# Patient Record
Sex: Female | Born: 1935 | Race: Black or African American | Hispanic: No | Marital: Single | Smoking: Never smoker
Health system: Southern US, Community
[De-identification: ages and names within clinical notes are randomized; demographics above are authoritative.]

## PROBLEM LIST (undated history)

## (undated) DIAGNOSIS — F09 Unspecified mental disorder due to known physiological condition: Secondary | ICD-10-CM

## (undated) DIAGNOSIS — I10 Essential (primary) hypertension: Secondary | ICD-10-CM

## (undated) DIAGNOSIS — I2510 Atherosclerotic heart disease of native coronary artery without angina pectoris: Secondary | ICD-10-CM

## (undated) DIAGNOSIS — D649 Anemia, unspecified: Secondary | ICD-10-CM

## (undated) DIAGNOSIS — F32A Depression, unspecified: Secondary | ICD-10-CM

## (undated) DIAGNOSIS — R55 Syncope and collapse: Secondary | ICD-10-CM

## (undated) DIAGNOSIS — F329 Major depressive disorder, single episode, unspecified: Secondary | ICD-10-CM

## (undated) DIAGNOSIS — C189 Malignant neoplasm of colon, unspecified: Secondary | ICD-10-CM

## (undated) DIAGNOSIS — E039 Hypothyroidism, unspecified: Secondary | ICD-10-CM

## (undated) DIAGNOSIS — I499 Cardiac arrhythmia, unspecified: Secondary | ICD-10-CM

## (undated) HISTORY — PX: COLON SURGERY: SHX602

## (undated) HISTORY — PX: CORONARY ARTERY BYPASS GRAFT: SHX141

## (undated) HISTORY — DX: Malignant neoplasm of colon, unspecified: C18.9

---

## 1987-09-10 DIAGNOSIS — C189 Malignant neoplasm of colon, unspecified: Secondary | ICD-10-CM

## 1987-09-10 HISTORY — DX: Malignant neoplasm of colon, unspecified: C18.9

## 2001-01-05 ENCOUNTER — Ambulatory Visit (HOSPITAL_COMMUNITY): Admission: RE | Admit: 2001-01-05 | Discharge: 2001-01-06 | Payer: Self-pay | Admitting: Ophthalmology

## 2001-02-06 ENCOUNTER — Emergency Department (HOSPITAL_COMMUNITY): Admission: EM | Admit: 2001-02-06 | Discharge: 2001-02-06 | Payer: Self-pay | Admitting: Emergency Medicine

## 2001-02-06 ENCOUNTER — Encounter: Payer: Self-pay | Admitting: *Deleted

## 2001-03-10 ENCOUNTER — Other Ambulatory Visit: Admission: RE | Admit: 2001-03-10 | Discharge: 2001-03-10 | Payer: Self-pay | Admitting: Family Medicine

## 2001-03-10 ENCOUNTER — Ambulatory Visit (HOSPITAL_COMMUNITY): Admission: RE | Admit: 2001-03-10 | Discharge: 2001-03-10 | Payer: Self-pay | Admitting: Family Medicine

## 2001-03-10 ENCOUNTER — Encounter: Payer: Self-pay | Admitting: Family Medicine

## 2001-04-24 ENCOUNTER — Ambulatory Visit (HOSPITAL_COMMUNITY): Admission: RE | Admit: 2001-04-24 | Discharge: 2001-04-24 | Payer: Self-pay | Admitting: Internal Medicine

## 2001-05-05 ENCOUNTER — Encounter (HOSPITAL_COMMUNITY): Admission: RE | Admit: 2001-05-05 | Discharge: 2001-06-04 | Payer: Self-pay | Admitting: Oncology

## 2001-05-05 ENCOUNTER — Encounter: Admission: RE | Admit: 2001-05-05 | Discharge: 2001-05-05 | Payer: Self-pay | Admitting: Oncology

## 2001-05-08 ENCOUNTER — Inpatient Hospital Stay (HOSPITAL_COMMUNITY): Admission: RE | Admit: 2001-05-08 | Discharge: 2001-05-09 | Payer: Self-pay | Admitting: Family Medicine

## 2001-05-14 ENCOUNTER — Inpatient Hospital Stay (HOSPITAL_COMMUNITY): Admission: AD | Admit: 2001-05-14 | Discharge: 2001-05-31 | Payer: Self-pay | Admitting: *Deleted

## 2001-05-14 ENCOUNTER — Encounter: Payer: Self-pay | Admitting: Cardiothoracic Surgery

## 2001-05-15 ENCOUNTER — Encounter: Payer: Self-pay | Admitting: Cardiothoracic Surgery

## 2001-05-16 ENCOUNTER — Encounter: Payer: Self-pay | Admitting: Cardiothoracic Surgery

## 2001-05-17 ENCOUNTER — Encounter: Payer: Self-pay | Admitting: Surgery

## 2001-05-18 ENCOUNTER — Encounter: Payer: Self-pay | Admitting: Surgery

## 2001-05-19 ENCOUNTER — Encounter: Payer: Self-pay | Admitting: Cardiothoracic Surgery

## 2001-05-25 ENCOUNTER — Encounter: Payer: Self-pay | Admitting: Thoracic Surgery (Cardiothoracic Vascular Surgery)

## 2001-05-29 ENCOUNTER — Encounter: Payer: Self-pay | Admitting: Cardiology

## 2001-06-09 ENCOUNTER — Encounter: Payer: Self-pay | Admitting: *Deleted

## 2001-06-09 ENCOUNTER — Emergency Department (HOSPITAL_COMMUNITY): Admission: EM | Admit: 2001-06-09 | Discharge: 2001-06-09 | Payer: Self-pay | Admitting: *Deleted

## 2001-06-15 ENCOUNTER — Ambulatory Visit (HOSPITAL_COMMUNITY): Admission: RE | Admit: 2001-06-15 | Discharge: 2001-06-15 | Payer: Self-pay | Admitting: Cardiology

## 2001-07-28 ENCOUNTER — Encounter: Payer: Self-pay | Admitting: Family Medicine

## 2001-07-28 ENCOUNTER — Encounter: Payer: Self-pay | Admitting: Emergency Medicine

## 2001-07-28 ENCOUNTER — Emergency Department (HOSPITAL_COMMUNITY): Admission: EM | Admit: 2001-07-28 | Discharge: 2001-07-28 | Payer: Self-pay | Admitting: Emergency Medicine

## 2001-07-28 ENCOUNTER — Ambulatory Visit (HOSPITAL_COMMUNITY): Admission: RE | Admit: 2001-07-28 | Discharge: 2001-07-28 | Payer: Self-pay | Admitting: Family Medicine

## 2001-09-03 ENCOUNTER — Encounter: Payer: Self-pay | Admitting: *Deleted

## 2001-09-04 ENCOUNTER — Encounter: Payer: Self-pay | Admitting: Internal Medicine

## 2001-09-04 ENCOUNTER — Inpatient Hospital Stay (HOSPITAL_COMMUNITY): Admission: EM | Admit: 2001-09-04 | Discharge: 2001-09-23 | Payer: Self-pay | Admitting: Internal Medicine

## 2001-09-04 ENCOUNTER — Encounter: Payer: Self-pay | Admitting: *Deleted

## 2001-09-05 ENCOUNTER — Encounter: Payer: Self-pay | Admitting: Pulmonary Disease

## 2001-09-06 ENCOUNTER — Encounter: Payer: Self-pay | Admitting: Internal Medicine

## 2001-09-07 ENCOUNTER — Encounter: Payer: Self-pay | Admitting: Internal Medicine

## 2001-09-08 ENCOUNTER — Encounter: Payer: Self-pay | Admitting: Internal Medicine

## 2001-09-12 ENCOUNTER — Encounter: Payer: Self-pay | Admitting: Internal Medicine

## 2001-09-13 ENCOUNTER — Encounter: Payer: Self-pay | Admitting: Internal Medicine

## 2001-09-14 ENCOUNTER — Encounter: Payer: Self-pay | Admitting: Internal Medicine

## 2001-09-21 ENCOUNTER — Encounter: Payer: Self-pay | Admitting: Internal Medicine

## 2001-09-25 ENCOUNTER — Emergency Department (HOSPITAL_COMMUNITY): Admission: EM | Admit: 2001-09-25 | Discharge: 2001-09-25 | Payer: Self-pay | Admitting: Emergency Medicine

## 2001-09-25 ENCOUNTER — Encounter: Payer: Self-pay | Admitting: Internal Medicine

## 2002-03-26 ENCOUNTER — Ambulatory Visit (HOSPITAL_COMMUNITY): Admission: RE | Admit: 2002-03-26 | Discharge: 2002-03-26 | Payer: Self-pay | Admitting: Internal Medicine

## 2002-03-26 ENCOUNTER — Encounter: Payer: Self-pay | Admitting: Internal Medicine

## 2002-05-12 ENCOUNTER — Encounter: Admission: RE | Admit: 2002-05-12 | Discharge: 2002-05-12 | Payer: Self-pay | Admitting: Oncology

## 2002-05-12 ENCOUNTER — Encounter (HOSPITAL_COMMUNITY): Admission: RE | Admit: 2002-05-12 | Discharge: 2002-06-11 | Payer: Self-pay | Admitting: Oncology

## 2003-05-18 ENCOUNTER — Encounter (HOSPITAL_COMMUNITY): Admission: RE | Admit: 2003-05-18 | Discharge: 2003-06-09 | Payer: Self-pay | Admitting: Oncology

## 2003-05-18 ENCOUNTER — Encounter: Admission: RE | Admit: 2003-05-18 | Discharge: 2003-05-18 | Payer: Self-pay | Admitting: Oncology

## 2003-12-06 ENCOUNTER — Ambulatory Visit (HOSPITAL_COMMUNITY): Admission: RE | Admit: 2003-12-06 | Discharge: 2003-12-06 | Payer: Self-pay | Admitting: Internal Medicine

## 2004-06-10 ENCOUNTER — Emergency Department (HOSPITAL_COMMUNITY): Admission: EM | Admit: 2004-06-10 | Discharge: 2004-06-10 | Payer: Self-pay | Admitting: Emergency Medicine

## 2005-08-14 ENCOUNTER — Ambulatory Visit (HOSPITAL_COMMUNITY): Admission: RE | Admit: 2005-08-14 | Discharge: 2005-08-14 | Payer: Self-pay | Admitting: Internal Medicine

## 2005-11-18 ENCOUNTER — Ambulatory Visit (HOSPITAL_COMMUNITY): Admission: RE | Admit: 2005-11-18 | Discharge: 2005-11-18 | Payer: Self-pay | Admitting: Internal Medicine

## 2005-11-25 ENCOUNTER — Ambulatory Visit (HOSPITAL_COMMUNITY): Admission: RE | Admit: 2005-11-25 | Discharge: 2005-11-25 | Payer: Self-pay | Admitting: Orthopaedic Surgery

## 2006-03-24 ENCOUNTER — Emergency Department (HOSPITAL_COMMUNITY): Admission: EM | Admit: 2006-03-24 | Discharge: 2006-03-24 | Payer: Self-pay | Admitting: Physician Assistant

## 2006-04-30 ENCOUNTER — Ambulatory Visit (HOSPITAL_COMMUNITY): Admission: RE | Admit: 2006-04-30 | Discharge: 2006-04-30 | Payer: Self-pay | Admitting: Internal Medicine

## 2006-08-08 ENCOUNTER — Inpatient Hospital Stay (HOSPITAL_COMMUNITY): Admission: EM | Admit: 2006-08-08 | Discharge: 2006-08-11 | Payer: Self-pay | Admitting: Emergency Medicine

## 2006-12-31 ENCOUNTER — Ambulatory Visit (HOSPITAL_COMMUNITY): Admission: RE | Admit: 2006-12-31 | Discharge: 2006-12-31 | Payer: Self-pay | Admitting: Internal Medicine

## 2007-03-26 ENCOUNTER — Ambulatory Visit: Payer: Self-pay | Admitting: Cardiology

## 2007-03-26 ENCOUNTER — Inpatient Hospital Stay (HOSPITAL_COMMUNITY): Admission: EM | Admit: 2007-03-26 | Discharge: 2007-03-31 | Payer: Self-pay | Admitting: Emergency Medicine

## 2007-03-31 ENCOUNTER — Inpatient Hospital Stay
Admission: AD | Admit: 2007-03-31 | Discharge: 2011-04-01 | Disposition: A | Discharge status: Nursing Home (Includes ICF) | Payer: Self-pay | Attending: Internal Medicine | Admitting: Internal Medicine

## 2007-06-22 ENCOUNTER — Ambulatory Visit (HOSPITAL_COMMUNITY): Admission: RE | Admit: 2007-06-22 | Discharge: 2007-06-22 | Payer: Self-pay | Admitting: Internal Medicine

## 2007-12-29 ENCOUNTER — Ambulatory Visit (HOSPITAL_COMMUNITY): Admission: RE | Admit: 2007-12-29 | Discharge: 2007-12-29 | Payer: Self-pay | Admitting: Internal Medicine

## 2008-02-02 ENCOUNTER — Ambulatory Visit (HOSPITAL_COMMUNITY): Admission: RE | Admit: 2008-02-02 | Discharge: 2008-02-02 | Payer: Self-pay | Admitting: Internal Medicine

## 2008-08-06 ENCOUNTER — Emergency Department (HOSPITAL_COMMUNITY): Admission: EM | Admit: 2008-08-06 | Discharge: 2008-08-07 | Payer: Self-pay | Admitting: Emergency Medicine

## 2008-10-25 ENCOUNTER — Ambulatory Visit (HOSPITAL_COMMUNITY): Admission: RE | Admit: 2008-10-25 | Discharge: 2008-10-25 | Payer: Self-pay | Admitting: Internal Medicine

## 2008-12-06 DIAGNOSIS — E119 Type 2 diabetes mellitus without complications: Secondary | ICD-10-CM | POA: Insufficient documentation

## 2008-12-06 DIAGNOSIS — R570 Cardiogenic shock: Secondary | ICD-10-CM | POA: Insufficient documentation

## 2008-12-06 DIAGNOSIS — K219 Gastro-esophageal reflux disease without esophagitis: Secondary | ICD-10-CM

## 2008-12-06 DIAGNOSIS — Z8614 Personal history of Methicillin resistant Staphylococcus aureus infection: Secondary | ICD-10-CM | POA: Insufficient documentation

## 2008-12-06 DIAGNOSIS — F0390 Unspecified dementia without behavioral disturbance: Secondary | ICD-10-CM

## 2008-12-06 DIAGNOSIS — B20 Human immunodeficiency virus [HIV] disease: Secondary | ICD-10-CM | POA: Insufficient documentation

## 2008-12-06 DIAGNOSIS — N189 Chronic kidney disease, unspecified: Secondary | ICD-10-CM

## 2008-12-06 DIAGNOSIS — J9600 Acute respiratory failure, unspecified whether with hypoxia or hypercapnia: Secondary | ICD-10-CM | POA: Insufficient documentation

## 2008-12-06 DIAGNOSIS — D126 Benign neoplasm of colon, unspecified: Secondary | ICD-10-CM

## 2008-12-06 DIAGNOSIS — I2510 Atherosclerotic heart disease of native coronary artery without angina pectoris: Secondary | ICD-10-CM

## 2008-12-06 DIAGNOSIS — F0280 Dementia in other diseases classified elsewhere without behavioral disturbance: Secondary | ICD-10-CM

## 2008-12-06 DIAGNOSIS — I6350 Cerebral infarction due to unspecified occlusion or stenosis of unspecified cerebral artery: Secondary | ICD-10-CM | POA: Insufficient documentation

## 2008-12-06 DIAGNOSIS — Z8719 Personal history of other diseases of the digestive system: Secondary | ICD-10-CM | POA: Insufficient documentation

## 2008-12-06 DIAGNOSIS — C9110 Chronic lymphocytic leukemia of B-cell type not having achieved remission: Secondary | ICD-10-CM | POA: Insufficient documentation

## 2008-12-06 DIAGNOSIS — I69991 Dysphagia following unspecified cerebrovascular disease: Secondary | ICD-10-CM | POA: Insufficient documentation

## 2008-12-06 DIAGNOSIS — Z85038 Personal history of other malignant neoplasm of large intestine: Secondary | ICD-10-CM | POA: Insufficient documentation

## 2008-12-07 ENCOUNTER — Ambulatory Visit: Payer: Self-pay | Admitting: Gastroenterology

## 2008-12-27 ENCOUNTER — Ambulatory Visit: Payer: Self-pay | Admitting: Gastroenterology

## 2008-12-27 ENCOUNTER — Encounter: Payer: Self-pay | Admitting: Gastroenterology

## 2008-12-27 ENCOUNTER — Ambulatory Visit (HOSPITAL_COMMUNITY): Admission: RE | Admit: 2008-12-27 | Discharge: 2008-12-27 | Payer: Self-pay | Admitting: Gastroenterology

## 2008-12-27 HISTORY — PX: COLONOSCOPY: SHX174

## 2009-03-01 ENCOUNTER — Ambulatory Visit (HOSPITAL_COMMUNITY): Admission: RE | Admit: 2009-03-01 | Discharge: 2009-03-01 | Payer: Self-pay | Admitting: Internal Medicine

## 2009-08-24 ENCOUNTER — Ambulatory Visit (HOSPITAL_COMMUNITY): Admission: RE | Admit: 2009-08-24 | Discharge: 2009-08-24 | Payer: Self-pay | Admitting: Internal Medicine

## 2010-09-12 LAB — GLUCOSE, CAPILLARY
Glucose-Capillary: 84 mg/dL (ref 70–99)
Glucose-Capillary: 155 mg/dL — ABNORMAL HIGH (ref 70–99)

## 2010-09-13 LAB — GLUCOSE, CAPILLARY
Glucose-Capillary: 102 mg/dL — ABNORMAL HIGH (ref 70–99)
Glucose-Capillary: 191 mg/dL — ABNORMAL HIGH (ref 70–99)

## 2010-09-14 LAB — GLUCOSE, CAPILLARY: Glucose-Capillary: 87 mg/dL (ref 70–99)

## 2010-09-24 LAB — GLUCOSE, CAPILLARY
Glucose-Capillary: 130 mg/dL — ABNORMAL HIGH (ref 70–99)
Glucose-Capillary: 69 mg/dL — ABNORMAL LOW (ref 70–99)
Glucose-Capillary: 231 mg/dL — ABNORMAL HIGH (ref 70–99)
Glucose-Capillary: 208 mg/dL — ABNORMAL HIGH (ref 70–99)
Glucose-Capillary: 167 mg/dL — ABNORMAL HIGH (ref 70–99)
Glucose-Capillary: 150 mg/dL — ABNORMAL HIGH (ref 70–99)
Glucose-Capillary: 104 mg/dL — ABNORMAL HIGH (ref 70–99)
Glucose-Capillary: 89 mg/dL (ref 70–99)
Glucose-Capillary: 178 mg/dL — ABNORMAL HIGH (ref 70–99)
Glucose-Capillary: 82 mg/dL (ref 70–99)
Glucose-Capillary: 132 mg/dL — ABNORMAL HIGH (ref 70–99)
Glucose-Capillary: 102 mg/dL — ABNORMAL HIGH (ref 70–99)
Glucose-Capillary: 161 mg/dL — ABNORMAL HIGH (ref 70–99)
Glucose-Capillary: 107 mg/dL — ABNORMAL HIGH (ref 70–99)
Glucose-Capillary: 152 mg/dL — ABNORMAL HIGH (ref 70–99)
Glucose-Capillary: 80 mg/dL (ref 70–99)
Glucose-Capillary: 124 mg/dL — ABNORMAL HIGH (ref 70–99)
Glucose-Capillary: 72 mg/dL (ref 70–99)
Glucose-Capillary: 88 mg/dL (ref 70–99)
Glucose-Capillary: 135 mg/dL — ABNORMAL HIGH (ref 70–99)

## 2010-09-26 LAB — GLUCOSE, CAPILLARY
Glucose-Capillary: 75 mg/dL (ref 70–99)
Glucose-Capillary: 72 mg/dL (ref 70–99)
Glucose-Capillary: 66 mg/dL — ABNORMAL LOW (ref 70–99)
Glucose-Capillary: 188 mg/dL — ABNORMAL HIGH (ref 70–99)

## 2010-09-30 ENCOUNTER — Encounter (HOSPITAL_BASED_OUTPATIENT_CLINIC_OR_DEPARTMENT_OTHER): Payer: Self-pay | Admitting: Internal Medicine

## 2010-10-01 LAB — GLUCOSE, CAPILLARY
Glucose-Capillary: 102 mg/dL — ABNORMAL HIGH (ref 70–99)
Glucose-Capillary: 134 mg/dL — ABNORMAL HIGH (ref 70–99)

## 2010-10-02 LAB — GLUCOSE, CAPILLARY
Glucose-Capillary: 159 mg/dL — ABNORMAL HIGH (ref 70–99)
Glucose-Capillary: 104 mg/dL — ABNORMAL HIGH (ref 70–99)
Glucose-Capillary: 102 mg/dL — ABNORMAL HIGH (ref 70–99)

## 2010-10-03 LAB — GLUCOSE, CAPILLARY
Glucose-Capillary: 68 mg/dL — ABNORMAL LOW (ref 70–99)
Glucose-Capillary: 136 mg/dL — ABNORMAL HIGH (ref 70–99)

## 2010-10-04 LAB — GLUCOSE, CAPILLARY
Glucose-Capillary: 119 mg/dL — ABNORMAL HIGH (ref 70–99)
Glucose-Capillary: 133 mg/dL — ABNORMAL HIGH (ref 70–99)

## 2010-10-05 LAB — GLUCOSE, CAPILLARY: Glucose-Capillary: 86 mg/dL (ref 70–99)

## 2010-10-07 LAB — GLUCOSE, CAPILLARY: Glucose-Capillary: 147 mg/dL — ABNORMAL HIGH (ref 70–99)

## 2010-10-08 LAB — GLUCOSE, CAPILLARY
Glucose-Capillary: 78 mg/dL (ref 70–99)
Glucose-Capillary: 137 mg/dL — ABNORMAL HIGH (ref 70–99)

## 2010-10-09 LAB — GLUCOSE, CAPILLARY: Glucose-Capillary: 108 mg/dL — ABNORMAL HIGH (ref 70–99)

## 2010-10-11 LAB — GLUCOSE, CAPILLARY: Glucose-Capillary: 155 mg/dL — ABNORMAL HIGH (ref 70–99)

## 2010-10-12 LAB — GLUCOSE, CAPILLARY: Glucose-Capillary: 128 mg/dL — ABNORMAL HIGH (ref 70–99)

## 2010-10-14 LAB — GLUCOSE, CAPILLARY: Glucose-Capillary: 85 mg/dL (ref 70–99)

## 2010-10-15 LAB — GLUCOSE, CAPILLARY
Glucose-Capillary: 95 mg/dL (ref 70–99)
Glucose-Capillary: 147 mg/dL — ABNORMAL HIGH (ref 70–99)

## 2010-10-16 LAB — GLUCOSE, CAPILLARY: Glucose-Capillary: 112 mg/dL — ABNORMAL HIGH (ref 70–99)

## 2010-10-17 LAB — GLUCOSE, CAPILLARY
Glucose-Capillary: 105 mg/dL — ABNORMAL HIGH (ref 70–99)
Glucose-Capillary: 148 mg/dL — ABNORMAL HIGH (ref 70–99)

## 2010-10-18 LAB — GLUCOSE, CAPILLARY

## 2010-10-19 LAB — GLUCOSE, CAPILLARY
Glucose-Capillary: 93 mg/dL (ref 70–99)
Glucose-Capillary: 95 mg/dL (ref 70–99)

## 2010-10-20 LAB — GLUCOSE, CAPILLARY
Glucose-Capillary: 84 mg/dL (ref 70–99)
Glucose-Capillary: 168 mg/dL — ABNORMAL HIGH (ref 70–99)

## 2010-10-21 LAB — GLUCOSE, CAPILLARY: Glucose-Capillary: 152 mg/dL — ABNORMAL HIGH (ref 70–99)

## 2010-10-22 LAB — GLUCOSE, CAPILLARY: Glucose-Capillary: 90 mg/dL (ref 70–99)

## 2010-10-23 LAB — GLUCOSE, CAPILLARY: Glucose-Capillary: 104 mg/dL — ABNORMAL HIGH (ref 70–99)

## 2010-10-24 LAB — GLUCOSE, CAPILLARY: Glucose-Capillary: 95 mg/dL (ref 70–99)

## 2010-10-26 LAB — GLUCOSE, CAPILLARY: Glucose-Capillary: 110 mg/dL — ABNORMAL HIGH (ref 70–99)

## 2010-10-27 LAB — GLUCOSE, CAPILLARY: Glucose-Capillary: 102 mg/dL — ABNORMAL HIGH (ref 70–99)

## 2010-10-28 LAB — GLUCOSE, CAPILLARY: Glucose-Capillary: 92 mg/dL (ref 70–99)

## 2010-10-30 LAB — GLUCOSE, CAPILLARY: Glucose-Capillary: 174 mg/dL — ABNORMAL HIGH (ref 70–99)

## 2010-10-31 LAB — GLUCOSE, CAPILLARY: Glucose-Capillary: 250 mg/dL — ABNORMAL HIGH (ref 70–99)

## 2010-11-01 LAB — GLUCOSE, CAPILLARY: Glucose-Capillary: 107 mg/dL — ABNORMAL HIGH (ref 70–99)

## 2010-11-02 LAB — GLUCOSE, CAPILLARY

## 2010-11-03 LAB — GLUCOSE, CAPILLARY
Glucose-Capillary: 99 mg/dL (ref 70–99)
Glucose-Capillary: 147 mg/dL — ABNORMAL HIGH (ref 70–99)

## 2010-11-04 LAB — GLUCOSE, CAPILLARY: Glucose-Capillary: 191 mg/dL — ABNORMAL HIGH (ref 70–99)

## 2010-11-05 LAB — GLUCOSE, CAPILLARY: Glucose-Capillary: 122 mg/dL — ABNORMAL HIGH (ref 70–99)

## 2010-11-06 LAB — GLUCOSE, CAPILLARY
Glucose-Capillary: 114 mg/dL — ABNORMAL HIGH (ref 70–99)
Glucose-Capillary: 97 mg/dL (ref 70–99)

## 2010-11-07 LAB — GLUCOSE, CAPILLARY
Glucose-Capillary: 91 mg/dL (ref 70–99)
Glucose-Capillary: 187 mg/dL — ABNORMAL HIGH (ref 70–99)

## 2010-11-08 LAB — GLUCOSE, CAPILLARY: Glucose-Capillary: 177 mg/dL — ABNORMAL HIGH (ref 70–99)

## 2010-11-09 LAB — GLUCOSE, CAPILLARY: Glucose-Capillary: 104 mg/dL — ABNORMAL HIGH (ref 70–99)

## 2010-11-11 LAB — GLUCOSE, CAPILLARY
Glucose-Capillary: 84 mg/dL (ref 70–99)
Glucose-Capillary: 152 mg/dL — ABNORMAL HIGH (ref 70–99)
Glucose-Capillary: 157 mg/dL — ABNORMAL HIGH (ref 70–99)

## 2010-11-12 LAB — GLUCOSE, CAPILLARY: Glucose-Capillary: 129 mg/dL — ABNORMAL HIGH (ref 70–99)

## 2010-11-13 LAB — GLUCOSE, CAPILLARY
Glucose-Capillary: 75 mg/dL (ref 70–99)
Glucose-Capillary: 135 mg/dL — ABNORMAL HIGH (ref 70–99)

## 2010-11-19 ENCOUNTER — Ambulatory Visit (HOSPITAL_COMMUNITY): Payer: Medicare Other | Attending: Internal Medicine

## 2010-11-19 DIAGNOSIS — R932 Abnormal findings on diagnostic imaging of liver and biliary tract: Secondary | ICD-10-CM | POA: Insufficient documentation

## 2010-11-19 DIAGNOSIS — K859 Acute pancreatitis without necrosis or infection, unspecified: Secondary | ICD-10-CM | POA: Insufficient documentation

## 2010-11-19 LAB — GLUCOSE, CAPILLARY
Glucose-Capillary: 111 mg/dL — ABNORMAL HIGH (ref 70–99)
Glucose-Capillary: 117 mg/dL — ABNORMAL HIGH (ref 70–99)
Glucose-Capillary: 134 mg/dL — ABNORMAL HIGH (ref 70–99)
Glucose-Capillary: 229 mg/dL — ABNORMAL HIGH (ref 70–99)
Glucose-Capillary: 79 mg/dL (ref 70–99)
Glucose-Capillary: 83 mg/dL (ref 70–99)
Glucose-Capillary: 81 mg/dL (ref 70–99)
Glucose-Capillary: 101 mg/dL — ABNORMAL HIGH (ref 70–99)
Glucose-Capillary: 108 mg/dL — ABNORMAL HIGH (ref 70–99)
Glucose-Capillary: 136 mg/dL — ABNORMAL HIGH (ref 70–99)
Glucose-Capillary: 141 mg/dL — ABNORMAL HIGH (ref 70–99)
Glucose-Capillary: 146 mg/dL — ABNORMAL HIGH (ref 70–99)
Glucose-Capillary: 153 mg/dL — ABNORMAL HIGH (ref 70–99)
Glucose-Capillary: 154 mg/dL — ABNORMAL HIGH (ref 70–99)
Glucose-Capillary: 160 mg/dL — ABNORMAL HIGH (ref 70–99)
Glucose-Capillary: 192 mg/dL — ABNORMAL HIGH (ref 70–99)
Glucose-Capillary: 202 mg/dL — ABNORMAL HIGH (ref 70–99)
Glucose-Capillary: 71 mg/dL (ref 70–99)
Glucose-Capillary: 72 mg/dL (ref 70–99)
Glucose-Capillary: 75 mg/dL (ref 70–99)
Glucose-Capillary: 76 mg/dL (ref 70–99)
Glucose-Capillary: 85 mg/dL (ref 70–99)
Glucose-Capillary: 86 mg/dL (ref 70–99)
Glucose-Capillary: 91 mg/dL (ref 70–99)
Glucose-Capillary: 96 mg/dL (ref 70–99)
Glucose-Capillary: 97 mg/dL (ref 70–99)
Glucose-Capillary: 106 mg/dL — ABNORMAL HIGH (ref 70–99)
Glucose-Capillary: 116 mg/dL — ABNORMAL HIGH (ref 70–99)
Glucose-Capillary: 116 mg/dL — ABNORMAL HIGH (ref 70–99)
Glucose-Capillary: 120 mg/dL — ABNORMAL HIGH (ref 70–99)
Glucose-Capillary: 158 mg/dL — ABNORMAL HIGH (ref 70–99)
Glucose-Capillary: 165 mg/dL — ABNORMAL HIGH (ref 70–99)
Glucose-Capillary: 175 mg/dL — ABNORMAL HIGH (ref 70–99)
Glucose-Capillary: 178 mg/dL — ABNORMAL HIGH (ref 70–99)
Glucose-Capillary: 229 mg/dL — ABNORMAL HIGH (ref 70–99)
Glucose-Capillary: 74 mg/dL (ref 70–99)
Glucose-Capillary: 80 mg/dL (ref 70–99)
Glucose-Capillary: 80 mg/dL (ref 70–99)
Glucose-Capillary: 89 mg/dL (ref 70–99)
Glucose-Capillary: 95 mg/dL (ref 70–99)

## 2010-11-20 ENCOUNTER — Other Ambulatory Visit (HOSPITAL_COMMUNITY): Payer: Self-pay

## 2010-11-20 ENCOUNTER — Ambulatory Visit (HOSPITAL_COMMUNITY): Payer: Self-pay

## 2010-11-20 LAB — GLUCOSE, CAPILLARY
Glucose-Capillary: 102 mg/dL — ABNORMAL HIGH (ref 70–99)
Glucose-Capillary: 104 mg/dL — ABNORMAL HIGH (ref 70–99)
Glucose-Capillary: 106 mg/dL — ABNORMAL HIGH (ref 70–99)
Glucose-Capillary: 109 mg/dL — ABNORMAL HIGH (ref 70–99)
Glucose-Capillary: 109 mg/dL — ABNORMAL HIGH (ref 70–99)
Glucose-Capillary: 111 mg/dL — ABNORMAL HIGH (ref 70–99)
Glucose-Capillary: 130 mg/dL — ABNORMAL HIGH (ref 70–99)
Glucose-Capillary: 142 mg/dL — ABNORMAL HIGH (ref 70–99)
Glucose-Capillary: 152 mg/dL — ABNORMAL HIGH (ref 70–99)
Glucose-Capillary: 154 mg/dL — ABNORMAL HIGH (ref 70–99)
Glucose-Capillary: 154 mg/dL — ABNORMAL HIGH (ref 70–99)
Glucose-Capillary: 171 mg/dL — ABNORMAL HIGH (ref 70–99)
Glucose-Capillary: 173 mg/dL — ABNORMAL HIGH (ref 70–99)
Glucose-Capillary: 173 mg/dL — ABNORMAL HIGH (ref 70–99)
Glucose-Capillary: 178 mg/dL — ABNORMAL HIGH (ref 70–99)
Glucose-Capillary: 187 mg/dL — ABNORMAL HIGH (ref 70–99)
Glucose-Capillary: 194 mg/dL — ABNORMAL HIGH (ref 70–99)
Glucose-Capillary: 229 mg/dL — ABNORMAL HIGH (ref 70–99)
Glucose-Capillary: 68 mg/dL — ABNORMAL LOW (ref 70–99)
Glucose-Capillary: 73 mg/dL (ref 70–99)
Glucose-Capillary: 74 mg/dL (ref 70–99)
Glucose-Capillary: 77 mg/dL (ref 70–99)
Glucose-Capillary: 77 mg/dL (ref 70–99)
Glucose-Capillary: 78 mg/dL (ref 70–99)
Glucose-Capillary: 78 mg/dL (ref 70–99)
Glucose-Capillary: 79 mg/dL (ref 70–99)
Glucose-Capillary: 79 mg/dL (ref 70–99)
Glucose-Capillary: 83 mg/dL (ref 70–99)
Glucose-Capillary: 88 mg/dL (ref 70–99)
Glucose-Capillary: 94 mg/dL (ref 70–99)
Glucose-Capillary: 94 mg/dL (ref 70–99)
Glucose-Capillary: 95 mg/dL (ref 70–99)
Glucose-Capillary: 97 mg/dL (ref 70–99)
Glucose-Capillary: 97 mg/dL (ref 70–99)
Glucose-Capillary: 98 mg/dL (ref 70–99)

## 2010-11-21 LAB — GLUCOSE, CAPILLARY
Glucose-Capillary: 106 mg/dL — ABNORMAL HIGH (ref 70–99)
Glucose-Capillary: 112 mg/dL — ABNORMAL HIGH (ref 70–99)
Glucose-Capillary: 117 mg/dL — ABNORMAL HIGH (ref 70–99)
Glucose-Capillary: 136 mg/dL — ABNORMAL HIGH (ref 70–99)
Glucose-Capillary: 144 mg/dL — ABNORMAL HIGH (ref 70–99)
Glucose-Capillary: 145 mg/dL — ABNORMAL HIGH (ref 70–99)
Glucose-Capillary: 150 mg/dL — ABNORMAL HIGH (ref 70–99)
Glucose-Capillary: 160 mg/dL — ABNORMAL HIGH (ref 70–99)
Glucose-Capillary: 175 mg/dL — ABNORMAL HIGH (ref 70–99)
Glucose-Capillary: 188 mg/dL — ABNORMAL HIGH (ref 70–99)
Glucose-Capillary: 210 mg/dL — ABNORMAL HIGH (ref 70–99)
Glucose-Capillary: 212 mg/dL — ABNORMAL HIGH (ref 70–99)
Glucose-Capillary: 282 mg/dL — ABNORMAL HIGH (ref 70–99)
Glucose-Capillary: 74 mg/dL (ref 70–99)
Glucose-Capillary: 87 mg/dL (ref 70–99)
Glucose-Capillary: 119 mg/dL — ABNORMAL HIGH (ref 70–99)
Glucose-Capillary: 121 mg/dL — ABNORMAL HIGH (ref 70–99)
Glucose-Capillary: 126 mg/dL — ABNORMAL HIGH (ref 70–99)
Glucose-Capillary: 135 mg/dL — ABNORMAL HIGH (ref 70–99)
Glucose-Capillary: 135 mg/dL — ABNORMAL HIGH (ref 70–99)
Glucose-Capillary: 136 mg/dL — ABNORMAL HIGH (ref 70–99)
Glucose-Capillary: 141 mg/dL — ABNORMAL HIGH (ref 70–99)
Glucose-Capillary: 142 mg/dL — ABNORMAL HIGH (ref 70–99)
Glucose-Capillary: 284 mg/dL — ABNORMAL HIGH (ref 70–99)
Glucose-Capillary: 79 mg/dL (ref 70–99)
Glucose-Capillary: 80 mg/dL (ref 70–99)
Glucose-Capillary: 82 mg/dL (ref 70–99)
Glucose-Capillary: 98 mg/dL (ref 70–99)

## 2010-11-22 LAB — GLUCOSE, CAPILLARY
Glucose-Capillary: 92 mg/dL (ref 70–99)
Glucose-Capillary: 103 mg/dL — ABNORMAL HIGH (ref 70–99)
Glucose-Capillary: 122 mg/dL — ABNORMAL HIGH (ref 70–99)
Glucose-Capillary: 135 mg/dL — ABNORMAL HIGH (ref 70–99)
Glucose-Capillary: 151 mg/dL — ABNORMAL HIGH (ref 70–99)
Glucose-Capillary: 158 mg/dL — ABNORMAL HIGH (ref 70–99)
Glucose-Capillary: 162 mg/dL — ABNORMAL HIGH (ref 70–99)
Glucose-Capillary: 207 mg/dL — ABNORMAL HIGH (ref 70–99)
Glucose-Capillary: 217 mg/dL — ABNORMAL HIGH (ref 70–99)
Glucose-Capillary: 259 mg/dL — ABNORMAL HIGH (ref 70–99)
Glucose-Capillary: 73 mg/dL (ref 70–99)
Glucose-Capillary: 78 mg/dL (ref 70–99)
Glucose-Capillary: 78 mg/dL (ref 70–99)
Glucose-Capillary: 86 mg/dL (ref 70–99)
Glucose-Capillary: 90 mg/dL (ref 70–99)
Glucose-Capillary: 92 mg/dL (ref 70–99)
Glucose-Capillary: 98 mg/dL (ref 70–99)
Glucose-Capillary: 101 mg/dL — ABNORMAL HIGH (ref 70–99)
Glucose-Capillary: 139 mg/dL — ABNORMAL HIGH (ref 70–99)
Glucose-Capillary: 183 mg/dL — ABNORMAL HIGH (ref 70–99)
Glucose-Capillary: 195 mg/dL — ABNORMAL HIGH (ref 70–99)
Glucose-Capillary: 197 mg/dL — ABNORMAL HIGH (ref 70–99)
Glucose-Capillary: 215 mg/dL — ABNORMAL HIGH (ref 70–99)
Glucose-Capillary: 77 mg/dL (ref 70–99)
Glucose-Capillary: 79 mg/dL (ref 70–99)
Glucose-Capillary: 82 mg/dL (ref 70–99)
Glucose-Capillary: 86 mg/dL (ref 70–99)
Glucose-Capillary: 90 mg/dL (ref 70–99)
Glucose-Capillary: 90 mg/dL (ref 70–99)
Glucose-Capillary: 90 mg/dL (ref 70–99)
Glucose-Capillary: 100 mg/dL — ABNORMAL HIGH (ref 70–99)
Glucose-Capillary: 105 mg/dL — ABNORMAL HIGH (ref 70–99)
Glucose-Capillary: 107 mg/dL — ABNORMAL HIGH (ref 70–99)
Glucose-Capillary: 108 mg/dL — ABNORMAL HIGH (ref 70–99)
Glucose-Capillary: 121 mg/dL — ABNORMAL HIGH (ref 70–99)
Glucose-Capillary: 123 mg/dL — ABNORMAL HIGH (ref 70–99)
Glucose-Capillary: 124 mg/dL — ABNORMAL HIGH (ref 70–99)
Glucose-Capillary: 129 mg/dL — ABNORMAL HIGH (ref 70–99)
Glucose-Capillary: 131 mg/dL — ABNORMAL HIGH (ref 70–99)
Glucose-Capillary: 140 mg/dL — ABNORMAL HIGH (ref 70–99)
Glucose-Capillary: 146 mg/dL — ABNORMAL HIGH (ref 70–99)
Glucose-Capillary: 179 mg/dL — ABNORMAL HIGH (ref 70–99)
Glucose-Capillary: 184 mg/dL — ABNORMAL HIGH (ref 70–99)
Glucose-Capillary: 78 mg/dL (ref 70–99)
Glucose-Capillary: 79 mg/dL (ref 70–99)
Glucose-Capillary: 89 mg/dL (ref 70–99)
Glucose-Capillary: 95 mg/dL (ref 70–99)
Glucose-Capillary: 95 mg/dL (ref 70–99)
Glucose-Capillary: 97 mg/dL (ref 70–99)

## 2010-11-23 LAB — GLUCOSE, CAPILLARY
Glucose-Capillary: 96 mg/dL (ref 70–99)
Glucose-Capillary: 102 mg/dL — ABNORMAL HIGH (ref 70–99)
Glucose-Capillary: 119 mg/dL — ABNORMAL HIGH (ref 70–99)
Glucose-Capillary: 130 mg/dL — ABNORMAL HIGH (ref 70–99)
Glucose-Capillary: 147 mg/dL — ABNORMAL HIGH (ref 70–99)
Glucose-Capillary: 157 mg/dL — ABNORMAL HIGH (ref 70–99)
Glucose-Capillary: 160 mg/dL — ABNORMAL HIGH (ref 70–99)
Glucose-Capillary: 64 mg/dL — ABNORMAL LOW (ref 70–99)
Glucose-Capillary: 66 mg/dL — ABNORMAL LOW (ref 70–99)
Glucose-Capillary: 69 mg/dL — ABNORMAL LOW (ref 70–99)
Glucose-Capillary: 73 mg/dL (ref 70–99)
Glucose-Capillary: 73 mg/dL (ref 70–99)
Glucose-Capillary: 77 mg/dL (ref 70–99)
Glucose-Capillary: 80 mg/dL (ref 70–99)
Glucose-Capillary: 82 mg/dL (ref 70–99)
Glucose-Capillary: 96 mg/dL (ref 70–99)
Glucose-Capillary: 98 mg/dL (ref 70–99)
Glucose-Capillary: 169 mg/dL — ABNORMAL HIGH (ref 70–99)

## 2010-11-24 LAB — GLUCOSE, CAPILLARY
Glucose-Capillary: 102 mg/dL — ABNORMAL HIGH (ref 70–99)
Glucose-Capillary: 106 mg/dL — ABNORMAL HIGH (ref 70–99)
Glucose-Capillary: 114 mg/dL — ABNORMAL HIGH (ref 70–99)
Glucose-Capillary: 126 mg/dL — ABNORMAL HIGH (ref 70–99)
Glucose-Capillary: 145 mg/dL — ABNORMAL HIGH (ref 70–99)
Glucose-Capillary: 146 mg/dL — ABNORMAL HIGH (ref 70–99)
Glucose-Capillary: 149 mg/dL — ABNORMAL HIGH (ref 70–99)
Glucose-Capillary: 158 mg/dL — ABNORMAL HIGH (ref 70–99)
Glucose-Capillary: 251 mg/dL — ABNORMAL HIGH (ref 70–99)
Glucose-Capillary: 254 mg/dL — ABNORMAL HIGH (ref 70–99)
Glucose-Capillary: 58 mg/dL — ABNORMAL LOW (ref 70–99)
Glucose-Capillary: 78 mg/dL (ref 70–99)
Glucose-Capillary: 84 mg/dL (ref 70–99)
Glucose-Capillary: 84 mg/dL (ref 70–99)
Glucose-Capillary: 84 mg/dL (ref 70–99)
Glucose-Capillary: 85 mg/dL (ref 70–99)
Glucose-Capillary: 95 mg/dL (ref 70–99)
Glucose-Capillary: 79 mg/dL (ref 70–99)
Glucose-Capillary: 167 mg/dL — ABNORMAL HIGH (ref 70–99)
Glucose-Capillary: 105 mg/dL — ABNORMAL HIGH (ref 70–99)
Glucose-Capillary: 105 mg/dL — ABNORMAL HIGH (ref 70–99)
Glucose-Capillary: 108 mg/dL — ABNORMAL HIGH (ref 70–99)
Glucose-Capillary: 118 mg/dL — ABNORMAL HIGH (ref 70–99)
Glucose-Capillary: 120 mg/dL — ABNORMAL HIGH (ref 70–99)
Glucose-Capillary: 139 mg/dL — ABNORMAL HIGH (ref 70–99)
Glucose-Capillary: 143 mg/dL — ABNORMAL HIGH (ref 70–99)
Glucose-Capillary: 153 mg/dL — ABNORMAL HIGH (ref 70–99)
Glucose-Capillary: 191 mg/dL — ABNORMAL HIGH (ref 70–99)
Glucose-Capillary: 214 mg/dL — ABNORMAL HIGH (ref 70–99)
Glucose-Capillary: 217 mg/dL — ABNORMAL HIGH (ref 70–99)
Glucose-Capillary: 221 mg/dL — ABNORMAL HIGH (ref 70–99)
Glucose-Capillary: 62 mg/dL — ABNORMAL LOW (ref 70–99)
Glucose-Capillary: 68 mg/dL — ABNORMAL LOW (ref 70–99)
Glucose-Capillary: 71 mg/dL (ref 70–99)
Glucose-Capillary: 79 mg/dL (ref 70–99)
Glucose-Capillary: 96 mg/dL (ref 70–99)

## 2010-11-25 LAB — GLUCOSE, CAPILLARY
Glucose-Capillary: 95 mg/dL (ref 70–99)
Glucose-Capillary: 106 mg/dL — ABNORMAL HIGH (ref 70–99)
Glucose-Capillary: 123 mg/dL — ABNORMAL HIGH (ref 70–99)
Glucose-Capillary: 130 mg/dL — ABNORMAL HIGH (ref 70–99)
Glucose-Capillary: 160 mg/dL — ABNORMAL HIGH (ref 70–99)
Glucose-Capillary: 164 mg/dL — ABNORMAL HIGH (ref 70–99)
Glucose-Capillary: 173 mg/dL — ABNORMAL HIGH (ref 70–99)
Glucose-Capillary: 202 mg/dL — ABNORMAL HIGH (ref 70–99)
Glucose-Capillary: 212 mg/dL — ABNORMAL HIGH (ref 70–99)
Glucose-Capillary: 254 mg/dL — ABNORMAL HIGH (ref 70–99)
Glucose-Capillary: 64 mg/dL — ABNORMAL LOW (ref 70–99)
Glucose-Capillary: 77 mg/dL (ref 70–99)
Glucose-Capillary: 78 mg/dL (ref 70–99)
Glucose-Capillary: 82 mg/dL (ref 70–99)
Glucose-Capillary: 101 mg/dL — ABNORMAL HIGH (ref 70–99)
Glucose-Capillary: 120 mg/dL — ABNORMAL HIGH (ref 70–99)
Glucose-Capillary: 124 mg/dL — ABNORMAL HIGH (ref 70–99)
Glucose-Capillary: 124 mg/dL — ABNORMAL HIGH (ref 70–99)
Glucose-Capillary: 135 mg/dL — ABNORMAL HIGH (ref 70–99)
Glucose-Capillary: 164 mg/dL — ABNORMAL HIGH (ref 70–99)
Glucose-Capillary: 166 mg/dL — ABNORMAL HIGH (ref 70–99)
Glucose-Capillary: 169 mg/dL — ABNORMAL HIGH (ref 70–99)
Glucose-Capillary: 181 mg/dL — ABNORMAL HIGH (ref 70–99)
Glucose-Capillary: 181 mg/dL — ABNORMAL HIGH (ref 70–99)
Glucose-Capillary: 184 mg/dL — ABNORMAL HIGH (ref 70–99)
Glucose-Capillary: 186 mg/dL — ABNORMAL HIGH (ref 70–99)
Glucose-Capillary: 199 mg/dL — ABNORMAL HIGH (ref 70–99)
Glucose-Capillary: 69 mg/dL — ABNORMAL LOW (ref 70–99)
Glucose-Capillary: 70 mg/dL (ref 70–99)
Glucose-Capillary: 74 mg/dL (ref 70–99)
Glucose-Capillary: 77 mg/dL (ref 70–99)
Glucose-Capillary: 84 mg/dL (ref 70–99)
Glucose-Capillary: 90 mg/dL (ref 70–99)
Glucose-Capillary: 114 mg/dL — ABNORMAL HIGH (ref 70–99)
Glucose-Capillary: 115 mg/dL — ABNORMAL HIGH (ref 70–99)
Glucose-Capillary: 126 mg/dL — ABNORMAL HIGH (ref 70–99)
Glucose-Capillary: 147 mg/dL — ABNORMAL HIGH (ref 70–99)
Glucose-Capillary: 154 mg/dL — ABNORMAL HIGH (ref 70–99)
Glucose-Capillary: 160 mg/dL — ABNORMAL HIGH (ref 70–99)
Glucose-Capillary: 172 mg/dL — ABNORMAL HIGH (ref 70–99)
Glucose-Capillary: 194 mg/dL — ABNORMAL HIGH (ref 70–99)
Glucose-Capillary: 64 mg/dL — ABNORMAL LOW (ref 70–99)
Glucose-Capillary: 66 mg/dL — ABNORMAL LOW (ref 70–99)
Glucose-Capillary: 67 mg/dL — ABNORMAL LOW (ref 70–99)
Glucose-Capillary: 68 mg/dL — ABNORMAL LOW (ref 70–99)
Glucose-Capillary: 73 mg/dL (ref 70–99)
Glucose-Capillary: 75 mg/dL (ref 70–99)
Glucose-Capillary: 77 mg/dL (ref 70–99)
Glucose-Capillary: 79 mg/dL (ref 70–99)
Glucose-Capillary: 81 mg/dL (ref 70–99)
Glucose-Capillary: 84 mg/dL (ref 70–99)
Glucose-Capillary: 85 mg/dL (ref 70–99)

## 2010-11-26 LAB — GLUCOSE, CAPILLARY
Glucose-Capillary: 102 mg/dL — ABNORMAL HIGH (ref 70–99)
Glucose-Capillary: 127 mg/dL — ABNORMAL HIGH (ref 70–99)
Glucose-Capillary: 143 mg/dL — ABNORMAL HIGH (ref 70–99)
Glucose-Capillary: 166 mg/dL — ABNORMAL HIGH (ref 70–99)
Glucose-Capillary: 179 mg/dL — ABNORMAL HIGH (ref 70–99)
Glucose-Capillary: 197 mg/dL — ABNORMAL HIGH (ref 70–99)
Glucose-Capillary: 62 mg/dL — ABNORMAL LOW (ref 70–99)
Glucose-Capillary: 64 mg/dL — ABNORMAL LOW (ref 70–99)
Glucose-Capillary: 68 mg/dL — ABNORMAL LOW (ref 70–99)
Glucose-Capillary: 68 mg/dL — ABNORMAL LOW (ref 70–99)
Glucose-Capillary: 79 mg/dL (ref 70–99)
Glucose-Capillary: 95 mg/dL (ref 70–99)
Glucose-Capillary: 110 mg/dL — ABNORMAL HIGH (ref 70–99)
Glucose-Capillary: 149 mg/dL — ABNORMAL HIGH (ref 70–99)
Glucose-Capillary: 149 mg/dL — ABNORMAL HIGH (ref 70–99)
Glucose-Capillary: 163 mg/dL — ABNORMAL HIGH (ref 70–99)
Glucose-Capillary: 177 mg/dL — ABNORMAL HIGH (ref 70–99)
Glucose-Capillary: 185 mg/dL — ABNORMAL HIGH (ref 70–99)
Glucose-Capillary: 218 mg/dL — ABNORMAL HIGH (ref 70–99)
Glucose-Capillary: 65 mg/dL — ABNORMAL LOW (ref 70–99)
Glucose-Capillary: 69 mg/dL — ABNORMAL LOW (ref 70–99)
Glucose-Capillary: 76 mg/dL (ref 70–99)
Glucose-Capillary: 80 mg/dL (ref 70–99)
Glucose-Capillary: 81 mg/dL (ref 70–99)
Glucose-Capillary: 83 mg/dL (ref 70–99)
Glucose-Capillary: 127 mg/dL — ABNORMAL HIGH (ref 70–99)

## 2010-11-27 LAB — GLUCOSE, CAPILLARY
Glucose-Capillary: 110 mg/dL — ABNORMAL HIGH (ref 70–99)
Glucose-Capillary: 113 mg/dL — ABNORMAL HIGH (ref 70–99)
Glucose-Capillary: 118 mg/dL — ABNORMAL HIGH (ref 70–99)
Glucose-Capillary: 123 mg/dL — ABNORMAL HIGH (ref 70–99)
Glucose-Capillary: 142 mg/dL — ABNORMAL HIGH (ref 70–99)
Glucose-Capillary: 204 mg/dL — ABNORMAL HIGH (ref 70–99)
Glucose-Capillary: 250 mg/dL — ABNORMAL HIGH (ref 70–99)
Glucose-Capillary: 62 mg/dL — ABNORMAL LOW (ref 70–99)
Glucose-Capillary: 68 mg/dL — ABNORMAL LOW (ref 70–99)
Glucose-Capillary: 70 mg/dL (ref 70–99)
Glucose-Capillary: 72 mg/dL (ref 70–99)
Glucose-Capillary: 73 mg/dL (ref 70–99)
Glucose-Capillary: 76 mg/dL (ref 70–99)
Glucose-Capillary: 79 mg/dL (ref 70–99)
Glucose-Capillary: 81 mg/dL (ref 70–99)
Glucose-Capillary: 95 mg/dL (ref 70–99)
Glucose-Capillary: 102 mg/dL — ABNORMAL HIGH (ref 70–99)
Glucose-Capillary: 106 mg/dL — ABNORMAL HIGH (ref 70–99)
Glucose-Capillary: 113 mg/dL — ABNORMAL HIGH (ref 70–99)
Glucose-Capillary: 119 mg/dL — ABNORMAL HIGH (ref 70–99)
Glucose-Capillary: 127 mg/dL — ABNORMAL HIGH (ref 70–99)
Glucose-Capillary: 142 mg/dL — ABNORMAL HIGH (ref 70–99)
Glucose-Capillary: 145 mg/dL — ABNORMAL HIGH (ref 70–99)
Glucose-Capillary: 155 mg/dL — ABNORMAL HIGH (ref 70–99)
Glucose-Capillary: 157 mg/dL — ABNORMAL HIGH (ref 70–99)
Glucose-Capillary: 157 mg/dL — ABNORMAL HIGH (ref 70–99)
Glucose-Capillary: 167 mg/dL — ABNORMAL HIGH (ref 70–99)
Glucose-Capillary: 181 mg/dL — ABNORMAL HIGH (ref 70–99)
Glucose-Capillary: 192 mg/dL — ABNORMAL HIGH (ref 70–99)
Glucose-Capillary: 59 mg/dL — ABNORMAL LOW (ref 70–99)
Glucose-Capillary: 64 mg/dL — ABNORMAL LOW (ref 70–99)
Glucose-Capillary: 68 mg/dL — ABNORMAL LOW (ref 70–99)
Glucose-Capillary: 73 mg/dL (ref 70–99)
Glucose-Capillary: 77 mg/dL (ref 70–99)
Glucose-Capillary: 79 mg/dL (ref 70–99)
Glucose-Capillary: 88 mg/dL (ref 70–99)
Glucose-Capillary: 94 mg/dL (ref 70–99)
Glucose-Capillary: 97 mg/dL (ref 70–99)

## 2010-11-28 LAB — GLUCOSE, CAPILLARY
Glucose-Capillary: 69 mg/dL — ABNORMAL LOW (ref 70–99)
Glucose-Capillary: 103 mg/dL — ABNORMAL HIGH (ref 70–99)
Glucose-Capillary: 105 mg/dL — ABNORMAL HIGH (ref 70–99)
Glucose-Capillary: 110 mg/dL — ABNORMAL HIGH (ref 70–99)
Glucose-Capillary: 115 mg/dL — ABNORMAL HIGH (ref 70–99)
Glucose-Capillary: 116 mg/dL — ABNORMAL HIGH (ref 70–99)
Glucose-Capillary: 118 mg/dL — ABNORMAL HIGH (ref 70–99)
Glucose-Capillary: 120 mg/dL — ABNORMAL HIGH (ref 70–99)
Glucose-Capillary: 121 mg/dL — ABNORMAL HIGH (ref 70–99)
Glucose-Capillary: 130 mg/dL — ABNORMAL HIGH (ref 70–99)
Glucose-Capillary: 138 mg/dL — ABNORMAL HIGH (ref 70–99)
Glucose-Capillary: 144 mg/dL — ABNORMAL HIGH (ref 70–99)
Glucose-Capillary: 144 mg/dL — ABNORMAL HIGH (ref 70–99)
Glucose-Capillary: 144 mg/dL — ABNORMAL HIGH (ref 70–99)
Glucose-Capillary: 146 mg/dL — ABNORMAL HIGH (ref 70–99)
Glucose-Capillary: 156 mg/dL — ABNORMAL HIGH (ref 70–99)
Glucose-Capillary: 169 mg/dL — ABNORMAL HIGH (ref 70–99)
Glucose-Capillary: 171 mg/dL — ABNORMAL HIGH (ref 70–99)
Glucose-Capillary: 173 mg/dL — ABNORMAL HIGH (ref 70–99)
Glucose-Capillary: 174 mg/dL — ABNORMAL HIGH (ref 70–99)
Glucose-Capillary: 180 mg/dL — ABNORMAL HIGH (ref 70–99)
Glucose-Capillary: 185 mg/dL — ABNORMAL HIGH (ref 70–99)
Glucose-Capillary: 199 mg/dL — ABNORMAL HIGH (ref 70–99)
Glucose-Capillary: 222 mg/dL — ABNORMAL HIGH (ref 70–99)
Glucose-Capillary: 75 mg/dL (ref 70–99)
Glucose-Capillary: 77 mg/dL (ref 70–99)
Glucose-Capillary: 82 mg/dL (ref 70–99)
Glucose-Capillary: 89 mg/dL (ref 70–99)
Glucose-Capillary: 93 mg/dL (ref 70–99)
Glucose-Capillary: 98 mg/dL (ref 70–99)
Glucose-Capillary: 99 mg/dL (ref 70–99)
Glucose-Capillary: 111 mg/dL — ABNORMAL HIGH (ref 70–99)
Glucose-Capillary: 124 mg/dL — ABNORMAL HIGH (ref 70–99)
Glucose-Capillary: 126 mg/dL — ABNORMAL HIGH (ref 70–99)
Glucose-Capillary: 127 mg/dL — ABNORMAL HIGH (ref 70–99)
Glucose-Capillary: 128 mg/dL — ABNORMAL HIGH (ref 70–99)
Glucose-Capillary: 144 mg/dL — ABNORMAL HIGH (ref 70–99)
Glucose-Capillary: 145 mg/dL — ABNORMAL HIGH (ref 70–99)
Glucose-Capillary: 168 mg/dL — ABNORMAL HIGH (ref 70–99)
Glucose-Capillary: 187 mg/dL — ABNORMAL HIGH (ref 70–99)
Glucose-Capillary: 191 mg/dL — ABNORMAL HIGH (ref 70–99)
Glucose-Capillary: 60 mg/dL — ABNORMAL LOW (ref 70–99)
Glucose-Capillary: 65 mg/dL — ABNORMAL LOW (ref 70–99)
Glucose-Capillary: 65 mg/dL — ABNORMAL LOW (ref 70–99)
Glucose-Capillary: 68 mg/dL — ABNORMAL LOW (ref 70–99)
Glucose-Capillary: 71 mg/dL (ref 70–99)
Glucose-Capillary: 91 mg/dL (ref 70–99)
Glucose-Capillary: 92 mg/dL (ref 70–99)
Glucose-Capillary: 99 mg/dL (ref 70–99)
Glucose-Capillary: 99 mg/dL (ref 70–99)
Glucose-Capillary: 145 mg/dL — ABNORMAL HIGH (ref 70–99)

## 2010-11-29 LAB — GLUCOSE, CAPILLARY
Glucose-Capillary: 113 mg/dL — ABNORMAL HIGH (ref 70–99)
Glucose-Capillary: 182 mg/dL — ABNORMAL HIGH (ref 70–99)

## 2010-11-30 LAB — GLUCOSE, CAPILLARY
Glucose-Capillary: 119 mg/dL — ABNORMAL HIGH (ref 70–99)
Glucose-Capillary: 100 mg/dL — ABNORMAL HIGH (ref 70–99)
Glucose-Capillary: 103 mg/dL — ABNORMAL HIGH (ref 70–99)
Glucose-Capillary: 104 mg/dL — ABNORMAL HIGH (ref 70–99)
Glucose-Capillary: 105 mg/dL — ABNORMAL HIGH (ref 70–99)
Glucose-Capillary: 107 mg/dL — ABNORMAL HIGH (ref 70–99)
Glucose-Capillary: 114 mg/dL — ABNORMAL HIGH (ref 70–99)
Glucose-Capillary: 116 mg/dL — ABNORMAL HIGH (ref 70–99)
Glucose-Capillary: 121 mg/dL — ABNORMAL HIGH (ref 70–99)
Glucose-Capillary: 131 mg/dL — ABNORMAL HIGH (ref 70–99)
Glucose-Capillary: 134 mg/dL — ABNORMAL HIGH (ref 70–99)
Glucose-Capillary: 137 mg/dL — ABNORMAL HIGH (ref 70–99)
Glucose-Capillary: 137 mg/dL — ABNORMAL HIGH (ref 70–99)
Glucose-Capillary: 146 mg/dL — ABNORMAL HIGH (ref 70–99)
Glucose-Capillary: 148 mg/dL — ABNORMAL HIGH (ref 70–99)
Glucose-Capillary: 149 mg/dL — ABNORMAL HIGH (ref 70–99)
Glucose-Capillary: 151 mg/dL — ABNORMAL HIGH (ref 70–99)
Glucose-Capillary: 152 mg/dL — ABNORMAL HIGH (ref 70–99)
Glucose-Capillary: 191 mg/dL — ABNORMAL HIGH (ref 70–99)
Glucose-Capillary: 238 mg/dL — ABNORMAL HIGH (ref 70–99)
Glucose-Capillary: 64 mg/dL — ABNORMAL LOW (ref 70–99)
Glucose-Capillary: 66 mg/dL — ABNORMAL LOW (ref 70–99)
Glucose-Capillary: 66 mg/dL — ABNORMAL LOW (ref 70–99)
Glucose-Capillary: 73 mg/dL (ref 70–99)
Glucose-Capillary: 74 mg/dL (ref 70–99)
Glucose-Capillary: 76 mg/dL (ref 70–99)
Glucose-Capillary: 78 mg/dL (ref 70–99)
Glucose-Capillary: 79 mg/dL (ref 70–99)
Glucose-Capillary: 81 mg/dL (ref 70–99)
Glucose-Capillary: 85 mg/dL (ref 70–99)
Glucose-Capillary: 85 mg/dL (ref 70–99)
Glucose-Capillary: 91 mg/dL (ref 70–99)
Glucose-Capillary: 92 mg/dL (ref 70–99)
Glucose-Capillary: 92 mg/dL (ref 70–99)
Glucose-Capillary: 97 mg/dL (ref 70–99)

## 2010-12-01 LAB — GLUCOSE, CAPILLARY: Glucose-Capillary: 61 mg/dL — ABNORMAL LOW (ref 70–99)

## 2010-12-02 LAB — GLUCOSE, CAPILLARY: Glucose-Capillary: 133 mg/dL — ABNORMAL HIGH (ref 70–99)

## 2010-12-03 LAB — GLUCOSE, CAPILLARY
Glucose-Capillary: 136 mg/dL — ABNORMAL HIGH (ref 70–99)
Glucose-Capillary: 204 mg/dL — ABNORMAL HIGH (ref 70–99)

## 2010-12-04 LAB — GLUCOSE, CAPILLARY
Glucose-Capillary: 98 mg/dL (ref 70–99)
Glucose-Capillary: 148 mg/dL — ABNORMAL HIGH (ref 70–99)

## 2010-12-06 LAB — GLUCOSE, CAPILLARY: Glucose-Capillary: 88 mg/dL (ref 70–99)

## 2010-12-07 LAB — GLUCOSE, CAPILLARY
Glucose-Capillary: 86 mg/dL (ref 70–99)
Glucose-Capillary: 103 mg/dL — ABNORMAL HIGH (ref 70–99)

## 2010-12-10 LAB — GLUCOSE, CAPILLARY
Glucose-Capillary: 82 mg/dL (ref 70–99)
Glucose-Capillary: 103 mg/dL — ABNORMAL HIGH (ref 70–99)
Glucose-Capillary: 114 mg/dL — ABNORMAL HIGH (ref 70–99)
Glucose-Capillary: 117 mg/dL — ABNORMAL HIGH (ref 70–99)
Glucose-Capillary: 137 mg/dL — ABNORMAL HIGH (ref 70–99)
Glucose-Capillary: 140 mg/dL — ABNORMAL HIGH (ref 70–99)
Glucose-Capillary: 176 mg/dL — ABNORMAL HIGH (ref 70–99)
Glucose-Capillary: 195 mg/dL — ABNORMAL HIGH (ref 70–99)
Glucose-Capillary: 198 mg/dL — ABNORMAL HIGH (ref 70–99)
Glucose-Capillary: 202 mg/dL — ABNORMAL HIGH (ref 70–99)
Glucose-Capillary: 240 mg/dL — ABNORMAL HIGH (ref 70–99)
Glucose-Capillary: 73 mg/dL (ref 70–99)
Glucose-Capillary: 77 mg/dL (ref 70–99)
Glucose-Capillary: 82 mg/dL (ref 70–99)
Glucose-Capillary: 86 mg/dL (ref 70–99)
Glucose-Capillary: 91 mg/dL (ref 70–99)
Glucose-Capillary: 92 mg/dL (ref 70–99)
Glucose-Capillary: 149 mg/dL — ABNORMAL HIGH (ref 70–99)

## 2010-12-11 LAB — GLUCOSE, CAPILLARY
Glucose-Capillary: 85 mg/dL (ref 70–99)
Glucose-Capillary: 100 mg/dL — ABNORMAL HIGH (ref 70–99)
Glucose-Capillary: 116 mg/dL — ABNORMAL HIGH (ref 70–99)
Glucose-Capillary: 117 mg/dL — ABNORMAL HIGH (ref 70–99)
Glucose-Capillary: 123 mg/dL — ABNORMAL HIGH (ref 70–99)
Glucose-Capillary: 127 mg/dL — ABNORMAL HIGH (ref 70–99)
Glucose-Capillary: 158 mg/dL — ABNORMAL HIGH (ref 70–99)
Glucose-Capillary: 164 mg/dL — ABNORMAL HIGH (ref 70–99)
Glucose-Capillary: 190 mg/dL — ABNORMAL HIGH (ref 70–99)
Glucose-Capillary: 274 mg/dL — ABNORMAL HIGH (ref 70–99)
Glucose-Capillary: 288 mg/dL — ABNORMAL HIGH (ref 70–99)
Glucose-Capillary: 59 mg/dL — ABNORMAL LOW (ref 70–99)
Glucose-Capillary: 62 mg/dL — ABNORMAL LOW (ref 70–99)
Glucose-Capillary: 68 mg/dL — ABNORMAL LOW (ref 70–99)
Glucose-Capillary: 68 mg/dL — ABNORMAL LOW (ref 70–99)
Glucose-Capillary: 74 mg/dL (ref 70–99)
Glucose-Capillary: 78 mg/dL (ref 70–99)
Glucose-Capillary: 78 mg/dL (ref 70–99)
Glucose-Capillary: 86 mg/dL (ref 70–99)
Glucose-Capillary: 88 mg/dL (ref 70–99)
Glucose-Capillary: 99 mg/dL (ref 70–99)

## 2010-12-12 LAB — GLUCOSE, CAPILLARY
Glucose-Capillary: 106 mg/dL — ABNORMAL HIGH (ref 70–99)
Glucose-Capillary: 114 mg/dL — ABNORMAL HIGH (ref 70–99)
Glucose-Capillary: 115 mg/dL — ABNORMAL HIGH (ref 70–99)
Glucose-Capillary: 179 mg/dL — ABNORMAL HIGH (ref 70–99)
Glucose-Capillary: 196 mg/dL — ABNORMAL HIGH (ref 70–99)
Glucose-Capillary: 217 mg/dL — ABNORMAL HIGH (ref 70–99)
Glucose-Capillary: 65 mg/dL — ABNORMAL LOW (ref 70–99)
Glucose-Capillary: 69 mg/dL — ABNORMAL LOW (ref 70–99)
Glucose-Capillary: 75 mg/dL (ref 70–99)
Glucose-Capillary: 81 mg/dL (ref 70–99)
Glucose-Capillary: 88 mg/dL (ref 70–99)
Glucose-Capillary: 89 mg/dL (ref 70–99)
Glucose-Capillary: 106 mg/dL — ABNORMAL HIGH (ref 70–99)
Glucose-Capillary: 119 mg/dL — ABNORMAL HIGH (ref 70–99)
Glucose-Capillary: 125 mg/dL — ABNORMAL HIGH (ref 70–99)
Glucose-Capillary: 159 mg/dL — ABNORMAL HIGH (ref 70–99)
Glucose-Capillary: 178 mg/dL — ABNORMAL HIGH (ref 70–99)
Glucose-Capillary: 179 mg/dL — ABNORMAL HIGH (ref 70–99)
Glucose-Capillary: 185 mg/dL — ABNORMAL HIGH (ref 70–99)
Glucose-Capillary: 236 mg/dL — ABNORMAL HIGH (ref 70–99)
Glucose-Capillary: 77 mg/dL (ref 70–99)
Glucose-Capillary: 78 mg/dL (ref 70–99)
Glucose-Capillary: 81 mg/dL (ref 70–99)
Glucose-Capillary: 96 mg/dL (ref 70–99)
Glucose-Capillary: 161 mg/dL — ABNORMAL HIGH (ref 70–99)

## 2010-12-13 LAB — GLUCOSE, CAPILLARY
Glucose-Capillary: 117 mg/dL — ABNORMAL HIGH (ref 70–99)
Glucose-Capillary: 105 mg/dL — ABNORMAL HIGH (ref 70–99)
Glucose-Capillary: 110 mg/dL — ABNORMAL HIGH (ref 70–99)
Glucose-Capillary: 112 mg/dL — ABNORMAL HIGH (ref 70–99)
Glucose-Capillary: 122 mg/dL — ABNORMAL HIGH (ref 70–99)
Glucose-Capillary: 136 mg/dL — ABNORMAL HIGH (ref 70–99)
Glucose-Capillary: 163 mg/dL — ABNORMAL HIGH (ref 70–99)
Glucose-Capillary: 194 mg/dL — ABNORMAL HIGH (ref 70–99)
Glucose-Capillary: 195 mg/dL — ABNORMAL HIGH (ref 70–99)
Glucose-Capillary: 271 mg/dL — ABNORMAL HIGH (ref 70–99)
Glucose-Capillary: 75 mg/dL (ref 70–99)
Glucose-Capillary: 75 mg/dL (ref 70–99)
Glucose-Capillary: 75 mg/dL (ref 70–99)
Glucose-Capillary: 89 mg/dL (ref 70–99)
Glucose-Capillary: 90 mg/dL (ref 70–99)
Glucose-Capillary: 95 mg/dL (ref 70–99)
Glucose-Capillary: 100 mg/dL — ABNORMAL HIGH (ref 70–99)
Glucose-Capillary: 105 mg/dL — ABNORMAL HIGH (ref 70–99)
Glucose-Capillary: 116 mg/dL — ABNORMAL HIGH (ref 70–99)
Glucose-Capillary: 129 mg/dL — ABNORMAL HIGH (ref 70–99)
Glucose-Capillary: 132 mg/dL — ABNORMAL HIGH (ref 70–99)
Glucose-Capillary: 147 mg/dL — ABNORMAL HIGH (ref 70–99)
Glucose-Capillary: 151 mg/dL — ABNORMAL HIGH (ref 70–99)
Glucose-Capillary: 151 mg/dL — ABNORMAL HIGH (ref 70–99)
Glucose-Capillary: 179 mg/dL — ABNORMAL HIGH (ref 70–99)
Glucose-Capillary: 182 mg/dL — ABNORMAL HIGH (ref 70–99)
Glucose-Capillary: 77 mg/dL (ref 70–99)
Glucose-Capillary: 78 mg/dL (ref 70–99)
Glucose-Capillary: 80 mg/dL (ref 70–99)
Glucose-Capillary: 83 mg/dL (ref 70–99)
Glucose-Capillary: 90 mg/dL (ref 70–99)
Glucose-Capillary: 92 mg/dL (ref 70–99)
Glucose-Capillary: 94 mg/dL (ref 70–99)
Glucose-Capillary: 191 mg/dL — ABNORMAL HIGH (ref 70–99)

## 2010-12-14 LAB — GLUCOSE, CAPILLARY
Glucose-Capillary: 104 mg/dL — ABNORMAL HIGH (ref 70–99)
Glucose-Capillary: 125 mg/dL — ABNORMAL HIGH (ref 70–99)
Glucose-Capillary: 136 mg/dL — ABNORMAL HIGH (ref 70–99)
Glucose-Capillary: 201 mg/dL — ABNORMAL HIGH (ref 70–99)
Glucose-Capillary: 208 mg/dL — ABNORMAL HIGH (ref 70–99)
Glucose-Capillary: 223 mg/dL — ABNORMAL HIGH (ref 70–99)
Glucose-Capillary: 69 mg/dL — ABNORMAL LOW (ref 70–99)
Glucose-Capillary: 84 mg/dL (ref 70–99)
Glucose-Capillary: 89 mg/dL (ref 70–99)
Glucose-Capillary: 90 mg/dL (ref 70–99)
Glucose-Capillary: 99 mg/dL (ref 70–99)
Glucose-Capillary: 99 mg/dL (ref 70–99)
Glucose-Capillary: 115 mg/dL — ABNORMAL HIGH (ref 70–99)
Glucose-Capillary: 102 mg/dL — ABNORMAL HIGH (ref 70–99)
Glucose-Capillary: 109 mg/dL — ABNORMAL HIGH (ref 70–99)
Glucose-Capillary: 112 mg/dL — ABNORMAL HIGH (ref 70–99)
Glucose-Capillary: 116 mg/dL — ABNORMAL HIGH (ref 70–99)
Glucose-Capillary: 119 mg/dL — ABNORMAL HIGH (ref 70–99)
Glucose-Capillary: 122 mg/dL — ABNORMAL HIGH (ref 70–99)
Glucose-Capillary: 129 mg/dL — ABNORMAL HIGH (ref 70–99)
Glucose-Capillary: 135 mg/dL — ABNORMAL HIGH (ref 70–99)
Glucose-Capillary: 207 mg/dL — ABNORMAL HIGH (ref 70–99)
Glucose-Capillary: 81 mg/dL (ref 70–99)
Glucose-Capillary: 84 mg/dL (ref 70–99)
Glucose-Capillary: 91 mg/dL (ref 70–99)
Glucose-Capillary: 93 mg/dL (ref 70–99)
Glucose-Capillary: 97 mg/dL (ref 70–99)

## 2010-12-15 LAB — GLUCOSE, CAPILLARY
Glucose-Capillary: 100 mg/dL — ABNORMAL HIGH (ref 70–99)
Glucose-Capillary: 104 mg/dL — ABNORMAL HIGH (ref 70–99)
Glucose-Capillary: 106 mg/dL — ABNORMAL HIGH (ref 70–99)
Glucose-Capillary: 115 mg/dL — ABNORMAL HIGH (ref 70–99)
Glucose-Capillary: 124 mg/dL — ABNORMAL HIGH (ref 70–99)
Glucose-Capillary: 151 mg/dL — ABNORMAL HIGH (ref 70–99)
Glucose-Capillary: 154 mg/dL — ABNORMAL HIGH (ref 70–99)
Glucose-Capillary: 159 mg/dL — ABNORMAL HIGH (ref 70–99)
Glucose-Capillary: 160 mg/dL — ABNORMAL HIGH (ref 70–99)
Glucose-Capillary: 197 mg/dL — ABNORMAL HIGH (ref 70–99)
Glucose-Capillary: 199 mg/dL — ABNORMAL HIGH (ref 70–99)
Glucose-Capillary: 73 mg/dL (ref 70–99)
Glucose-Capillary: 77 mg/dL (ref 70–99)
Glucose-Capillary: 79 mg/dL (ref 70–99)
Glucose-Capillary: 99 mg/dL (ref 70–99)
Glucose-Capillary: 99 mg/dL (ref 70–99)
Glucose-Capillary: 99 mg/dL (ref 70–99)
Glucose-Capillary: 103 mg/dL — ABNORMAL HIGH (ref 70–99)
Glucose-Capillary: 103 mg/dL — ABNORMAL HIGH (ref 70–99)
Glucose-Capillary: 110 mg/dL — ABNORMAL HIGH (ref 70–99)
Glucose-Capillary: 113 mg/dL — ABNORMAL HIGH (ref 70–99)
Glucose-Capillary: 117 mg/dL — ABNORMAL HIGH (ref 70–99)
Glucose-Capillary: 127 mg/dL — ABNORMAL HIGH (ref 70–99)
Glucose-Capillary: 127 mg/dL — ABNORMAL HIGH (ref 70–99)
Glucose-Capillary: 129 mg/dL — ABNORMAL HIGH (ref 70–99)
Glucose-Capillary: 134 mg/dL — ABNORMAL HIGH (ref 70–99)
Glucose-Capillary: 135 mg/dL — ABNORMAL HIGH (ref 70–99)
Glucose-Capillary: 152 mg/dL — ABNORMAL HIGH (ref 70–99)
Glucose-Capillary: 154 mg/dL — ABNORMAL HIGH (ref 70–99)
Glucose-Capillary: 160 mg/dL — ABNORMAL HIGH (ref 70–99)
Glucose-Capillary: 201 mg/dL — ABNORMAL HIGH (ref 70–99)
Glucose-Capillary: 210 mg/dL — ABNORMAL HIGH (ref 70–99)
Glucose-Capillary: 85 mg/dL (ref 70–99)
Glucose-Capillary: 95 mg/dL (ref 70–99)

## 2010-12-16 LAB — GLUCOSE, CAPILLARY
Glucose-Capillary: 82 mg/dL (ref 70–99)
Glucose-Capillary: 115 mg/dL — ABNORMAL HIGH (ref 70–99)
Glucose-Capillary: 121 mg/dL — ABNORMAL HIGH (ref 70–99)
Glucose-Capillary: 136 mg/dL — ABNORMAL HIGH (ref 70–99)
Glucose-Capillary: 141 mg/dL — ABNORMAL HIGH (ref 70–99)
Glucose-Capillary: 148 mg/dL — ABNORMAL HIGH (ref 70–99)
Glucose-Capillary: 151 mg/dL — ABNORMAL HIGH (ref 70–99)
Glucose-Capillary: 151 mg/dL — ABNORMAL HIGH (ref 70–99)
Glucose-Capillary: 153 mg/dL — ABNORMAL HIGH (ref 70–99)
Glucose-Capillary: 156 mg/dL — ABNORMAL HIGH (ref 70–99)
Glucose-Capillary: 169 mg/dL — ABNORMAL HIGH (ref 70–99)
Glucose-Capillary: 172 mg/dL — ABNORMAL HIGH (ref 70–99)
Glucose-Capillary: 189 mg/dL — ABNORMAL HIGH (ref 70–99)
Glucose-Capillary: 190 mg/dL — ABNORMAL HIGH (ref 70–99)
Glucose-Capillary: 191 mg/dL — ABNORMAL HIGH (ref 70–99)
Glucose-Capillary: 251 mg/dL — ABNORMAL HIGH (ref 70–99)
Glucose-Capillary: 86 mg/dL (ref 70–99)
Glucose-Capillary: 99 mg/dL (ref 70–99)
Glucose-Capillary: 178 mg/dL — ABNORMAL HIGH (ref 70–99)
Glucose-Capillary: 102 mg/dL — ABNORMAL HIGH (ref 70–99)
Glucose-Capillary: 105 mg/dL — ABNORMAL HIGH (ref 70–99)
Glucose-Capillary: 105 mg/dL — ABNORMAL HIGH (ref 70–99)
Glucose-Capillary: 109 mg/dL — ABNORMAL HIGH (ref 70–99)
Glucose-Capillary: 114 mg/dL — ABNORMAL HIGH (ref 70–99)
Glucose-Capillary: 119 mg/dL — ABNORMAL HIGH (ref 70–99)
Glucose-Capillary: 121 mg/dL — ABNORMAL HIGH (ref 70–99)
Glucose-Capillary: 131 mg/dL — ABNORMAL HIGH (ref 70–99)
Glucose-Capillary: 133 mg/dL — ABNORMAL HIGH (ref 70–99)
Glucose-Capillary: 172 mg/dL — ABNORMAL HIGH (ref 70–99)
Glucose-Capillary: 188 mg/dL — ABNORMAL HIGH (ref 70–99)
Glucose-Capillary: 223 mg/dL — ABNORMAL HIGH (ref 70–99)
Glucose-Capillary: 299 mg/dL — ABNORMAL HIGH (ref 70–99)

## 2010-12-17 LAB — GLUCOSE, CAPILLARY
Glucose-Capillary: 103 mg/dL — ABNORMAL HIGH (ref 70–99)
Glucose-Capillary: 118 mg/dL — ABNORMAL HIGH (ref 70–99)
Glucose-Capillary: 128 mg/dL — ABNORMAL HIGH (ref 70–99)
Glucose-Capillary: 135 mg/dL — ABNORMAL HIGH (ref 70–99)
Glucose-Capillary: 149 mg/dL — ABNORMAL HIGH (ref 70–99)
Glucose-Capillary: 153 mg/dL — ABNORMAL HIGH (ref 70–99)
Glucose-Capillary: 170 mg/dL — ABNORMAL HIGH (ref 70–99)
Glucose-Capillary: 185 mg/dL — ABNORMAL HIGH (ref 70–99)
Glucose-Capillary: 216 mg/dL — ABNORMAL HIGH (ref 70–99)
Glucose-Capillary: 316 mg/dL — ABNORMAL HIGH (ref 70–99)
Glucose-Capillary: 93 mg/dL (ref 70–99)
Glucose-Capillary: 97 mg/dL (ref 70–99)
Glucose-Capillary: 98 mg/dL (ref 70–99)
Glucose-Capillary: 194 mg/dL — ABNORMAL HIGH (ref 70–99)
Glucose-Capillary: 101 mg/dL — ABNORMAL HIGH (ref 70–99)
Glucose-Capillary: 104 mg/dL — ABNORMAL HIGH (ref 70–99)
Glucose-Capillary: 109 mg/dL — ABNORMAL HIGH (ref 70–99)
Glucose-Capillary: 112 mg/dL — ABNORMAL HIGH (ref 70–99)
Glucose-Capillary: 113 mg/dL — ABNORMAL HIGH (ref 70–99)
Glucose-Capillary: 118 mg/dL — ABNORMAL HIGH (ref 70–99)
Glucose-Capillary: 121 mg/dL — ABNORMAL HIGH (ref 70–99)
Glucose-Capillary: 122 mg/dL — ABNORMAL HIGH (ref 70–99)
Glucose-Capillary: 132 mg/dL — ABNORMAL HIGH (ref 70–99)
Glucose-Capillary: 137 mg/dL — ABNORMAL HIGH (ref 70–99)
Glucose-Capillary: 146 mg/dL — ABNORMAL HIGH (ref 70–99)
Glucose-Capillary: 148 mg/dL — ABNORMAL HIGH (ref 70–99)
Glucose-Capillary: 180 mg/dL — ABNORMAL HIGH (ref 70–99)
Glucose-Capillary: 306 mg/dL — ABNORMAL HIGH (ref 70–99)

## 2010-12-18 LAB — GLUCOSE, CAPILLARY
Glucose-Capillary: 80 mg/dL (ref 70–99)
Glucose-Capillary: 100 mg/dL — ABNORMAL HIGH (ref 70–99)
Glucose-Capillary: 116 mg/dL — ABNORMAL HIGH (ref 70–99)
Glucose-Capillary: 117 mg/dL — ABNORMAL HIGH (ref 70–99)
Glucose-Capillary: 117 mg/dL — ABNORMAL HIGH (ref 70–99)
Glucose-Capillary: 121 mg/dL — ABNORMAL HIGH (ref 70–99)
Glucose-Capillary: 126 mg/dL — ABNORMAL HIGH (ref 70–99)
Glucose-Capillary: 130 mg/dL — ABNORMAL HIGH (ref 70–99)
Glucose-Capillary: 131 mg/dL — ABNORMAL HIGH (ref 70–99)
Glucose-Capillary: 150 mg/dL — ABNORMAL HIGH (ref 70–99)
Glucose-Capillary: 153 mg/dL — ABNORMAL HIGH (ref 70–99)
Glucose-Capillary: 182 mg/dL — ABNORMAL HIGH (ref 70–99)
Glucose-Capillary: 196 mg/dL — ABNORMAL HIGH (ref 70–99)
Glucose-Capillary: 199 mg/dL — ABNORMAL HIGH (ref 70–99)
Glucose-Capillary: 200 mg/dL — ABNORMAL HIGH (ref 70–99)
Glucose-Capillary: 210 mg/dL — ABNORMAL HIGH (ref 70–99)
Glucose-Capillary: 221 mg/dL — ABNORMAL HIGH (ref 70–99)
Glucose-Capillary: 253 mg/dL — ABNORMAL HIGH (ref 70–99)
Glucose-Capillary: 107 mg/dL — ABNORMAL HIGH (ref 70–99)
Glucose-Capillary: 110 mg/dL — ABNORMAL HIGH (ref 70–99)
Glucose-Capillary: 111 mg/dL — ABNORMAL HIGH (ref 70–99)
Glucose-Capillary: 113 mg/dL — ABNORMAL HIGH (ref 70–99)
Glucose-Capillary: 114 mg/dL — ABNORMAL HIGH (ref 70–99)
Glucose-Capillary: 114 mg/dL — ABNORMAL HIGH (ref 70–99)
Glucose-Capillary: 116 mg/dL — ABNORMAL HIGH (ref 70–99)
Glucose-Capillary: 124 mg/dL — ABNORMAL HIGH (ref 70–99)
Glucose-Capillary: 150 mg/dL — ABNORMAL HIGH (ref 70–99)
Glucose-Capillary: 154 mg/dL — ABNORMAL HIGH (ref 70–99)
Glucose-Capillary: 156 mg/dL — ABNORMAL HIGH (ref 70–99)
Glucose-Capillary: 170 mg/dL — ABNORMAL HIGH (ref 70–99)
Glucose-Capillary: 170 mg/dL — ABNORMAL HIGH (ref 70–99)
Glucose-Capillary: 172 mg/dL — ABNORMAL HIGH (ref 70–99)
Glucose-Capillary: 190 mg/dL — ABNORMAL HIGH (ref 70–99)
Glucose-Capillary: 190 mg/dL — ABNORMAL HIGH (ref 70–99)
Glucose-Capillary: 217 mg/dL — ABNORMAL HIGH (ref 70–99)
Glucose-Capillary: 217 mg/dL — ABNORMAL HIGH (ref 70–99)
Glucose-Capillary: 218 mg/dL — ABNORMAL HIGH (ref 70–99)
Glucose-Capillary: 259 mg/dL — ABNORMAL HIGH (ref 70–99)

## 2010-12-19 LAB — GLUCOSE, CAPILLARY
Glucose-Capillary: 103 mg/dL — ABNORMAL HIGH (ref 70–99)
Glucose-Capillary: 110 mg/dL — ABNORMAL HIGH (ref 70–99)
Glucose-Capillary: 115 mg/dL — ABNORMAL HIGH (ref 70–99)
Glucose-Capillary: 116 mg/dL — ABNORMAL HIGH (ref 70–99)
Glucose-Capillary: 122 mg/dL — ABNORMAL HIGH (ref 70–99)
Glucose-Capillary: 127 mg/dL — ABNORMAL HIGH (ref 70–99)
Glucose-Capillary: 129 mg/dL — ABNORMAL HIGH (ref 70–99)
Glucose-Capillary: 130 mg/dL — ABNORMAL HIGH (ref 70–99)
Glucose-Capillary: 149 mg/dL — ABNORMAL HIGH (ref 70–99)
Glucose-Capillary: 163 mg/dL — ABNORMAL HIGH (ref 70–99)
Glucose-Capillary: 174 mg/dL — ABNORMAL HIGH (ref 70–99)
Glucose-Capillary: 182 mg/dL — ABNORMAL HIGH (ref 70–99)
Glucose-Capillary: 245 mg/dL — ABNORMAL HIGH (ref 70–99)
Glucose-Capillary: 107 mg/dL — ABNORMAL HIGH (ref 70–99)
Glucose-Capillary: 109 mg/dL — ABNORMAL HIGH (ref 70–99)
Glucose-Capillary: 123 mg/dL — ABNORMAL HIGH (ref 70–99)
Glucose-Capillary: 125 mg/dL — ABNORMAL HIGH (ref 70–99)
Glucose-Capillary: 143 mg/dL — ABNORMAL HIGH (ref 70–99)
Glucose-Capillary: 151 mg/dL — ABNORMAL HIGH (ref 70–99)
Glucose-Capillary: 181 mg/dL — ABNORMAL HIGH (ref 70–99)
Glucose-Capillary: 240 mg/dL — ABNORMAL HIGH (ref 70–99)
Glucose-Capillary: 256 mg/dL — ABNORMAL HIGH (ref 70–99)
Glucose-Capillary: 98 mg/dL (ref 70–99)
Glucose-Capillary: 99 mg/dL (ref 70–99)
Glucose-Capillary: 174 mg/dL — ABNORMAL HIGH (ref 70–99)

## 2010-12-20 LAB — GLUCOSE, CAPILLARY
Glucose-Capillary: 103 mg/dL — ABNORMAL HIGH (ref 70–99)
Glucose-Capillary: 107 mg/dL — ABNORMAL HIGH (ref 70–99)
Glucose-Capillary: 107 mg/dL — ABNORMAL HIGH (ref 70–99)
Glucose-Capillary: 109 mg/dL — ABNORMAL HIGH (ref 70–99)
Glucose-Capillary: 115 mg/dL — ABNORMAL HIGH (ref 70–99)
Glucose-Capillary: 115 mg/dL — ABNORMAL HIGH (ref 70–99)
Glucose-Capillary: 115 mg/dL — ABNORMAL HIGH (ref 70–99)
Glucose-Capillary: 126 mg/dL — ABNORMAL HIGH (ref 70–99)
Glucose-Capillary: 129 mg/dL — ABNORMAL HIGH (ref 70–99)
Glucose-Capillary: 131 mg/dL — ABNORMAL HIGH (ref 70–99)
Glucose-Capillary: 138 mg/dL — ABNORMAL HIGH (ref 70–99)
Glucose-Capillary: 142 mg/dL — ABNORMAL HIGH (ref 70–99)
Glucose-Capillary: 172 mg/dL — ABNORMAL HIGH (ref 70–99)
Glucose-Capillary: 173 mg/dL — ABNORMAL HIGH (ref 70–99)
Glucose-Capillary: 210 mg/dL — ABNORMAL HIGH (ref 70–99)
Glucose-Capillary: 229 mg/dL — ABNORMAL HIGH (ref 70–99)
Glucose-Capillary: 245 mg/dL — ABNORMAL HIGH (ref 70–99)
Glucose-Capillary: 261 mg/dL — ABNORMAL HIGH (ref 70–99)
Glucose-Capillary: 90 mg/dL (ref 70–99)
Glucose-Capillary: 95 mg/dL (ref 70–99)
Glucose-Capillary: 96 mg/dL (ref 70–99)
Glucose-Capillary: 99 mg/dL (ref 70–99)
Glucose-Capillary: 102 mg/dL — ABNORMAL HIGH (ref 70–99)
Glucose-Capillary: 113 mg/dL — ABNORMAL HIGH (ref 70–99)
Glucose-Capillary: 120 mg/dL — ABNORMAL HIGH (ref 70–99)
Glucose-Capillary: 121 mg/dL — ABNORMAL HIGH (ref 70–99)
Glucose-Capillary: 170 mg/dL — ABNORMAL HIGH (ref 70–99)
Glucose-Capillary: 176 mg/dL — ABNORMAL HIGH (ref 70–99)
Glucose-Capillary: 209 mg/dL — ABNORMAL HIGH (ref 70–99)
Glucose-Capillary: 88 mg/dL (ref 70–99)
Glucose-Capillary: 89 mg/dL (ref 70–99)
Glucose-Capillary: 94 mg/dL (ref 70–99)
Glucose-Capillary: 95 mg/dL (ref 70–99)
Glucose-Capillary: 97 mg/dL (ref 70–99)

## 2010-12-21 LAB — GLUCOSE, CAPILLARY: Glucose-Capillary: 144 mg/dL — ABNORMAL HIGH (ref 70–99)

## 2010-12-23 LAB — GLUCOSE, CAPILLARY: Glucose-Capillary: 126 mg/dL — ABNORMAL HIGH (ref 70–99)

## 2010-12-24 LAB — GLUCOSE, CAPILLARY
Glucose-Capillary: 102 mg/dL — ABNORMAL HIGH (ref 70–99)
Glucose-Capillary: 102 mg/dL — ABNORMAL HIGH (ref 70–99)
Glucose-Capillary: 108 mg/dL — ABNORMAL HIGH (ref 70–99)
Glucose-Capillary: 109 mg/dL — ABNORMAL HIGH (ref 70–99)
Glucose-Capillary: 112 mg/dL — ABNORMAL HIGH (ref 70–99)
Glucose-Capillary: 114 mg/dL — ABNORMAL HIGH (ref 70–99)
Glucose-Capillary: 115 mg/dL — ABNORMAL HIGH (ref 70–99)
Glucose-Capillary: 115 mg/dL — ABNORMAL HIGH (ref 70–99)
Glucose-Capillary: 120 mg/dL — ABNORMAL HIGH (ref 70–99)
Glucose-Capillary: 122 mg/dL — ABNORMAL HIGH (ref 70–99)
Glucose-Capillary: 126 mg/dL — ABNORMAL HIGH (ref 70–99)
Glucose-Capillary: 144 mg/dL — ABNORMAL HIGH (ref 70–99)
Glucose-Capillary: 160 mg/dL — ABNORMAL HIGH (ref 70–99)
Glucose-Capillary: 173 mg/dL — ABNORMAL HIGH (ref 70–99)
Glucose-Capillary: 185 mg/dL — ABNORMAL HIGH (ref 70–99)
Glucose-Capillary: 93 mg/dL (ref 70–99)
Glucose-Capillary: 124 mg/dL — ABNORMAL HIGH (ref 70–99)
Glucose-Capillary: 104 mg/dL — ABNORMAL HIGH (ref 70–99)
Glucose-Capillary: 105 mg/dL — ABNORMAL HIGH (ref 70–99)
Glucose-Capillary: 105 mg/dL — ABNORMAL HIGH (ref 70–99)
Glucose-Capillary: 105 mg/dL — ABNORMAL HIGH (ref 70–99)
Glucose-Capillary: 115 mg/dL — ABNORMAL HIGH (ref 70–99)
Glucose-Capillary: 116 mg/dL — ABNORMAL HIGH (ref 70–99)
Glucose-Capillary: 118 mg/dL — ABNORMAL HIGH (ref 70–99)
Glucose-Capillary: 119 mg/dL — ABNORMAL HIGH (ref 70–99)
Glucose-Capillary: 121 mg/dL — ABNORMAL HIGH (ref 70–99)
Glucose-Capillary: 125 mg/dL — ABNORMAL HIGH (ref 70–99)
Glucose-Capillary: 129 mg/dL — ABNORMAL HIGH (ref 70–99)
Glucose-Capillary: 168 mg/dL — ABNORMAL HIGH (ref 70–99)
Glucose-Capillary: 173 mg/dL — ABNORMAL HIGH (ref 70–99)
Glucose-Capillary: 174 mg/dL — ABNORMAL HIGH (ref 70–99)
Glucose-Capillary: 182 mg/dL — ABNORMAL HIGH (ref 70–99)
Glucose-Capillary: 187 mg/dL — ABNORMAL HIGH (ref 70–99)
Glucose-Capillary: 187 mg/dL — ABNORMAL HIGH (ref 70–99)
Glucose-Capillary: 219 mg/dL — ABNORMAL HIGH (ref 70–99)
Glucose-Capillary: 93 mg/dL (ref 70–99)
Glucose-Capillary: 95 mg/dL (ref 70–99)
Glucose-Capillary: 198 mg/dL — ABNORMAL HIGH (ref 70–99)

## 2010-12-25 LAB — GLUCOSE, CAPILLARY
Glucose-Capillary: 102 mg/dL — ABNORMAL HIGH (ref 70–99)
Glucose-Capillary: 105 mg/dL — ABNORMAL HIGH (ref 70–99)
Glucose-Capillary: 108 mg/dL — ABNORMAL HIGH (ref 70–99)
Glucose-Capillary: 111 mg/dL — ABNORMAL HIGH (ref 70–99)
Glucose-Capillary: 131 mg/dL — ABNORMAL HIGH (ref 70–99)
Glucose-Capillary: 134 mg/dL — ABNORMAL HIGH (ref 70–99)
Glucose-Capillary: 140 mg/dL — ABNORMAL HIGH (ref 70–99)
Glucose-Capillary: 142 mg/dL — ABNORMAL HIGH (ref 70–99)
Glucose-Capillary: 150 mg/dL — ABNORMAL HIGH (ref 70–99)
Glucose-Capillary: 212 mg/dL — ABNORMAL HIGH (ref 70–99)
Glucose-Capillary: 89 mg/dL (ref 70–99)
Glucose-Capillary: 91 mg/dL (ref 70–99)
Glucose-Capillary: 93 mg/dL (ref 70–99)
Glucose-Capillary: 94 mg/dL (ref 70–99)
Glucose-Capillary: 101 mg/dL — ABNORMAL HIGH (ref 70–99)
Glucose-Capillary: 106 mg/dL — ABNORMAL HIGH (ref 70–99)
Glucose-Capillary: 106 mg/dL — ABNORMAL HIGH (ref 70–99)
Glucose-Capillary: 122 mg/dL — ABNORMAL HIGH (ref 70–99)
Glucose-Capillary: 127 mg/dL — ABNORMAL HIGH (ref 70–99)
Glucose-Capillary: 129 mg/dL — ABNORMAL HIGH (ref 70–99)
Glucose-Capillary: 132 mg/dL — ABNORMAL HIGH (ref 70–99)
Glucose-Capillary: 133 mg/dL — ABNORMAL HIGH (ref 70–99)
Glucose-Capillary: 145 mg/dL — ABNORMAL HIGH (ref 70–99)
Glucose-Capillary: 172 mg/dL — ABNORMAL HIGH (ref 70–99)
Glucose-Capillary: 183 mg/dL — ABNORMAL HIGH (ref 70–99)
Glucose-Capillary: 186 mg/dL — ABNORMAL HIGH (ref 70–99)
Glucose-Capillary: 261 mg/dL — ABNORMAL HIGH (ref 70–99)
Glucose-Capillary: 88 mg/dL (ref 70–99)

## 2010-12-26 LAB — GLUCOSE, CAPILLARY
Glucose-Capillary: 106 mg/dL — ABNORMAL HIGH (ref 70–99)
Glucose-Capillary: 162 mg/dL — ABNORMAL HIGH (ref 70–99)

## 2010-12-27 LAB — GLUCOSE, CAPILLARY: Glucose-Capillary: 100 mg/dL — ABNORMAL HIGH (ref 70–99)

## 2010-12-28 LAB — GLUCOSE, CAPILLARY: Glucose-Capillary: 124 mg/dL — ABNORMAL HIGH (ref 70–99)

## 2010-12-29 LAB — GLUCOSE, CAPILLARY
Glucose-Capillary: 102 mg/dL — ABNORMAL HIGH (ref 70–99)
Glucose-Capillary: 126 mg/dL — ABNORMAL HIGH (ref 70–99)

## 2010-12-30 LAB — GLUCOSE, CAPILLARY
Glucose-Capillary: 88 mg/dL (ref 70–99)
Glucose-Capillary: 123 mg/dL — ABNORMAL HIGH (ref 70–99)

## 2011-01-01 LAB — GLUCOSE, CAPILLARY: Glucose-Capillary: 127 mg/dL — ABNORMAL HIGH (ref 70–99)

## 2011-01-03 LAB — GLUCOSE, CAPILLARY
Glucose-Capillary: 134 mg/dL — ABNORMAL HIGH (ref 70–99)
Glucose-Capillary: 179 mg/dL — ABNORMAL HIGH (ref 70–99)

## 2011-01-04 LAB — GLUCOSE, CAPILLARY
Glucose-Capillary: 129 mg/dL — ABNORMAL HIGH (ref 70–99)
Glucose-Capillary: 144 mg/dL — ABNORMAL HIGH (ref 70–99)

## 2011-01-05 LAB — GLUCOSE, CAPILLARY: Glucose-Capillary: 107 mg/dL — ABNORMAL HIGH (ref 70–99)

## 2011-01-07 LAB — GLUCOSE, CAPILLARY
Glucose-Capillary: 106 mg/dL — ABNORMAL HIGH (ref 70–99)
Glucose-Capillary: 226 mg/dL — ABNORMAL HIGH (ref 70–99)

## 2011-01-08 LAB — GLUCOSE, CAPILLARY
Glucose-Capillary: 98 mg/dL (ref 70–99)
Glucose-Capillary: 167 mg/dL — ABNORMAL HIGH (ref 70–99)

## 2011-01-09 LAB — GLUCOSE, CAPILLARY
Glucose-Capillary: 93 mg/dL (ref 70–99)
Glucose-Capillary: 157 mg/dL — ABNORMAL HIGH (ref 70–99)

## 2011-01-11 LAB — GLUCOSE, CAPILLARY
Glucose-Capillary: 89 mg/dL (ref 70–99)
Glucose-Capillary: 105 mg/dL — ABNORMAL HIGH (ref 70–99)

## 2011-01-12 LAB — GLUCOSE, CAPILLARY
Glucose-Capillary: 87 mg/dL (ref 70–99)
Glucose-Capillary: 106 mg/dL — ABNORMAL HIGH (ref 70–99)

## 2011-01-14 LAB — GLUCOSE, CAPILLARY: Glucose-Capillary: 129 mg/dL — ABNORMAL HIGH (ref 70–99)

## 2011-01-16 LAB — GLUCOSE, CAPILLARY
Glucose-Capillary: 105 mg/dL — ABNORMAL HIGH (ref 70–99)
Glucose-Capillary: 151 mg/dL — ABNORMAL HIGH (ref 70–99)

## 2011-01-17 LAB — GLUCOSE, CAPILLARY: Glucose-Capillary: 113 mg/dL — ABNORMAL HIGH (ref 70–99)

## 2011-01-19 LAB — GLUCOSE, CAPILLARY: Glucose-Capillary: 82 mg/dL (ref 70–99)

## 2011-01-20 LAB — GLUCOSE, CAPILLARY
Glucose-Capillary: 77 mg/dL (ref 70–99)
Glucose-Capillary: 178 mg/dL — ABNORMAL HIGH (ref 70–99)

## 2011-01-21 LAB — GLUCOSE, CAPILLARY: Glucose-Capillary: 95 mg/dL (ref 70–99)

## 2011-01-22 LAB — GLUCOSE, CAPILLARY: Glucose-Capillary: 83 mg/dL (ref 70–99)

## 2011-01-22 NOTE — Op Note (Signed)
NAMEVERLAINE, Nancy [name]             ACCOUNT NO.:  [id number]   MEDICAL RECORD NO.:  [id number]          PATIENT TYPE:  AMB   LOCATION:  DAY                           FACILITY:  [facility]   PHYSICIAN:  Kassie Mends, M.D.      DATE OF BIRTH:  1925/04/01   DATE OF PROCEDURE:  12/27/2008  DATE OF DISCHARGE:                               OPERATIVE REPORT   REFERRING PHYSICIAN:  Lenon Curt. Green, MD   PROCEDURE:  Colonoscopy with cold forceps and snare cautery polypectomy.   INDICATION FOR EXAMINATION:  Nancy [name] is an 77 year old female who had  colon cancer diagnosed in 1989 and had a subsequent left hemicolectomy.  Her last colonoscopy was in 2005 and she had polyps.  She presents for  continued surveillance.   FINDINGS:  1. Two 6-mm hepatic flexure polyps removed via snare cautery.  One 4      mm sessile hepatic flexure polyp removed via cold forceps.  One      sessile 6-mm transverse colon polyp removed via snare cautery.  One      sessile 3-mm transverse colon polyp removed via cold forceps.  2. Rare diverticula seen.  3. The anastomosis is approximately 10 cm above the anal verge.      Retroflexed view was performed with the 3.2 gastroscope.  Unable to      appreciate any polyps or hemorrhoids on retroflexed view.   DIAGNOSES:  1. Multiple colon polyps.  2. Rare mild diverticulosis.   RECOMMENDATIONS:  1. No aspirin or anti-inflammatory drugs for 7 days.  2. May resume all other previous medications.  3. Screening colonoscopy in 5 years if benefits outweigh the risks.   PROCEDURE TECHNIQUE:  Physical exam was performed.  Informed consent was  obtained from the patient after explaining the benefits, risks, and  alternatives to the procedure.  The patient was connected to the monitor  and placed in the left lateral position.  Continuous oxygen was provided  by nasal cannula and IV medicine was administered through an indwelling  cannula.  After administration of sedation and rectal  exam, the  patient's rectum was intubated and the scope was advanced under direct  visualization to the cecum.  The scope was removed slowly by carefully  examining the color, texture, anatomy, and integrity of the mucosa on  the way out. Retroflexion  performed with a diagnostic gastroscope. The patient was recovered in  endoscopy and discharged to the nursing facility in satisfactory  condition.   PATH:  SIMPLE ADENOMA      Kassie Mends, M.D.  Electronically Signed     SM/MEDQ  D:  12/27/2008  T:  12/27/2008  Job:  [id number]   cc:   Lenon Curt. Chilton Si, M.D.  Fax: [phone]

## 2011-01-22 NOTE — H&P (Signed)
Nancy [name], NANNINGA             ACCOUNT NO.:  [id number]   MEDICAL RECORD NO.:  [id number]          PATIENT TYPE:  INP   LOCATION:  IC09                          FACILITY:  [facility]   PHYSICIAN:  Marcello Moores, MD   DATE OF BIRTH:  08/11/1925   DATE OF ADMISSION:  03/26/2007  DATE OF DISCHARGE:  LH                              HISTORY & PHYSICAL   PMD:  She is from [facility] nursing home.   CHIEF COMPLAINT:  Unresponsiveness with hypotension and bradycardia.   HISTORY OF PRESENT ILLNESS:  Ms. Nancy [name] is a 77 year old female patient,  nursing home resident in [place], and she was brought by EMS after  they were informed by the facility for her responsiveness and  bradycardia and hypotension.  As per the daughter and ER physician, the  patient was unresponsive and EMS was recorded with bradycardia in 30s  and low blood pressure, and they gave her atropine and her pulse rate  improved to 60s.  The patient explained that when she was trying to go  to the bathroom she felt dizzy and she fell down, and her daughter was  informed by the facility nurse that her mother was trying to go to the  bathroom and she fell down and became unresponsive and she was found to  be hypotensive as well as bradycardic.  The patient now states that she  is feeling fine and she stated that she did not feel the same way  before, but her record was showing that she was also admitted before  with dizziness and altered mental status in 2007.  Otherwise, she is  febrile and she denied any chest pain and she has not any pulmonary as  well as any GI complaints or any urinary complaint currently.   REVIEW OF SYSTEMS:  Currently noncontributory.   ALLERGIES:  No known drug allergies.   SOCIAL HISTORY:  She is a nursing home resident and her daughter is  living nearby, and she is in her bedside currently.   On physical examination, the patient is in no respiratory distress  currently.  She is speaking slowly  and responding my answers very  slowly.  VITAL SIGNS:  Her heart rate currently is between 50 and 60 and the last  in the monitor is 48, but she is as symptomatic.  Temperature is 97.7  and blood pressure is 125/51, respiratory rate is 19.  HEENT:  She has pink conjunctivae and scarred face.  Neck is supple.  CHEST:  Good air entry bilateral.  CVS:  S1-S2 well-heard, bradycardiac.  Abdomen is soft.  No area of tenderness.  EXTREMITIES:  No edema.  She has contracture on the right hand.  CNS:  She is alert and fairly oriented.  She explained what happened to  her at least.  No neurological deficit.   On the labs, chemistry where sodium is 139, potassium is 4.5, chloride  107, bicarb 30 and glucose is 106 and BUN 18, creatinine is 1.21,  calcium is 9.3.  on the hematology, white blood cell 4.24, hemoglobin is  10.9, hematocrit is 33 and platelet  count is 219.  Troponin is less than  0.01 and CK-MB is 1.3.  Chest x-ray was done and showed only  cardiomegaly.  EKG was done in the emergency room with normal sinus  rhythm at 68 per minute.  There is not any sign of block.   PAST MEDICAL HISTORY:  1. Diabetes mellitus type 2, well-controlled.  2. Advanced dementia, stable.  3. Hypertension.  4. Coronary artery disease, stable.  5. Dyslipidemia.  6. History of depression.  7. History of altered mental status and dizziness in 2007, which was      investigated with MRI of brain.   HOME MEDICATIONS:  1. Aspirin 81 mg p.o. daily.  2. Potassium chloride 10 mEq daily.  3. Lasix 20 mg once a day.  4. Lopressor 25 mg twice a day.  5. Namenda 10 mg twice a day.  6. Aricept 10 mg at bedtime.  7. Lexapro 20 mg once a day.  8. Novolin sliding scale.  9. Vasotec 5 mg once a day.  10.Norvasc 5 mg a day.  11.Glucophage 500 mg twice a day.  12.Synthroid 75 mcg daily.   1. Syncope with unresponsiveness.  This is most likely secondary to      her episode of hypotension and bradycardia and  atropine was given,      which was responded at that time, and will admit her to ICU and      will monitor her continuously and if her bradycardia persisted      below 40 or if she became symptomatic, we will give her atropine,      and if it is persistent we will consider also temporary pacing.  If      her bradycardia or symptom persisted, will call cardiologist today.      If improved, will consult cardiology tomorrow morning.  2. Episode of hypotension and bradycardia.  This could be vasovagal      but still it could be also to her medications.  She was on      __________ and for now I will hold all her hypertension      medications.  3. Diabetes mellitus, well-controlled, and will put her on sliding      scale and we will put her on coverage with it.  4. Hypothyroidism.  She is on Synthroid, so we will continue with      Synthroid at the home dose and will send thyroid-stimulating      hormone and T4 and will adjust her medication depending and will      see if hypothyroidism is contributing to her current episode of      bradycardia as well.  5. Hypertension.  For now her blood pressure is okay and will hold      hypertension medications as mentioned above.  6. Other medical problems fairly stable, her dementia and coronary      artery disease, she has not any issues related to that and will      continue with the above management, and if the patient remains      stable will do a CT scan of the      brain to see if there is any infarct or bleeding which might      contribute for her bradycardia and unresponsiveness.  Otherwise, we      will put her on GI prophylaxis and will put her on Lovenox also      after doing the CT scan.  Marcello Moores, MD  Electronically Signed     MT/MEDQ  D:  03/26/2007  T:  03/27/2007  Job:  [id number]
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
## 2011-01-22 NOTE — Procedures (Signed)
Nancy [name], Nancy [name]             ACCOUNT NO.:  [id number]   MEDICAL RECORD NO.:  [id number]          PATIENT TYPE:  INP   LOCATION:  A204                          FACILITY:  [facility]   PHYSICIAN:  Gerrit Friends. Dietrich Pates, MD, FACCDATE OF BIRTH:  [birth year]   DATE OF PROCEDURE:  03/27/2007  DATE OF DISCHARGE:                                ECHOCARDIOGRAM   PROCEDURE:  ECHOCARDIOGRAM.   CLINICAL DATA:  A 77 year old woman with syncope, hypertension, and a  history of coronary disease.   M-MODE:  The aorta is 3.0, the left atrium 4.4, septum 1.5, posterior  wall 1.3, LV diastole 4.3, LV systole 2.7.   1. Technically adequate echocardiographic study.  2. Mild to moderate left atrial enlargement; normal right atrium and      right ventricle.  3. Mild sclerosis of a trileaflet aortic valve.  4. Normal diameter of the proximal ascending aorta; mild calcification      of the wall and annulus.  5. Normal mitral valve; mild regurgitation.  6. Normal tricuspid valve; mild regurgitation; mild elevation in      estimated RV systolic pressure.  7. Normal pulmonic valve and proximal pulmonary artery.  8. Normal left ventricular size; mild to moderate hypertrophy; normal      regional and global function.  9. IVC not well imaged; grossly normal.      Gerrit Friends. Dietrich Pates, MD, [facility]  Electronically Signed     RMR/MEDQ  D:  03/27/2007  T:  03/28/2007  Job:  [id number]

## 2011-01-22 NOTE — Discharge Summary (Signed)
NAMETRANA, RESSLER             ACCOUNT NO.:  [id number]   MEDICAL RECORD NO.:  [id number]          PATIENT TYPE:  INP   LOCATION:  A204                          FACILITY:  [facility]   PHYSICIAN:  Skeet Latch, DO    DATE OF BIRTH:  July 05, 1925   DATE OF ADMISSION:  03/26/2007  DATE OF DISCHARGE:  07/22/2008LH                               DISCHARGE SUMMARY   DISCHARGE DIAGNOSES:  1. Syncope with bradycardia.  2. Hypothyroidism.  3. Hypertension.  4. Type 2 diabetes.  5. Dementia.  6. Coronary artery disease.  7. Lung nodule.   HISTORY OF PRESENT ILLNESS:  Nancy [name] is a 77 year old female, a  resident of [facility], brought by EMS for being unresponsive, having  bradycardia and hypotension.  According to the daughter, the patient was  found unresponsive at the nursing home and was found with a heart rate  in the 30's and hypotensive.  EMS gave the patient atropine and her  pulse rate improved to the 60's.  It was explained that when the patient  was going to the bathroom, she felt dizzy and fell down.  Her mother was  in the bathroom, unresponsive and hypotensive and bradycardic.  Upon  arriving at the emergency room the patient stated that she was feeling  fine.  She denied any chest pain or any other complaints at that time.  The patient was subsequently admitted to the hospital.   LABORATORY DATA:  Initial chemistry showed a sodium of 139, potassium  4.5, chloride 107, bicarbonate 32, glucose 106, BUN 18, creatinine 1.21.  White count 4.24, hemoglobin 10.9, hematocrit 33, platelet count of 219.  Negative troponin.  CK-MB was 1.3.   He had a normal mitral valve, normal tricuspid valve, with some mild  regurgitation, mild elevation in her RV systolic pressure.  Overall she  had a normal echocardiogram.   He had a chest x-ray which showed cardiomegaly.   The patient also had a CT of her head without contrast which showed  atrophy and chronic ischemic changes.  No  identified acute insult.  Some  fluid in her right mastoid region.  The patient had a CT angiogram of  her chest which showed a pulmonary nodule in her left upper lobe, and  they recommended that the patient have a followup in three to six  months, probable mucus obstructing the right middle lobe bronchus also.   Subsequent chest x-ray on March 30, 2007, showed cardiomegaly with slight  pulmonary vascular congestion, left upper lobe nodule that was seen on  CT scan.   The patient was admitted to the intensive care unit for closer  monitoring.  The cardiologist was consulted.  The patient was placed on  a sliding scale for her diabetes.  She had laboratory work obtained.  She was IV-hydrated.  Subsequently an electrocardiogram was performed  which showed mild to moderate left atrial enlargement and normal right  atrium and right ventricle and right sclerosis.   Cardiology, as stated, has been following the patient.  The patient's  bradycardia has resolved.  A statin was added for her coronary artery  disease.  The patient was also placed on a diuretic, on her other blood  pressure medications when her  beta blocker was discontinued.   The patient did have some hypokalemia, which also resolved with  replacement.  The patient was found to have a low TSH, probably  euthyroid with decrease in pituitary section.  The patient probably  needs her TSH checked in the next three to four weeks.   Electrocardiogram showed a normal sinus rhythm with a rate of 68.   The patient is stable at this time and is ready for discharge.   DISCHARGE MEDICATIONS:  1. Aspirin 81 mg daily.  2. Potassium chloride 10 mEq, two tab daily.  3. Poly-iron 150 mg b.i.d.  4. Namenda 10 mg b.i.d.  5. Aricept 10 mg at bedtime.  6. Lexapro 20 mg at bedtime.  7. Novolin insulin 12 units subcu q. Morning.  8. Glucophage 500 mg b.i.d. with food.  9. Humulin sliding scale.  10.Hydrochlorothiazide 12.5 mg p.o. daily.   11.Zocor 40 mg p.o. daily.  12.Tylenol 325 mg, two tab q.4h. p.r.n.  13.Combivent inhaler two puffs q.12h. p.r.n.  14.Abilify 2 mg q. Bedtime.   CONSULTATIONS:  Dr. Gerrit Friends. [facility].   CONDITION ON DISCHARGE:  The patient is discharged in stable condition  to [facility].   DIET:  The patient is to maintain a low-sodium, heart-healthy diet.   ACTIVITY:  To increase activity slowly as previously directed.   INSTRUCTIONS:  1. The patient is to follow up with her primary care physician in the      next one to two weeks.  2. Of note, the patient will need to have a repeat CT of her chest      regarding lung nodules seen on CT during this hospital stay.     Skeet Latch, DO  Electronically Signed    SM/MEDQ  D:  03/31/2007  T:  03/31/2007  Job:  [id number]

## 2011-01-23 LAB — GLUCOSE, CAPILLARY
Glucose-Capillary: 82 mg/dL (ref 70–99)
Glucose-Capillary: 171 mg/dL — ABNORMAL HIGH (ref 70–99)

## 2011-01-24 LAB — GLUCOSE, CAPILLARY: Glucose-Capillary: 104 mg/dL — ABNORMAL HIGH (ref 70–99)

## 2011-01-25 LAB — GLUCOSE, CAPILLARY: Glucose-Capillary: 97 mg/dL (ref 70–99)

## 2011-01-25 NOTE — Op Note (Signed)
[place]. [facility]  Patient:    Nancy [name], Nancy [name] Visit Number: [id number] MRN: [id number]          Service Type: Attending:  Gwenith Daily. Tyrone Sage, M.D. Dictated by:   Gwenith Daily Tyrone Sage, M.D. Proc. Date: 05/18/01   CC:         Daisey Must, M.D. [facility]   Operative Report  PREOPERATIVE DIAGNOSIS:  Coronary occlusive disease.  POSTOPERATIVE DIAGNOSIS:  Coronary occlusive disease.  OPERATION PERFORMED:  Coronary artery bypass grafting x 5 with a left internal mammary artery to the left anterior descending coronary artery, reversed saphenous vein to the diagonal coronary artery, sequential reversed saphenous vein to the first obtuse marginal and distal circumflex, reversed saphenous vein to the right coronary artery.  SURGEON:  Gwenith Daily. Tyrone Sage, M.D.  FIRST ASSISTANT:  Carlye Grippe.  ANESTHESIA:  General.  INDICATIONS FOR PROCEDURE:  The patient is a 77 year old female who presented with increasing symptoms of dyspnea on exertion and chest discomfort with activity.  Because of the increasing symptoms, cardiac evaluation was recommended.  The patient underwent coronary angiography by Dr. Gerri Spore which demonstrated significant three-vessel coronary artery disease including subtotal occlusion of the left anterior descending and the right coronary artery and an 80% stenosis of the right coronary artery which had collaterals filling the LAD.  She had a small circumflex system with a subtotal occlusion of the circumflex.  Because of the patients increasing symptoms and three-vessel coronary artery disease, coronary artery bypass grafting was recommended to the patient who agreed and signed informed consent.  DESCRIPTION OF PROCEDURE:  With Swan-Ganz and arterial line monitors in place, the patient underwent general endotracheal anesthesia without incident.  The skin of the chest and legs was prepped with Betadine and draped in the  usual sterile manner.  Using the Guidant endo vein harvesting system, vein was initially harvested first from the right thigh.  However, the vein was partially unsatisfactory.  One segment of vein was usable.  Additional vein was harvested first from the right lower extremity.  This two was also poor quality vein.  Additional vein was harvested from the left lower extremity. With this, satisfactory segments of vein were harvested.  A median sternotomy was performed and the left internal mammary artery was dissected down as a pedicle graft.  The distal artery was divided and had good free flow.  The pericardium was opened.  Overall ventricular function appeared preserved.  The patient was systemically heparinized.  The ascending aorta was cannulated in the aortic root.  A bent cardioplegia needle was introduced into the ascending aorta.  The right atrium was also cannulated.  The patient was placed on cardiopulmonary bypass at 2.4 L per minute per meter squared.  Sites for anastomosis were selected and dissected out of the epicardium.  The patients body temperature was then cooled to 30 degrees.  The aortic crossclamp was applied.  500 cc of cold blood potassium cardioplegia was administered with rapid diastolic arrest of the heart.  Attention was turned first to the distal right coronary artery which was opened and admitted a 1.5 mm probe.  Using a running 7-0 Prolene and distal anastomosis was performed.  Attention was then turned to a first obtuse marginal vessel which was partially intramyocardial.  The vessel was opened and admitted a 1.5 mm probe.  Using a diamond type side-to-side was carried with a running 7-0 Prolene.  Distal extent of the same vein was then carried on to the distal circumflex branch  which was opened and was smaller 1.2 to 1.3 mm in size. Using a running 7-0 Prolene, distal anastomosis was performed.  Attention was then turned to the diagonal coronary artery which  was a small vessel but long and supplied a significant portion of the lateral wall.  The vessel was opened and admitted a 1 mm probe.  Using running 7-0 Prolene, a distal anastomosis was performed with a segment of reversed saphenous vein graft.  Attention was then turned to the left anterior descending coronary artery which was opened and admitted a 1.5 mm probe.  Using a running 8-0 Prolene, the left internal mammary artery was anastomosed to the left anterior descending coronary artery. With release of Edwards bulldog on the mammary artery, there was appropriate rise in the myocardial septal temperature.  The aortic crossclamp was removed.  Total crossclamp time was 50 minutes.  The patient required electrical defibrillation to return to a sinus rhythm.  A partial occlusion clamp was placed on the ascending aorta.  Three punch aortotomies were performed.  Each of the three vein grafts was anastomosed to the ascending aorta.  Air was evacuated from the grafts and partial occlusion clamp was removed.  Sites of anastomosis were inspected and were free of bleeding.  The patient was then ventilated and weaned from cardiopulmonary bypass without difficulty.  She remained hemodynamically stable and was decannulated in the usual fashion.  Protamine sulfate was administered.  With the operative field hemostatic, two atrial and two ventricular pacing wires were applied.  Graft markers were applied.  A left pleural tube and two mediastinal tubes were left in place.  The sternum was closed with #6 stainless steel wire, the fascia was closed with interrupted 0 Vicryl and running 3-0 Vicryl in the subcutaneous tissue and 4-0 subcuticular stitch in the skin edges.  Dry dressings were applied.  The sponge and needle counts were reported as correct at completion of the procedure.  The patient tolerated the procedure without obvious complication and was transferred to the surgical intensive care unit  for further postoperative care. Dictated by:   Gwenith Daily Tyrone Sage, M.D.  Attending:  Gwenith Daily Tyrone Sage, M.D. DD:  05/18/01 TD:  05/18/01 Job: [id number]

## 2011-01-25 NOTE — Op Note (Signed)
[place]. [facility]  Patient:    Nancy [name], Nancy [name]              MRN: [id number] Proc. Date: 01/05/01 Adm. Date:  [id number] Disc. Date: [id number] Attending:  Ernesto Rutherford                           Operative Report  PREOPERATIVE DIAGNOSIS: 1. 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 2. Progressive proliferative diabetic retinopathy, left eye.  POSTOPERATIVE DIAGNOSIS: 1. 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 2. Progressive proliferative diabetic retinopathy, left eye.  OPERATION PERFORMED: 1. Posterior vitrectomy and membrane peel, left eye. 2. Endolaser panphotocoagulation, left eye.  SURGEON:  Ernesto Rutherford, M.D.  ANESTHESIA:  Local retrobulbar with monitored anesthesia control.  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  This is an attempt to release that traction so as to allow for visual acuity stabilization if not improvement.  The patient understands the risks of anesthesia including the rare occurrence of death but also to the eye including hemorrhage, infection, scarring, need for another surgery, no change in vision, loss of vision, and progressive disease despite intervention.  DESCRIPTION OF PROCEDURE:  After appropriate signed consent was obtained, the patient was taken to the operating room where appropriate monitoring was followed by mild sedation.  0.75% Marcaine was then delivered 5 cc retrobulbar followed by an additional 5 cc laterally in the fashion of modified Darel Hong. The left periocular region was sterilely prepped and draped in the  usual ophthalmic fashion.  A lid speculum was applied.  Conjunctival peritomy fashioned temporally and supranasally.  A 4 mm infusion was secured 3.5 mm posterior to the limbus in the inferotemporal quadrant.  Placement in vitreous cavity verified visually.  Superior sclerotomy was then fashioned.  Wild microscope placed in position with Biom attached.  Core vitrectomy was then begun.  Iatrogenic posterior vitreous detachment was necessary and this was removed in a modified en bloc technique ____________ macular region. ____________ region was identified.  Nonetheless there was significant internal limiting membrane stria remaining.  For this reason, fluid-air exchange was then completed.  Under air very dilute ICG dye was injected over the posterior pole and immediately aspirated to stain the internal limiting membrane.  Thereafter an air-fluid exchange was completed again and under fluid, internal limiting membrane was peeled off the macular region.  The vitreous skirt was completely elevated anterior to the equator 360 degrees and trimmed.  Endolaser photocoagulation in a peripheral pattern was applied.  At this time instruments were removed from the eye. The decision was made not to place gas injection because there were no retinal holes identified. Hemostasis was spontaneous.  Superior sclerotomy was then closed with 7-0 Vicryl suture.  The infusion was removed and similarly closed with 7-0 Vicryl suture.  At this time conjunctiva was closed.  Intraoccular pressure was assessed and found to be adequate.  A sterile patch and Fox shield were applied to the left eye.  The patient tolerated the procedure well without complication and was taken to the recovery room in good and stable condition. DD:  01/05/01 TD:  01/06/01 Job:  [id number]

## 2011-01-25 NOTE — Op Note (Signed)
[facility]  Patient:    LAVONE, BARRIENTES Visit Number: [id number] MRN: [id number]          Service Type: EMS Location: ED Attending Physician:  Herbert Seta Dictated by:   Roetta Sessions, M.D. Proc. Date: 09/25/01 Admit Date:  09/25/2001 Discharge Date: 09/25/2001   CC:         Renne Musca, M.D.   Operative Report  PROCEDURE:  Emergency room consultation and percutaneous endoscopic gastrostomy (PEG) change.  GASTROENTEROLOGIST:  Roetta Sessions, M.D.  REASON FOR CONSULTATION:  I was called by ED staff to see this lady who pulled out her PEG tube.  HISTORY:  Ms. Daphnee Preiss is a 77 year old lady with dementia.  She is a resident of [facility] in [place] who just had a PEG placed on September 17, 2001, down in [place] for dysphagia secondary to CVA.  Apparently the staff was using it without difficulty until sometime this morning when the patient went to the bathroom and pulled it out.  The staff attempted to place a Foley but could not get it to go in.  It was reported that they placed a 24-French PEG tube originally.  This lady has been admitted through the emergency room at [facility].  She is stable.  PAST MEDICAL HISTORY:  Significant for coronary artery disease, non-insulin-dependent diabetes mellitus, hypertension, chronic renal insufficiency, hyperlipidemia, and dementia.  CURRENT MEDICATIONS: 1. Prevacid 30 mg daily. 2. Lopressor 25 mg b.i.d. 3. Kay-Ciel 20 mEq daily. 4. Lasix 80 mg b.i.d. 5. Apresoline 10 mg, 20 mg q.6h. 6. Demerol 2.t mg q.6h. 7. Lantus 25 units at bed time.  ALLERGIES:  No known drug allergies.  FAMILY HISTORY:  Noncontributory.  SOCIAL HISTORY:  Noncontributory.  PHYSICAL EXAMINATION:  GENERAL:  Pleasant, elderly, African-American female resting comfortably.  VITAL SIGNS:  Temperature 98.3, pulse 86, respiratory rate 18, blood pressure 135/96.  SKIN:  She has some villous skin  changes on her face.  HEENT:  No scleral icterus.  CHEST:  Lungs are clear to auscultation.  CARDIAC:  Regular rate and rhythm without murmur, gallop, or rub.  ABDOMEN:  Nondistended.  She has a laparotomy scar that is well healed.  She has the ostomy site which looks partially closed.  It certainly looks smaller than a 24-French size.  EXTREMITIES:  Trace lower extremity edema.  ASSESSMENT:  I discussed trying to put a percutaneous endoscopic gastrostomy (PEG) tube in a freshly created ostomy.  It is possible that the site has been lost given that the patient recently pulled out the PEG tube.  I discussed the situation with her brother.  Will attempt to put a new 20-French with a balloon type bumper in.  Will avoid the mushroom type bumper as this might shear the stomach away from the abdominal wall during placement.  TECHNIQUE:  Using a 20-French balloon type replacement PEG tube, this was inserted through the ostomy.  It was somewhat tight, but it did eventually go through fairly easily.  I inflated the balloon fully to 20 cc with water.  I snugged the external bumper at 5 cm.  There was good auscultation of insufflated air.  To be sure that this was in adequate position, a Gastrografin tube study was obtained which confirmed that the internal end of the tube was in the stomach.  The patient tolerated the procedure well.  IMPRESSION:  Status post placement of a new 20-French barred balloon type PEG tube.  Position confirmed by Gastrografin tube study.  The external aspect of this tube is somewhat stiff.  I would consider this tube somewhat temporary until the ostomy has a chance to mature up; i.e., in three to four weeks. Would then bring her back and put in a mushroom type bumper that is a bit more flexible and will be a little bit more tolerable.  However, in the interim, place abdominal binder and keep the PEG covered away from the patient to protect it inadvertent  manipulating. Dictated by:   Roetta Sessions, M.D. Attending Physician:  Herbert Seta DD:  09/25/01 TD:  09/28/01 Job: [id number]

## 2011-01-25 NOTE — Discharge Summary (Signed)
NAMESETSUKO, ROBINS             ACCOUNT NO.:  [id number]   MEDICAL RECORD NO.:  [id number]          PATIENT TYPE:  INP   LOCATION:  A220                          FACILITY:  [facility]   PHYSICIAN:  Osvaldo Shipper, MD     DATE OF BIRTH:  1925-01-26   DATE OF ADMISSION:  08/05/2006  DATE OF DISCHARGE:  12/03/2007LH                               DISCHARGE SUMMARY   Please see H&P dictated by Dr. Corky Downs for details regarding the  patient's presenting illness.   DISCHARGE DIAGNOSES:  1. Rhabdomyolysis likely from fall.  2. History of dizziness, unsteady gait of unclear etiology, likely      secondary to physical deconditioning.  3. Altered mental status now back to baseline.  4. History of advanced dementia - stable.  5. History of coronary artery disease - stable.  6. History of hypertension requiring further adjustments to      medication.  7. Type 2 diabetes - stable.  8. Dyslipidemia - stable.  9. History of depression - stable.   BRIEF HOSPITAL COURSE:  Briefly, this is a 77 year old African-American  female who lives at [facility] who was brought in  because of persistent dizziness and a fall.  The patient was admitted to  the hospital.  Initial blood work showed some mild anemia with slightly  elevated blood sugar and a CK which was 548 with an MB of 8.4 with  normal troponins.  TSH and free T4 were normal.  UA suggested maybe mild  UTI.  No clear etiology was found for her symptoms and she underwent CAT  scan of her head which showed atrophy, possibly old infarcts with no  other acute abnormality.  MRI of the brain was subsequently done, which  showed again no acute findings.  Atrophy was again noted.  No  significant stenosis are appreciated.  No clear etiology was found;  however, when repeat cardiac panel was done, her CK went up to 1721,  with slight elevation on MB, but her troponins remained normal.  It was  thought that the patient had acute  rhabdomyolysis possibly from her  fall.  It was noted that she was also on Lipitor, which can also cause  elevated CKs.  No other etiology has been found for her dizziness.  She  was not really orthostatic when this was checked as well.  I think the  patient is possibly deconditioned.  Maybe her dementia is worse, causing  multifactorial etiology for her symptoms.  In any case, the patient did  well during this hospitalization.  Her CKs trended down after she was  given some IV fluids.  She has been having good urine output.  She had  mild hypokalemia, which was also corrected.  Her H&H also remained  stable.   Today, the patient's vital signs showed slightly elevated blood pressure  which was, however, better than before.  Other parameters were all  stable.  She feels well.  She has been ambulating with the help of the  therapist, and hence, is considered stable for discharge.   She will need to use a walker  to ambulate from now on.  She might  benefit from some physical therapy at the nursing home as well.  Her  diabetes remains stable.  Dementia remained pretty much stable.  She did  have elevated blood pressure which required addition of antihypertensive  agents, but her blood pressures are better controlled, though not fully  optimal.   DISCHARGE MEDICATIONS:  1. Enteric-coated aspirin 81 mg daily.  2. Lasix 20 mg p.o. daily.  3. Potassium chloride 20 mEq 1 tablet daily.  4. Ferrex 150 mg b.i.d..  5. Metoprolol 25 mg b.i.d..  6. Namenda 10 mg b.i.d..  7. Hydralazine 15 mg p.o. t.i.d..  8. Prilosec 20 mg p.o. q.h.s..  9. Aricept 10 mg p.o. q.h.s..  10.Lexapro 20 mg p.o. q.h.s.Marland Kitchen  11.Novolin N 12 units subcu q.a.m.Marland Kitchen  12.Accu-Cheks q.a.c. and q.h.s.Marland Kitchen  13.Sliding scale insulin with Novolin R for blood sugars between 151-      200 give 2 units, 201-250 give 4 units, 251-300 give 6 units, 301-      350 give 8 units and call M.D.  14.Albuterol nebulizers 2.5 mg every 6 hours as  needed for wheezing.  15.Atrovent nebulizers 0.5 mg every 6 hours as needed for wheezing.  16.Combivent inhaler with spacer 2 puffs every 6 hours.  17.Norvasc 5 mg p.o. q.h.s..  18.Tylenol 650 mg p.o. q.4.h. p.r.n. for headaches and fever.   Please note we have for now discontinued her Lipitor.   Further recommendations are BMET and a total CK to be checked in one  week's time.  Decision about restarting Lipitor to be deferred to the  PMD.   FOLLOWUP:  With PMD as needed.   DIET:  Modified carbohydrate diet.   PHYSICAL ACTIVITY:  Patient will be safe with using a walker from now  on.  She may be able to ambulate without a walker; however, therapist  recommends a walker at all times.   We will recommend physical therapy ongoing at the nursing home.   CONSULTATIONS:  No consultations were obtained during this admission.   TOTAL TIME OF DISCHARGE:  40 minutes.      Osvaldo Shipper, MD  Electronically Signed     GK/MEDQ  D:  08/11/2006  T:  08/11/2006  Job:  [id number]

## 2011-01-25 NOTE — Procedures (Signed)
[facility]  Patient:    Nancy [name], Nancy [name] Visit Number: [id number] MRN: [id number]          Service Type: EMS Location: ED Attending Physician:  Herbert Seta Dictated by:   Kari Baars, M.D. Proc. Date: 09/03/01 Admit Date:  09/25/2001 Discharge Date: 09/25/2001                            EKG Interpretations   TIME:  2245 hours on September 03, 2001:  There is a pacemaker in place with occasional interpreted what appear to be junctional beats.  TIME:  2247 hours on September 03, 2001:  The rhythm appears to be a junctional rhythm with a rate of about 30.   TIME:  2248 hours on September 03, 2001:  The rythm is a junctional rhythm with a rate of about 30. There are ST-T wave changes diffusely.      IMPRESSION:  Abnormal electrocardiogram x 3. Dictated by:   Kari Baars, M.D. Attending Physician:  Herbert Seta DD:  09/26/01 TD:  09/28/01 Job: [id number]

## 2011-01-25 NOTE — Op Note (Signed)
NAME:  Nancy [name], Nancy [name]                      ACCOUNT NO.:  [id number]   MEDICAL RECORD NO.:  [id number]                   PATIENT TYPE:  AMB   LOCATION:  DAY                                  FACILITY:  [facility]   PHYSICIAN:  Lionel December, M.D.                 DATE OF BIRTH:  13-Aug-1925   DATE OF PROCEDURE:  12/06/2003  DATE OF DISCHARGE:                                 OPERATIVE REPORT   PROCEDURE:  Total colonoscopy with polypectomy.   ENDOSCOPIST:  Lionel December, M.D.   INDICATIONS:  [name] is a 77 year old African-American female with a  history of colon carcinoma who had left hemicolectomy in 1989 who is here  for surveillance exam.  Her last exam was over 4 years ago and the prep was  suboptimal.  Presently she does not have any GI symptoms.  The procedure and  risks were reviewed with the patient and informed consent was obtained.   PREOPERATIVE MEDICATIONS:  Versed 2 mg IV.   FINDINGS:  Procedure performed. In endoscopy suite.  The patient's vital  signs and O2 saturation were monitored during the procedure and remained  stable.  The patient was placed in the left lateral recumbent position and  rectal examination was performed.  No abnormality noted on external or  digital exam.   Olympus videoscope was placed in the rectum and advanced under vision into  the sigmoid colon and beyond.  Preparation was satisfactory.  Scope was  passed into the cecum which was identified by ileocecal valve and  appendiceal orifice.  There was a small sessile polyp to the right of the  appendiceal.  Approach to this was difficult.  A submucosal injection was  made raising it up.  This was snared and retrieved for histologic  examination.  There was another tiny bump on the other side of the  appendiceal orifice which was felt to be hyperplastic and was left alone.  As the scope was withdrawn the colonic mucosa was, once again, carefully  examined and was normal.  The rectal mucosa  similarly was normal.   The scope was retroflexed to examine the anorectal junction.  There were  hemorrhoids below the dentate line with erythema.  The endoscope was  straightened and withdrawn.  The patient tolerated the procedure well.   FINAL DIAGNOSES:  1. Small cecal polyp that was snared.  2. Small external hemorrhoids.   RECOMMENDATIONS:  1. Standard instructions given.  I will be contacting the patient with the     biopsy results.  2. Given today's findings she may consider waiting 5 years before her next     colonoscopy.      ___________________________________________  Lionel December, M.D.   NR/MEDQ  D:  12/06/2003  T:  12/06/2003  Job:  [id number]   cc:   Hanley Hays. Dechurch, M.D.  [address]  [place]  [place] [address]  Fax: [phone]   [facility] [place]

## 2011-01-25 NOTE — Cardiovascular Report (Signed)
[place]. [facility]  Patient:    Nancy [name], Nancy [name] Visit Number: [id number] MRN: [id number]          Service Type: CAT Location: [facility] 2853 [id number] Attending Physician:  Daisey Must Dictated by:   Daisey Must, M.D. [facility] Proc. Date: 05/13/01 Admit Date:  05/13/2001   CC:         Syliva Overman, M.D.  Dietrich Pates, M.D. [facility]  Cardiac Catheterization Laboratory   Cardiac Catheterization  PROCEDURES PERFORMED:  Left heart catheterization with coronary angiography and left ventriculography.  INDICATIONS:  The patient is a 77 year old woman, with multiple cardiovascular risk factors.  She has been having progressive exertional dyspnea.  A Persantine Cardiolite study showed evidence of prior apical infarction as well as ischemia in the anteroseptal and inferolateral distribution.  She was therefore referred for cardiac catheterization.  DESCRIPTION OF PROCEDURE:  A 6 French sheath was placed in the right femoral artery.  Standard Judkins 6 French catheters were utilized.  Contrast was Omnipaque.  There were no complications.  RESULTS:  HEMODYNAMICS:  Left ventricular pressure 182/10, aortic pressure 182/64. There was no aortic valve gradient.  LEFT VENTRICULOGRAM:  There is mild akinesis of the inferoapical wall. Otherwise, wall motion is normal.  Ejection fraction estimated at greater than or equal to 60%.  There is trace mitral regurgitation.  ABDOMINAL AORTOGRAM:  Abdominal aortogram reveals normal bilateral renal artery stenosis.  There is 25% stenosis in the right common iliac artery and 20% stenosis in the left common iliac artery.  Left subclavian has a 20% stenosis in the proximal portion.  The internal mammary artery is patent and intact.  CORONARY ARTERIOGRAPHY:  (Right dominant).  Left main:  Left main has minor luminal irregularities.  Left anterior descending:  The left anterior descending artery has a 40% stenosis in the  proximal vessel and 100% occlusion of the mid vessel just after a first diagonal branch.  The distal LAD fills by grade 2 left to left and right to left collaterals.  Prior to the occlusion of the mid LAD there is a large diagonal branch arising from the proximal LAD which has an 80% stenosis at its origin and an 80% stenosis in the mid vessel.  Left circumflex:  The left circumflex has a 50% stenosis in the mid vessel. There is a small OM-1 a 50% stenosis.  There is a normal sized OM-2 which is 100% occluded proximally and fills via grade 2-3 right to left and left to left collaterals.  There is a small third obtuse marginal branch.  Right coronary artery:  The right coronary artery is a dominant vessel.  There is a 40% stenosis in the proximal vessel followed by an 80% stenosis.  The distal right coronary artery gives rise to a normal sized posterior descending artery and small posterolateral branch.  IMPRESSIONS: 1. Left ventricular systolic function characterized by a mild inferior    apical akinesis but overall preserved ejection fraction. 2. Severe three-vessel coronary artery disease with chronic total    occlusions in the left anterior descending artery and obtuse marginal    as well as significant disease in the large diagonal branch and the    proximal right coronary artery.  PLAN:  The patient will be referred for evaluation for coronary artery bypass surgery. Dictated by:   Daisey Must, M.D. [facility] Attending Physician:  Daisey Must DD:  05/13/01 TD:  05/13/01 Job: [id number]

## 2011-01-25 NOTE — Op Note (Signed)
[place]. [facility]  Patient:    Nancy [name], Nancy [name] Visit Number: [id number] MRN: [id number]          Service Type: MED Location: 2300 2310 01 Attending Physician:  Waldo Laine Dictated by:   Gwenith Daily Tyrone Sage, M.D. Proc. Date: 05/15/01 Admit Date:  05/13/2001   CC:         Daisey Must, M.D. [facility]   Operative Report  PREOPERATIVE DIAGNOSIS:  Coronary occlusive disease.  POSTOPERATIVE DIAGNOSIS:  Coronary occlusive disease.  OPERATION PERFORMED:  Coronary artery bypass grafting x 4 with left internal mammary artery to the left anterior descending coronary artery, reversed saphenous vein graft to the diagonal coronary artery, reversed saphenous vein graft to the obtuse marginal coronary artery, reversed saphenous vein to the right coronary artery.  SURGEON:  Gwenith Daily. Tyrone Sage, M.D.  FIRST ASSISTANT:  Carlye Grippe.  ANESTHESIA:  General.  INDICATIONS FOR PROCEDURE:  The patient is a 77 year old female who has had increasing symptoms of shortness of breath and chest discomfort but primarily shortness of breath.  She sought medical attention because of this and was referred to see Dr. Gerri Spore.  Cardiac catheterization was performed which demonstrated significant three-vessel disease with subtotal occlusion of the LAD, a diffusely diseased long diagonal branch, subtotal occlusion of the circumflex and a right coronary artery with a proximal 80% lesion supplying collaterals both to the LAD and circumflex.  Overall ventricular function appeared preserved.  Because of the patients increase in symptoms of three-vessel coronary artery disease, coronary artery bypass grafting was recommended.  The patient agreed and signed informed consent.  DESCRIPTION OF PROCEDURE:  With Swan-Ganz and arterial line monitors in place, the patient underwent general endotracheal anesthesia without incident.  The skin of the chest and legs was  prepped with Betadine and draped in the usual sterile manner.  Initially attempted endo vein harvesting was performed on the right thigh.  However, a portion of vein was removed but was not all usable. Addition vein was harvested from the right lower extremity and this too was not completely usable.  Additional vein was harvested from the left lower extremity.  With vein removed from the left lower leg, right lower leg and right thigh, three satisfactory pieces of segments of vein were obtained.  A median sternotomy was performed and the left internal mammary artery was dissected down as a pedicle graft.  The distal artery was divided and had good free flow.  The pericardium was opened.  Overall ventricular function appeared preserved.  The patient was systemically heparinized.  The ascending aorta and the right atrium were cannulated in the aortic root.  A bent cardioplegia needle was introduced into the ascending aorta.  The patient was placed on cardiopulmonary bypass at 2.4 L per minute per meter squared.  Sites for anastomosis were selected and dissected out of the epicardium.  The patients body temperature was then cooled to 30 degrees.  The aortic crossclamp was applied.  500 cc of cold blood potassium cardioplegia was administered with rapid diastolic arrest of the heart.  Myocardial and septal temperature was monitored throughout the crossclamp period.  Attention was turned first to the distal right coronary artery which was opened and admitted a 1.5 mm probe.  Using a running 7-0 Prolene suture, distal anastomosis was performed.  Attention was then turned to the obtuse marginal coronary artery which was a small vessel but admitted a 1 mm probe distally and a 1.5 mm probe proximally.  Using  a running 7-0 Prolene, a distal anastomosis was performed with a segment of reversed saphenous vein graft. The midportion of the diagonal coronary artery was identified and opened and there was  evidence of distal diffuse disease.  A 1 mm probe passed distally and a 1.5 mm probe passed proximally.  Using a running 7-0 Prolene, a segment of reversed saphenous vein graft was anastomosed to the diagonal coronary artery.  Attention was then turned to the left anterior descending coronary artery which was opened in the midportion using a 1.5 mm probe passed proximally and distally.  Using a running 8-0 Prolene, the left internal mammary artery was anastomosed to the left anterior descending coronary artery.  With release of the Edwards bulldog on the mammary artery, there was appropriate rise in myocardial septal temperature.  The aortic cross clamp was removed.  Total cross clamp time was 50 minutes.  The patient required electrical defibrillation to return to sinus rhythm.  A partial occlusion clamp was placed on the ascending aorta.  Three punch aortotomies were performed.  Each of the three vein grafts were anastomosed to the ascending aorta.  Air was evacuated from the grafts.  The partial occlusion clamp was removed.  Sites of anastomosis were inspected and free of bleeding.  The patient was then ventilated and weaned from cardiopulmonary bypass without difficulty on low dose dopamine.  She remained hemodynamically stable and was decannulated in the usual fashion.  Protamine sulfate was administered.  With the operative field hemostatic, two atrial and two ventricular pacing wires were applied.  Graft markers were applied.  A left pleural tube and two mediastinal tubes were left in place.  The sternum was closed with #6 stainless steel wire, the fascia was closed with interrupted 0 Vicryl and running 3-0 Vicryl in the subcutaneous tissue and 4-0 subcuticular stitch in the skin edges.  Dry dressings were applied.  The sponge and needle counts were reported as correct at completion of the procedure.  The patient was transferred to the surgical intensive care unit for further  postoperative care.  The patient did require blood bank blood products because of low  hematocrit while on bypass. Dictated by:   Gwenith Daily Tyrone Sage, M.D. Attending Physician:  Waldo Laine DD:  05/18/01 TD:  05/18/01 Job: [id number]
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
## 2011-01-25 NOTE — Discharge Summary (Signed)
[name]. [facility]  Patient:    Nancy [name], Nancy [name] Visit Number: [id number] MRN: [id number]          Service Type: MED Location: 3000 2025 [id number] Attending Physician:  Corwin Levins Dictated by:   Janora Norlander, N.P. Admit Date:  09/04/2001 Discharge Date: 09/18/2001                             Discharge Summary  ADMITTING DIAGNOSES: 1. Unresponsive. 2. Bradycardia. 3. Hypotension. 4. Acidosis. 5. Leukocytosis. 6. Anemia. 7. Altered mental status.  DISCHARGE DIAGNOSES: 1. Coronary artery disease. 2. Non-insulin-dependent diabetes mellitus. 3. Hypertension. 4. Chronic renal insufficiency. 5. Hyperlipidemia. 6. Dementia.  SIGNIFICANT LABORATORY DATA:  On January 13, fecal occult blood negative.  On January 8, PT 14.6 and INR 1.2.  January 6, BMET with sodium 144, potassium 4.1, BUN 41, creatinine 1.3.  RPR nonreactive.  CBC on January 6 with white blood cell count 8.8, H&H 11.6 and 34.9 respectively.  Folate on January 3 was high at 788.  Blood culture with no growth x 5 days.  On January 3, ammonia 39, prealbumin 22.7.  Hepatic function panel was normal except for alk phosphatase slightly elevated at 120 and an ALT slightly elevated at 58.  TSH normal at 3.934.  Iron 98, TIBC 287, percent saturation 34.  September 21, 2001, chest x-ray with no active cardiopulmonary disease currently; right lower lobe density is resolved.  Lungs are currently clear. Prior CABG is noted and heart size is within normal limits considering [facility] projection.  Congestive heart failure has resolved.  CT of the head on December 27, impression:  Stable mild white matter changes consistent with small-vessel disease;  ethmoid and sphenoid sinus disease.  COURSE OF HOSPITALIZATION:  This pleasant 77 year old female was admitted in an unresponsive state, found to exhibit bradycardia.  She was ventilator-dependent for respiratory failure.  She was in cardiogenic  shock. She was extubated successfully and found to have multiple problems.  She was noted to be anemic on admission with an H&H of 8.6 and 25.4, and received 1 unit of packed red blood cells which, as she recovered, was enough to hold her well.  Her metabolic acidosis state was reversed while she was in the ICU. Cardiogenic shock was managed with dopamine and diuresis.  Still, the source of her respiratory failure and cardiogenic shock remained of an unclear etiology.  There was no evidence of an MI.  Her cardiac enzymes were negative. Her CAT scan was negative for a hemorrhagic stroke.  The question was whether or not this could be due to an embolic stroke, possibly a PE.  This was ruled out as well.  As the patient continued to improve in the early days of her hospitalization it was more and more thought that the respiratory and cardiogenic shock actually originated from CHF as the insult, and a diastolic dysfunction, because as she gently diuresed she continued to awaken and improve on all fronts.  She was started on vancomycin because there was an initial question of fever spikes and question of pneumonia.  She was found to have MRSA on December 31 and proceeded with a 10-day course of vancomycin. Vancomycin was started on December 30 and discontinued on January 13.  The patient is afebrile.  Chest x-ray is clear.  As the patient awakened more it was noted that she had difficulty speaking clearly and eating well.  She had been  living at an assisted care facility but it was unknown what her eating ability was.  We engaged speech and language pathology which found that she had poor lingual strength, that there were many teeth missing, that she had difficulty forming words, and in essence was an aspiration risk. Gastroenterology was consulted because maintenance of the Karie Soda would be difficult because the patient did pull the Panda tube out accidentally on several occasions.  To avoid  further trauma, GI was consulted for possibly placement of a PEG tube.  This was done on September 17, 2001.  The PEG tube currently is functioning well.  The patient is brighter and she appears stable for discharge at this point.  PHYSICAL EXAMINATION ON DISCHARGE:  GENERAL:  The patient states "I feel much better."  She is able to form one to four-word sentences.  She smiles and she is pleasant.  VITAL SIGNS:  Temperature 97.3, pulse 90, respirations 18, blood pressure 135/69, saturation 92% on room air.  MENTAL STATUS:  She is awake, she forms simple sentences.  HEART:  Regular rate and rhythm.  LUNGS:  Clear to auscultation.  EXTREMITIES:  Have no edema.  ABDOMEN:  Abdominal sounds are present in all four quadrants.  There is no distention.  The PEG tube site is clear without signs of infection.  #1 - METHICILLIN-RESISTANT STAPHYLOCOCCUS AUREUS:  Her chest x-ray is clear, no signs and symptoms of infection.  #2 - DIABETES:  She is under good control.  #3 - CORONARY ARTERY DISEASE:  Stable.  #4 - HYPERTENSION:  Stable.  DISPOSITION:  She is stable for discharge tomorrow morning.  MEDICATIONS ON DISCHARGE:  Medications are via her tube.  1. Prevacid 30 mg one q.d.  2. Lopressor 25 mg b.i.d.  3. Potassium chloride 20 mEq q.d.  4. Lasix 80 mg b.i.d.  5. Apresoline 10 to 20 mg q.6h.  6. Ventolin 2.5 mg q.6h. while awake.  7. Lantus 25 units q.h.s.  8. Sliding scale regimen with Humulin R.  9. Daily CBGs. 10. Glucerna 65 cc/hour. 11. Free water 100 cc t.i.d.  INSTRUCTIONS:  Activity per nursing home protocol.  We recommend continued physical and speech therapy, increased socialization, and follow-up laboratories per nursing home protocol. Dictated by:   Janora Norlander, N.P. Attending Physician:  Corwin Levins DD:  09/22/01 TD:  09/22/01 Job: [id number]

## 2011-01-27 LAB — GLUCOSE, CAPILLARY
Glucose-Capillary: 82 mg/dL (ref 70–99)
Glucose-Capillary: 169 mg/dL — ABNORMAL HIGH (ref 70–99)

## 2011-01-28 LAB — GLUCOSE, CAPILLARY: Glucose-Capillary: 81 mg/dL (ref 70–99)

## 2011-01-29 LAB — GLUCOSE, CAPILLARY: Glucose-Capillary: 105 mg/dL — ABNORMAL HIGH (ref 70–99)

## 2011-02-01 LAB — GLUCOSE, CAPILLARY: Glucose-Capillary: 141 mg/dL — ABNORMAL HIGH (ref 70–99)

## 2011-02-02 LAB — GLUCOSE, CAPILLARY: Glucose-Capillary: 97 mg/dL (ref 70–99)

## 2011-02-03 LAB — GLUCOSE, CAPILLARY
Glucose-Capillary: 85 mg/dL (ref 70–99)
Glucose-Capillary: 123 mg/dL — ABNORMAL HIGH (ref 70–99)

## 2011-02-05 LAB — GLUCOSE, CAPILLARY
Glucose-Capillary: 87 mg/dL (ref 70–99)
Glucose-Capillary: 164 mg/dL — ABNORMAL HIGH (ref 70–99)

## 2011-02-07 LAB — GLUCOSE, CAPILLARY: Glucose-Capillary: 107 mg/dL — ABNORMAL HIGH (ref 70–99)

## 2011-02-09 LAB — GLUCOSE, CAPILLARY
Glucose-Capillary: 96 mg/dL (ref 70–99)
Glucose-Capillary: 156 mg/dL — ABNORMAL HIGH (ref 70–99)

## 2011-02-10 LAB — GLUCOSE, CAPILLARY
Glucose-Capillary: 103 mg/dL — ABNORMAL HIGH (ref 70–99)
Glucose-Capillary: 169 mg/dL — ABNORMAL HIGH (ref 70–99)

## 2011-02-13 LAB — GLUCOSE, CAPILLARY
Glucose-Capillary: 92 mg/dL (ref 70–99)
Glucose-Capillary: 154 mg/dL — ABNORMAL HIGH (ref 70–99)

## 2011-02-14 LAB — GLUCOSE, CAPILLARY
Glucose-Capillary: 92 mg/dL (ref 70–99)
Glucose-Capillary: 118 mg/dL — ABNORMAL HIGH (ref 70–99)

## 2011-02-15 LAB — GLUCOSE, CAPILLARY
Glucose-Capillary: 110 mg/dL — ABNORMAL HIGH (ref 70–99)
Glucose-Capillary: 254 mg/dL — ABNORMAL HIGH (ref 70–99)

## 2011-02-19 LAB — GLUCOSE, CAPILLARY
Glucose-Capillary: 123 mg/dL — ABNORMAL HIGH (ref 70–99)
Glucose-Capillary: 201 mg/dL — ABNORMAL HIGH (ref 70–99)

## 2011-02-20 LAB — GLUCOSE, CAPILLARY: Glucose-Capillary: 154 mg/dL — ABNORMAL HIGH (ref 70–99)

## 2011-02-21 LAB — GLUCOSE, CAPILLARY: Glucose-Capillary: 90 mg/dL (ref 70–99)

## 2011-02-24 LAB — GLUCOSE, CAPILLARY
Glucose-Capillary: 88 mg/dL (ref 70–99)
Glucose-Capillary: 140 mg/dL — ABNORMAL HIGH (ref 70–99)

## 2011-02-25 LAB — GLUCOSE, CAPILLARY: Glucose-Capillary: 77 mg/dL (ref 70–99)

## 2011-02-26 LAB — GLUCOSE, CAPILLARY
Glucose-Capillary: 107 mg/dL — ABNORMAL HIGH (ref 70–99)
Glucose-Capillary: 153 mg/dL — ABNORMAL HIGH (ref 70–99)

## 2011-02-28 LAB — GLUCOSE, CAPILLARY: Glucose-Capillary: 112 mg/dL — ABNORMAL HIGH (ref 70–99)

## 2011-03-01 LAB — GLUCOSE, CAPILLARY
Glucose-Capillary: 93 mg/dL (ref 70–99)
Glucose-Capillary: 178 mg/dL — ABNORMAL HIGH (ref 70–99)

## 2011-03-02 LAB — GLUCOSE, CAPILLARY
Glucose-Capillary: 84 mg/dL (ref 70–99)
Glucose-Capillary: 107 mg/dL — ABNORMAL HIGH (ref 70–99)

## 2011-03-03 LAB — GLUCOSE, CAPILLARY
Glucose-Capillary: 97 mg/dL (ref 70–99)
Glucose-Capillary: 145 mg/dL — ABNORMAL HIGH (ref 70–99)

## 2011-03-04 LAB — GLUCOSE, CAPILLARY: Glucose-Capillary: 136 mg/dL — ABNORMAL HIGH (ref 70–99)

## 2011-03-05 LAB — GLUCOSE, CAPILLARY: Glucose-Capillary: 114 mg/dL — ABNORMAL HIGH (ref 70–99)

## 2011-03-06 LAB — GLUCOSE, CAPILLARY: Glucose-Capillary: 156 mg/dL — ABNORMAL HIGH (ref 70–99)

## 2011-03-07 LAB — GLUCOSE, CAPILLARY: Glucose-Capillary: 100 mg/dL — ABNORMAL HIGH (ref 70–99)

## 2011-03-08 LAB — GLUCOSE, CAPILLARY
Glucose-Capillary: 104 mg/dL — ABNORMAL HIGH (ref 70–99)
Glucose-Capillary: 137 mg/dL — ABNORMAL HIGH (ref 70–99)

## 2011-03-09 LAB — GLUCOSE, CAPILLARY: Glucose-Capillary: 124 mg/dL — ABNORMAL HIGH (ref 70–99)

## 2011-03-10 LAB — GLUCOSE, CAPILLARY: Glucose-Capillary: 82 mg/dL (ref 70–99)

## 2011-03-12 LAB — GLUCOSE, CAPILLARY
Glucose-Capillary: 141 mg/dL — ABNORMAL HIGH (ref 70–99)
Glucose-Capillary: 106 mg/dL — ABNORMAL HIGH (ref 70–99)

## 2011-03-13 LAB — GLUCOSE, CAPILLARY
Glucose-Capillary: 106 mg/dL — ABNORMAL HIGH (ref 70–99)
Glucose-Capillary: 130 mg/dL — ABNORMAL HIGH (ref 70–99)

## 2011-03-14 LAB — GLUCOSE, CAPILLARY: Glucose-Capillary: 106 mg/dL — ABNORMAL HIGH (ref 70–99)

## 2011-03-16 LAB — GLUCOSE, CAPILLARY: Glucose-Capillary: 158 mg/dL — ABNORMAL HIGH (ref 70–99)

## 2011-03-17 LAB — GLUCOSE, CAPILLARY
Glucose-Capillary: 109 mg/dL — ABNORMAL HIGH (ref 70–99)
Glucose-Capillary: 119 mg/dL — ABNORMAL HIGH (ref 70–99)

## 2011-03-18 LAB — GLUCOSE, CAPILLARY
Glucose-Capillary: 96 mg/dL (ref 70–99)
Glucose-Capillary: 187 mg/dL — ABNORMAL HIGH (ref 70–99)

## 2011-03-19 LAB — GLUCOSE, CAPILLARY: Glucose-Capillary: 96 mg/dL (ref 70–99)

## 2011-03-20 LAB — GLUCOSE, CAPILLARY: Glucose-Capillary: 148 mg/dL — ABNORMAL HIGH (ref 70–99)

## 2011-03-22 LAB — GLUCOSE, CAPILLARY
Glucose-Capillary: 98 mg/dL (ref 70–99)
Glucose-Capillary: 136 mg/dL — ABNORMAL HIGH (ref 70–99)

## 2011-03-23 LAB — GLUCOSE, CAPILLARY: Glucose-Capillary: 111 mg/dL — ABNORMAL HIGH (ref 70–99)

## 2011-03-25 LAB — GLUCOSE, CAPILLARY: Glucose-Capillary: 135 mg/dL — ABNORMAL HIGH (ref 70–99)

## 2011-03-27 LAB — GLUCOSE, CAPILLARY
Glucose-Capillary: 97 mg/dL (ref 70–99)
Glucose-Capillary: 227 mg/dL — ABNORMAL HIGH (ref 70–99)

## 2011-03-28 LAB — GLUCOSE, CAPILLARY: Glucose-Capillary: 139 mg/dL — ABNORMAL HIGH (ref 70–99)

## 2011-03-29 LAB — GLUCOSE, CAPILLARY: Glucose-Capillary: 157 mg/dL — ABNORMAL HIGH (ref 70–99)

## 2011-03-30 LAB — GLUCOSE, CAPILLARY: Glucose-Capillary: 198 mg/dL — ABNORMAL HIGH (ref 70–99)

## 2011-03-31 LAB — GLUCOSE, CAPILLARY: Glucose-Capillary: 190 mg/dL — ABNORMAL HIGH (ref 70–99)

## 2011-04-01 ENCOUNTER — Inpatient Hospital Stay (HOSPITAL_COMMUNITY)
Admission: EM | Admit: 2011-04-01 | Discharge: 2011-04-03 | DRG: 603 | Disposition: A | Discharge status: Skilled Nursing Facility | Payer: Medicare Other | Attending: Internal Medicine | Admitting: Internal Medicine

## 2011-04-01 ENCOUNTER — Encounter: Payer: Self-pay | Admitting: *Deleted

## 2011-04-01 ENCOUNTER — Emergency Department (HOSPITAL_COMMUNITY): Payer: Medicare Other

## 2011-04-01 DIAGNOSIS — L0201 Cutaneous abscess of face: Principal | ICD-10-CM | POA: Diagnosis present

## 2011-04-01 DIAGNOSIS — F3289 Other specified depressive episodes: Secondary | ICD-10-CM | POA: Diagnosis present

## 2011-04-01 DIAGNOSIS — I10 Essential (primary) hypertension: Secondary | ICD-10-CM | POA: Diagnosis present

## 2011-04-01 DIAGNOSIS — L03211 Cellulitis of face: Principal | ICD-10-CM | POA: Diagnosis present

## 2011-04-01 DIAGNOSIS — F32A Depression, unspecified: Secondary | ICD-10-CM | POA: Diagnosis present

## 2011-04-01 DIAGNOSIS — E119 Type 2 diabetes mellitus without complications: Secondary | ICD-10-CM | POA: Diagnosis present

## 2011-04-01 DIAGNOSIS — L03213 Periorbital cellulitis: Secondary | ICD-10-CM | POA: Diagnosis present

## 2011-04-01 DIAGNOSIS — F0390 Unspecified dementia without behavioral disturbance: Secondary | ICD-10-CM | POA: Diagnosis present

## 2011-04-01 DIAGNOSIS — A4902 Methicillin resistant Staphylococcus aureus infection, unspecified site: Secondary | ICD-10-CM | POA: Diagnosis present

## 2011-04-01 DIAGNOSIS — F329 Major depressive disorder, single episode, unspecified: Secondary | ICD-10-CM | POA: Diagnosis present

## 2011-04-01 DIAGNOSIS — E785 Hyperlipidemia, unspecified: Secondary | ICD-10-CM | POA: Diagnosis present

## 2011-04-01 HISTORY — DX: Depression, unspecified: F32.A

## 2011-04-01 HISTORY — DX: Anemia, unspecified: D64.9

## 2011-04-01 HISTORY — DX: Major depressive disorder, single episode, unspecified: F32.9

## 2011-04-01 HISTORY — DX: Cardiac arrhythmia, unspecified: I49.9

## 2011-04-01 HISTORY — DX: Essential (primary) hypertension: I10

## 2011-04-01 HISTORY — DX: Hypothyroidism, unspecified: E03.9

## 2011-04-01 HISTORY — DX: Syncope and collapse: R55

## 2011-04-01 HISTORY — DX: Atherosclerotic heart disease of native coronary artery without angina pectoris: I25.10

## 2011-04-01 HISTORY — DX: Unspecified mental disorder due to known physiological condition: F09

## 2011-04-01 LAB — DIFFERENTIAL
Basophils Relative: 0 % (ref 0–1)
Eosinophils Absolute: 0.2 10*3/uL (ref 0.0–0.7)
Eosinophils Relative: 2 % (ref 0–5)
Monocytes Relative: 8 % (ref 3–12)
Neutrophils Relative %: 59 % (ref 43–77)

## 2011-04-01 LAB — BASIC METABOLIC PANEL
BUN: 20 mg/dL (ref 6–23)
Calcium: 10.7 mg/dL — ABNORMAL HIGH (ref 8.4–10.5)
GFR calc Af Amer: >60 mL/min (ref >60)
GFR calc non Af Amer: 53 mL/min — ABNORMAL LOW (ref >60)
Glucose, Bld: 141 mg/dL — ABNORMAL HIGH (ref 70–99)
Potassium: 4.4 mEq/L (ref 3.5–5.1)
Sodium: 139 mEq/L (ref 135–145)

## 2011-04-01 LAB — CBC
MCH: 27.8 pg (ref 26.0–34.0)
MCHC: 32.1 g/dL (ref 30.0–36.0)
MCV: 86.5 fL (ref 78.0–100.0)
Platelets: 198 10*3/uL (ref 150–400)

## 2011-04-01 MED ORDER — VANCOMYCIN HCL IN DEXTROSE 1-5 GM/200ML-% IV SOLN
1000.0000 mg | INTRAVENOUS | Status: DC
  Filled 2011-04-01: qty 200

## 2011-04-01 MED ORDER — HYDROCHLOROTHIAZIDE 25 MG PO TABS
12.5000 mg | ORAL_TABLET | Freq: Every day | ORAL | Status: DC
  Administered 2011-04-01 – 2011-04-03 (×3): 12.5 mg via ORAL
  Filled 2011-04-01 (×3): qty 1

## 2011-04-01 MED ORDER — SODIUM CHLORIDE 0.9 % IJ SOLN: 3.0000 mL | INTRAMUSCULAR | Status: DC | PRN

## 2011-04-01 MED ORDER — IOHEXOL 300 MG/ML  SOLN
75.0000 mL | Freq: Once | INTRAMUSCULAR | Status: AC | PRN
  Administered 2011-04-01: 75 mL via INTRAVENOUS

## 2011-04-01 MED ORDER — VANCOMYCIN HCL IN DEXTROSE 1-5 GM/200ML-% IV SOLN
1000.0000 mg | Freq: Once | INTRAVENOUS | Status: AC
  Administered 2011-04-01: 1000 mg via INTRAVENOUS
  Filled 2011-04-01: qty 200

## 2011-04-01 MED ORDER — INSULIN DETEMIR 100 UNIT/ML ~~LOC~~ SOLN
10.0000 [IU] | Freq: Every day | SUBCUTANEOUS | Status: DC
  Administered 2011-04-01: 10 [IU] via SUBCUTANEOUS
  Filled 2011-04-01: qty 10

## 2011-04-01 MED ORDER — SIMVASTATIN 20 MG PO TABS
40.0000 mg | ORAL_TABLET | Freq: Every day | ORAL | Status: DC
  Administered 2011-04-01: 40 mg via ORAL
  Filled 2011-04-01: qty 2

## 2011-04-01 MED ORDER — ASPIRIN 81 MG PO CHEW
81.0000 mg | CHEWABLE_TABLET | Freq: Every day | ORAL | Status: DC
  Administered 2011-04-01 – 2011-04-03 (×3): 81 mg via ORAL
  Filled 2011-04-01 (×3): qty 1

## 2011-04-01 MED ORDER — VANCOMYCIN HCL IN DEXTROSE 1-5 GM/200ML-% IV SOLN
1000.0000 mg | INTRAVENOUS | Status: DC
  Administered 2011-04-02: 1000 mg via INTRAVENOUS
  Filled 2011-04-01: qty 200

## 2011-04-01 MED ORDER — ESCITALOPRAM OXALATE 10 MG PO TABS
5.0000 mg | ORAL_TABLET | Freq: Every day | ORAL | Status: DC
  Administered 2011-04-02 – 2011-04-03 (×2): 5 mg via ORAL
  Filled 2011-04-01 (×3): qty 1

## 2011-04-01 MED ORDER — SODIUM CHLORIDE 0.9 % IJ SOLN
10.0000 mL | Freq: Once | INTRAMUSCULAR | Status: AC
  Administered 2011-04-01: 10 mL via INTRAVENOUS
  Filled 2011-04-01: qty 10

## 2011-04-01 MED ORDER — FERROUS SULFATE 325 (65 FE) MG PO TABS
325.0000 mg | ORAL_TABLET | Freq: Two times a day (BID) | ORAL | Status: DC
  Administered 2011-04-01 – 2011-04-03 (×4): 325 mg via ORAL
  Filled 2011-04-01 (×4): qty 1

## 2011-04-01 MED ORDER — MEMANTINE HCL 10 MG PO TABS
10.0000 mg | ORAL_TABLET | Freq: Two times a day (BID) | ORAL | Status: DC
  Administered 2011-04-01 – 2011-04-03 (×4): 10 mg via ORAL
  Filled 2011-04-01 (×4): qty 1

## 2011-04-01 MED ORDER — AMLODIPINE BESYLATE 5 MG PO TABS
2.5000 mg | ORAL_TABLET | Freq: Every day | ORAL | Status: DC
  Administered 2011-04-01 – 2011-04-03 (×3): 2.5 mg via ORAL
  Filled 2011-04-01 (×3): qty 1

## 2011-04-01 MED ORDER — SODIUM CHLORIDE 0.9 % IJ SOLN
3.0000 mL | Freq: Two times a day (BID) | INTRAMUSCULAR | Status: DC
  Administered 2011-04-01 – 2011-04-03 (×3): 3 mL via INTRAVENOUS
  Filled 2011-04-01 (×4): qty 3

## 2011-04-01 MED ORDER — POTASSIUM CHLORIDE 10 MEQ PO TBCR
30.0000 meq | EXTENDED_RELEASE_TABLET | Freq: Every day | ORAL | Status: DC
  Administered 2011-04-02 – 2011-04-03 (×2): 30 meq via ORAL
  Filled 2011-04-01 (×6): qty 3

## 2011-04-01 MED ORDER — DONEPEZIL HCL 5 MG PO TABS
10.0000 mg | ORAL_TABLET | Freq: Every day | ORAL | Status: DC
  Administered 2011-04-02 – 2011-04-03 (×2): 10 mg via ORAL
  Filled 2011-04-01 (×2): qty 2

## 2011-04-01 NOTE — ED Notes (Signed)
Dr. Onalee Hua paged for Al Decant.

## 2011-04-01 NOTE — ED Provider Notes (Signed)
Medical screening examination/treatment/procedure(s) were conducted as a shared visit with non-physician practitioner(s) and myself.  I personally evaluated the patient during the encounter  R eye swollen, draining pus, CT show cellulitis and abscess. Will admit for IV Abx.   Nancy B. Bernette Mayers, MD 04/01/11 [phone]

## 2011-04-01 NOTE — ED Notes (Signed)
Pt from [facility]. Sent for evaluation for rt eye swelling and possible abscess. Rt eye swollen closed with yellow drainage present on eye lids/lashes. Pt unable to open eye. "knot" noted to rt inner corner of eye as well. Pt denies any pain when asked. Pt unsure of how long eye has been this way.

## 2011-04-01 NOTE — H&P (Signed)
  Job [id number]

## 2011-04-01 NOTE — ED Notes (Signed)
Redness from rt eye now extending to rt ear. Al Decant PA made aware. New orders obtained.

## 2011-04-01 NOTE — Plan of Care (Signed)
Problem: Phase I Progression Outcomes Goal: Initial discharge plan identified Outcome: Completed/Met Date Met:  04/01/11 Plan to d/c back to [facility] at time of discharge

## 2011-04-01 NOTE — ED Provider Notes (Signed)
History     Chief Complaint  Patient presents with  . swelling to right eye    The history is provided by the patient. No language interpreter was used.    Past Medical History  Diagnosis Date  . Cardiac dysrhythmia, unspecified   . Syncope   . Hypothyroidism   . Hypertension   . Diabetes mellitus   . Anemia   . Depressive disorder   . Organic brain syndrome   . CAD (coronary atherosclerotic disease)   . Lung disease     Past Surgical History  Procedure Date  . Unable     No family history on file.  History  Substance Use Topics  . Smoking status: Not on file  . Smokeless tobacco: Not on file  . Alcohol Use: No    OB History    Grav Para Term Preterm Abortions TAB SAB Ect Mult Living                  Review of Systems  Unable to perform ROS Constitutional: Negative for fever and chills.  Eyes: Positive for redness and visual disturbance. Negative for pain and itching.  All other systems reviewed and are negative.    Physical Exam  BP 135/54  Pulse 89  Temp(Src) 98.6 F (37 C) (Oral)  Resp 20  SpO2 99%  Physical Exam  Nursing note and vitals reviewed. Constitutional: She is oriented to person, place, and time. Vital signs are normal. She appears well-developed and well-nourished.  HENT:  Head: Normocephalic and atraumatic.  Right Ear: External ear normal.  Left Ear: External ear normal.  Nose: Nose normal.  Mouth/Throat: No oropharyngeal exudate.  Eyes: EOM are normal. Pupils are equal, round, and reactive to light. Right eye exhibits discharge. Left eye exhibits no discharge. No scleral icterus.       2 cm diam swollen lesion inferior and medial to R globe has ruptured on its own and draining large amount of purulent matter.  Pt able to move  R globe in all directions without pain.  + scleral injection.  Neck: Normal range of motion. Neck supple. No JVD present. No tracheal deviation present. No thyromegaly present.  Cardiovascular: Normal rate,  regular rhythm, normal heart sounds, intact distal pulses and normal pulses.  Exam reveals no gallop and no friction rub.   No murmur heard. Pulmonary/Chest: Effort normal and breath sounds normal. No stridor. No respiratory distress. She has no wheezes. She has no rales. She exhibits no tenderness.  Abdominal: Soft. Normal appearance and bowel sounds are normal. She exhibits no distension and no mass. There is no tenderness. There is no rebound and no guarding.  Musculoskeletal: Normal range of motion. She exhibits no edema and no tenderness.  Lymphadenopathy:    She has no cervical adenopathy.  Neurological: She is alert and oriented to person, place, and time. She has normal reflexes. No cranial nerve deficit. Coordination normal. GCS eye subscore is 4. GCS verbal subscore is 5. GCS motor subscore is 6.  Reflex Scores:      Tricep reflexes are 2+ on the right side and 2+ on the left side.      Bicep reflexes are 2+ on the right side and 2+ on the left side.      Brachioradialis reflexes are 2+ on the right side and 2+ on the left side.      Patellar reflexes are 2+ on the right side and 2+ on the left side.  Achilles reflexes are 2+ on the right side and 2+ on the left side. Skin: Skin is warm and dry. No rash noted. She is not diaphoretic.  Psychiatric: She has a normal mood and affect. Her speech is normal and behavior is normal. Judgment and thought content normal. Cognition and memory are normal.    ED Course  Procedures  MDM Pt is a poor historian.  She lives in a nursing facility.  She nor staff are able to elaborate on how long the swelling has been present.  Will CT orbit to investigate the extensiveness in the area of the globe.      Worthy Rancher, PA 04/23/11 [phone]

## 2011-04-01 NOTE — ED Notes (Signed)
Patient is sleeping in her room and is calm. Nothing needed at this time.

## 2011-04-01 NOTE — ED Notes (Signed)
Dr, Leanord Hawking had pt sent from [facility] for Swelling to Right eye for evaluation of possible orbital cellulitis. Nurse from [facility] was unable to say when swelling first began. Nurse from [facility] stated, "She may need to have an I&D"

## 2011-04-01 NOTE — ED Notes (Signed)
Pt noted to have used the bedpan but then rolled over on side, bed and clothing soaked. Pt also incontinent of moderate amt of stool. Pt given bath and clean dry linens on.

## 2011-04-01 NOTE — ED Notes (Signed)
Previous note entry wrong pt

## 2011-04-02 DIAGNOSIS — F329 Major depressive disorder, single episode, unspecified: Secondary | ICD-10-CM | POA: Diagnosis present

## 2011-04-02 DIAGNOSIS — E785 Hyperlipidemia, unspecified: Secondary | ICD-10-CM | POA: Diagnosis present

## 2011-04-02 DIAGNOSIS — I10 Essential (primary) hypertension: Secondary | ICD-10-CM | POA: Diagnosis present

## 2011-04-02 DIAGNOSIS — F0390 Unspecified dementia without behavioral disturbance: Secondary | ICD-10-CM | POA: Diagnosis present

## 2011-04-02 DIAGNOSIS — E119 Type 2 diabetes mellitus without complications: Secondary | ICD-10-CM | POA: Diagnosis present

## 2011-04-02 LAB — GLUCOSE, CAPILLARY
Glucose-Capillary: 116 mg/dL — ABNORMAL HIGH (ref 70–99)
Glucose-Capillary: 154 mg/dL — ABNORMAL HIGH (ref 70–99)

## 2011-04-02 LAB — BASIC METABOLIC PANEL
CO2: 26 mEq/L (ref 19–32)
Calcium: 10.5 mg/dL (ref 8.4–10.5)
Chloride: 103 mEq/L (ref 96–112)
Glucose, Bld: 128 mg/dL — ABNORMAL HIGH (ref 70–99)
Potassium: 3.8 mEq/L (ref 3.5–5.1)
Sodium: 140 mEq/L (ref 135–145)

## 2011-04-02 LAB — CBC
Hemoglobin: 10.9 g/dL — ABNORMAL LOW (ref 12.0–15.0)
MCH: 27.7 pg (ref 26.0–34.0)
MCV: 85.2 fL (ref 78.0–100.0)
Platelets: 205 10*3/uL (ref 150–400)
RBC: 3.93 MIL/uL (ref 3.87–5.11)
WBC: 6.6 10*3/uL (ref 4.0–10.5)

## 2011-04-02 LAB — MRSA PCR SCREENING: MRSA by PCR: INVALID — AB

## 2011-04-02 MED ORDER — VANCOMYCIN HCL IN DEXTROSE 1-5 GM/200ML-% IV SOLN
INTRAVENOUS | Status: AC
  Filled 2011-04-02: qty 200

## 2011-04-02 MED ORDER — INSULIN GLARGINE 100 UNIT/ML ~~LOC~~ SOLN
10.0000 [IU] | Freq: Every day | SUBCUTANEOUS | Status: DC
  Administered 2011-04-02: 10 [IU] via SUBCUTANEOUS
  Filled 2011-04-02: qty 3

## 2011-04-02 MED ORDER — ROSUVASTATIN CALCIUM 5 MG PO TABS
10.0000 mg | ORAL_TABLET | Freq: Every day | ORAL | Status: DC
  Administered 2011-04-02: 10 mg via ORAL
  Filled 2011-04-02: qty 2

## 2011-04-02 NOTE — Consult Note (Signed)
Reason for Consult: Right periorbital cellulitis with abscess Referring Physician: [facility] hospitalist  Nancy [name] is an 77 y.o. female.  HPI: Patient is a poor historian. History obtained from primary care physician admission. Incision  and drainage performed in the emergency room.  Past Medical History  Diagnosis Date  . Cardiac dysrhythmia, unspecified   . Syncope   . Hypothyroidism   . Hypertension   . Diabetes mellitus   . Anemia   . Depressive disorder   . Organic brain syndrome   . CAD (coronary atherosclerotic disease)   . Lung disease     Past Surgical History  Procedure Date  . Colon surgery   . Coronary artery bypass graft     History reviewed. No pertinent family history.  Social History:  reports that she has never smoked. She has never used smokeless tobacco. She reports that she does not drink alcohol or use illicit drugs.  Allergies: No Known Allergies  Medications: I have reviewed the patient's current medications.  Results for orders placed during the hospital encounter of 04/01/11 (from the past 48 hour(s))  GLUCOSE, CAPILLARY     Status: Abnormal   Collection Time   04/01/11  5:02 AM      Component Value Range Comment   Glucose-Capillary 141 (*) 70 - 99 (mg/dL)   CBC     Status: Abnormal   Collection Time   04/01/11  2:31 PM      Component Value Range Comment   WBC 9.6  4.0 - 10.5 (K/uL)    RBC 4.21  3.87 - 5.11 (MIL/uL)    Hemoglobin 11.7 (*) 12.0 - 15.0 (g/dL)    HCT 54.0  98.1 - 19.1 (%)    MCV 86.5  78.0 - 100.0 (fL)    MCH 27.8  26.0 - 34.0 (pg)    MCHC 32.1  30.0 - 36.0 (g/dL)    RDW 47.8  29.5 - 62.1 (%)    Platelets 198  150 - 400 (K/uL)   DIFFERENTIAL     Status: Normal   Collection Time   04/01/11  2:31 PM      Component Value Range Comment   Neutrophils Relative 59  43 - 77 (%)    Neutro Abs 5.7  1.7 - 7.7 (K/uL)    Lymphocytes Relative 31  12 - 46 (%)    Lymphs Abs 3.0  0.7 - 4.0 (K/uL)    Monocytes Relative 8  3 - 12 (%)     Monocytes Absolute 0.8  0.1 - 1.0 (K/uL)    Eosinophils Relative 2  0 - 5 (%)    Eosinophils Absolute 0.2  0.0 - 0.7 (K/uL)    Basophils Relative 0  0 - 1 (%)    Basophils Absolute 0.0  0.0 - 0.1 (K/uL)   BASIC METABOLIC PANEL     Status: Abnormal   Collection Time   04/01/11  2:31 PM      Component Value Range Comment   Sodium 139  135 - 145 (mEq/L)    Potassium 4.4  3.5 - 5.1 (mEq/L) SLIGHT HEMOLYSIS   Chloride 102  96 - 112 (mEq/L)    CO2 23  19 - 32 (mEq/L)    Glucose, Bld 141 (*) 70 - 99 (mg/dL)    BUN 20  6 - 23 (mg/dL)    Creatinine, Ser 3.08  0.50 - 1.10 (mg/dL)    Calcium 65.7 (*) 8.4 - 10.5 (mg/dL)    GFR calc non Af  Amer 53 (*) >60 (mL/min)    GFR calc Af Amer >60  >60 (mL/min)   CULTURE, ROUTINE-ABSCESS     Status: Normal (Preliminary result)   Collection Time   04/01/11  5:07 PM      Component Value Range Comment   Specimen Description ABSCESS RIGHT EYELID      Special Requests NONE      Gram Stain        Value: ABUNDANT WBC PRESENT, PREDOMINANTLY PMN     NO SQUAMOUS EPITHELIAL CELLS SEEN     FEW GRAM POSITIVE COCCI IN PAIRS     IN CLUSTERS   Culture NO GROWTH      Report Status PENDING     GLUCOSE, CAPILLARY     Status: Abnormal   Collection Time   04/01/11  9:52 PM      Component Value Range Comment   Glucose-Capillary 178 (*) 70 - 99 (mg/dL)   MRSA PCR SCREENING     Status: Abnormal   Collection Time   04/02/11  1:35 AM      Component Value Range Comment   MRSA by PCR INVALID RESULTS, SPECIMEN SENT FOR CULTURE (*) NEGATIVE    BASIC METABOLIC PANEL     Status: Abnormal   Collection Time   04/02/11  4:48 AM      Component Value Range Comment   Sodium 140  135 - 145 (mEq/L)    Potassium 3.8  3.5 - 5.1 (mEq/L)    Chloride 103  96 - 112 (mEq/L)    CO2 26  19 - 32 (mEq/L)    Glucose, Bld 128 (*) 70 - 99 (mg/dL)    BUN 20  6 - 23 (mg/dL)    Creatinine, Ser 1.47  0.50 - 1.10 (mg/dL)    Calcium 82.9  8.4 - 10.5 (mg/dL)    GFR calc non Af Amer 56 (*) >60  (mL/min)    GFR calc Af Amer >60  >60 (mL/min)   CBC     Status: Abnormal   Collection Time   04/02/11  4:48 AM      Component Value Range Comment   WBC 6.6  4.0 - 10.5 (K/uL)    RBC 3.93  3.87 - 5.11 (MIL/uL)    Hemoglobin 10.9 (*) 12.0 - 15.0 (g/dL)    HCT 56.2 (*) 13.0 - 46.0 (%)    MCV 85.2  78.0 - 100.0 (fL)    MCH 27.7  26.0 - 34.0 (pg)    MCHC 32.5  30.0 - 36.0 (g/dL)    RDW 86.5  78.4 - 69.6 (%)    Platelets 205  150 - 400 (K/uL)   GLUCOSE, CAPILLARY     Status: Abnormal   Collection Time   04/02/11  7:31 AM      Component Value Range Comment   Glucose-Capillary 116 (*) 70 - 99 (mg/dL)     No results found.  ROS: As listed on chart Blood pressure 133/61, pulse 77, temperature 98.2 F (36.8 C), temperature source Oral, resp. rate 18, height 5\' 3"  (1.6 m), weight 72.349 kg (159 lb 8 oz), SpO2 98.00%. Physical Exam: HEENT: Mild erythema around the medial, inferior aspect of the periorbital region. Eye exam reveals no conjunctivitis or limitation in range of motion. No significant purulent drainage expressed.  Assessment/Plan: Periorbital cellulitis\abscess-resolving  Plan: No further procedure is needed from my standpoint. We'll continue antibiotic therapy pending culture results. Would recommend outpatient followup with ophthalmology. We'll follow with you.  Franky Macho  A 04/02/2011, 10:46 AM

## 2011-04-02 NOTE — Progress Notes (Signed)
Subjective: Patient with chronic dementia and therefore not a good historian. She denies any complaints. She denies any eye pain or vision problems.  Objective: Weight change:   Intake/Output Summary (Last 24 hours) at 04/02/11 1549 Last data filed at 04/02/11 0900  Gross per 24 hour  Intake    320 ml  Output      0 ml  Net    320 ml   BP 121/76  Pulse 72  Temp(Src) 97.4 F (36.3 C) (Oral)  Resp 18  Ht 5\' 3"  (1.6 m)  Wt 72.349 kg (159 lb 8 oz)  BMI 28.25 kg/m2  SpO2 97% General appearance: alert, cooperative, appears older than stated age and no distress Head: Normocephalic, without obvious abnormality, atraumatic, some mild swelling below left eye  Eyes: negative Lungs: clear to auscultation bilaterally Heart: regular rate and rhythm, S1, S2 normal, no murmur, click, rub or gallop, soft 2/6 SEM Abdomen: soft, non-tender; bowel sounds normal; no masses,  no organomegaly Extremities: extremities normal, atraumatic, no cyanosis or edema Pulses: 1+ Skin: Skin color, texture, turgor normal. No rashes or lesions      Lab Results:  [facility] 04/02/11 0448 Apr 16, 2011 1431  NA 140 139  K 3.8 4.4  CL 103 102  CO2 26 23  GLUCOSE 128* 141*  BUN 20 20  CREATININE 0.96 1.00  CALCIUM 10.5 10.7*  MG -- --  PHOS -- --       Basename 04/02/11 0448 04-16-2011 1431  WBC 6.6 9.6  NEUTROABS -- 5.7  HGB 10.9* 11.7*  HCT 33.5* 36.4  MCV 85.2 86.5  PLT 205 198   Studies/Results: Ct Orbits W/cm  04/16/11     IMPRESSION:  1.  Prominent right periorbital cellulitis and periorbital abscess with reported drainage to the skin.  The cellulitis involves a considerable portion of the right facial soft tissues.  No intraorbital extension is identified. 2.  Chronic right mastoid effusion with a small amount of fluid suspected in the right middle ear. 3.  Minimal chronic right maxillary sinusitis.  Original Report Authenticated By: Dellia Cloud, M.D.   Medications: Scheduled Meds:   .  amLODipine  2.5 mg Oral Daily  . aspirin  81 mg Oral Daily  . donepezil  10 mg Oral Daily  . escitalopram  5 mg Oral Daily  . ferrous sulfate  325 mg Oral BID  . hydrochlorothiazide  12.5 mg Oral Daily  . insulin glargine  10 Units Subcutaneous QHS  . memantine  10 mg Oral BID  . potassium chloride  30 mEq Oral Daily  . rosuvastatin  10 mg Oral QHS  . sodium chloride  3 mL Intravenous Q12H  . vancomycin  1,000 mg Intravenous Once  . vancomycin  1,000 mg Intravenous Q24H  . DISCONTD: insulin detemir  10 Units Subcutaneous QHS  . DISCONTD: simvastatin  40 mg Oral QHS  . DISCONTD: vancomycin  1,000 mg Intravenous Q24H   Continuous Infusions:  PRN Meds:.iohexol, sodium chloride  Assessment/Plan: Patient Active Hospital Problem List: Periorbital cellulitis (2011/04/16)   Assessment: Recurrent. Currently tolerating IV antibiotics. No evidence of orbital involvement. Have consulted Dr. Lita Mains, who is patient's ophthalmologist.  He will come see patient.     DM type 2 (diabetes mellitus, type 2) (04/02/2011)   Assessment: Monitor CBGs and cover with sliding scale   HTN (hypertension) (04/02/2011)   Assessment: Stable    Dementia (04/02/2011)   Assessment: Stable  Hyperlipemia (04/02/2011)   Assessment: Stable continue medicines  Depression (04/02/2011)  Assessment: Stable   LOS: 1 day   Hollice Espy 04/02/2011, 3:49 PM

## 2011-04-02 NOTE — H&P (Signed)
Nancy [name], Nancy [name]             ACCOUNT NO.:  [id number]  MEDICAL RECORD NO.:  [id number]  LOCATION:  A329                          FACILITY:  [facility]  PHYSICIAN:  Tarry Kos, MD       DATE OF BIRTH:  01-26-1925  DATE OF ADMISSION:  04/01/2011 DATE OF DISCHARGE:  LH                             HISTORY & PHYSICAL   CHIEF COMPLAINT:  Right eye swelling and redness.  HISTORY OF PRESENT ILLNESS:  Nancy [name] is an 77 year old African American female with a history of dementia who lives in a nursing home, who was into the ED because of purulent discharge and swelling and cellulitis around her right eye.  Nancy [name] has no children and her nearest next of kin is her niece who is present with her right now in the emergency department.  Apparently, Nancy [name] has been having progressively worsening swelling with now discharge that is purulent to the right eye over the last 3 days.  They were called by the nursing home which told that she was being sent to the ED and apparently, Nancy [name], this is about the fourth infection over the last year that she has had in the same exact area.  She has actually seen an ophthalmologist and she was told that she had a core in there that was causing this infection over and over below her right eye.  The family is not aware that she has had any orbital involvement, but she continues to get this abscess that gets infected underneath her right eye.  Nancy [name] does not complain of any pain.  The family does not think she is in any pain.  However, she is demented and the history is somewhat unreliable.  They did not know that she has been running any fevers. She has not been having any other complaints.  She has not been complaining that her eyes hurts.  When you ask Nancy [name] right now if her eyes hurting, she says no.  When you ask her how she is doing, she says she is doing fine.  Again due to her dementia, history for the patient is  unreliable.  REVIEW OF SYSTEMS:  Otherwise negative.  PAST MEDICAL HISTORY.: 1. Again recurrent right periorbital abscess and cellulitis of unclear     etiology, suspect there is underlying structural cause. 2. Dementia. 3. Bed-bound and wheelchair-bound state for several years now.  The     patient does not ambulate at her baseline. 4. Coronary artery disease. 5. Hyperlipidemia. 6. Depression. 7. Diabetes. 8. Hypertension.  ALLERGIES:  None.  SOCIAL HISTORY:  She is negative x3.  Again, she lives in nursing home. She is a full code.  This has been verified with her niece.  MEDICATIONS: 1. Amlodipine 2.5 mg daily. 2. Aspirin 81 mg a day. 3. Recently started on ceftriaxone 1 g daily to end on July 27. 4. Aricept 10 mg daily. 5. Lexapro 5 mg daily. 6. Iron sulfate 325 mg twice a day. 7. HCTZ 12.5 mg daily. 8. Levemir 10 units subcu at bedtime. 9. Namenda 10 mg 2 tablets daily. 10.Metformin 850 mg twice a day with food. 11.KCl 30 mEq daily. 12.Zocor 40 mg  at bedtime.  PHYSICAL EXAMINATION:  VITAL SIGNS:  She is afebrile.  Temperature is 98, pulse 80, respirations 18, blood pressure 123/75, and 97% O2 saturation on room air. GENERAL:  She is alert.  She is in no apparent distress.  She is oriented to person only. HEENT:  Extraocular muscles are intact.  Pupils equal and reactive to light.  Oropharynx clear.  Mucous membrane moist.  She has full range of motion of her bilateral orbits.  Her right periorbital area is swollen. She has got obvious purulent discharge from the eye with surrounding cellulitis.  I do not palpate any fluctuance, however, there is some induration in the anterior lower aspect of the right periorbital area close to her nasal bridge. COR:  Regular rate and rhythm without murmurs or gallops. CHEST:  Clear to auscultation bilaterally.  No wheeze, rhonchi, or rales. ABDOMEN:  Soft, nontender, and nondistended.  Positive bowel sounds.   No hepatosplenomegaly. EXTREMITIES:  No clubbing, cyanosis, or edema. PSYCHIATRIC:  Normal mood and affect. NEUROLOGIC:  No focal neurologic deficits.  LABORATORY DATA:  Her electrolytes are normal.  Calcium is slightly elevated at 10.7.  Glucose 141.  White count is normal.  Hemoglobin is 11.7.  She did have a CT of her orbits done which showed prominent right periorbital cellulitis and periorbital abscess.  No intraorbital extension identified.  ASSESSMENT AND PLAN:  This is an 77 year old female with right periorbital abscess. 1. Right periorbital abscess.  Cultures already been sent off by the     emergency room physician.  We will place her on vancomycin and also     obtain a General Surgery consultation to see if this needs to be     further incised.  The family has been told that she might need     surgery to explore this area.  I think this warrants some further     investigation due to this being her fourth infection in the same     exact spot over the last year.  I suspect there is an underlying     structural the issue, however, that is not clear.  The family has     been told that she has got a core there that is causing this, not     sure that is cyst or some sort of tract.  We will await for General     Surgery's further recommendations.  She has seen an ophthalmologist     in the past, however, I cannot find those notes. 2. Diabetes.  Continue her Lantus and hold her metformin for now. 3. History of coronary artery disease.  This is stable. 4. Dementia.  This is also stable. 5. Chronic debilitating state.  We will obtain a physical therapy     evaluation and treatment while she is here in the hospital. 6. The patient's advocate says she is missing family members.  She has     got a niece and a great niece present very     upset as the patient has been waiting in the ED for over 8 hours     now due to some communication issues in the emergency room.  The     patient's  advocate has discussed this with the family to try to     alleviate their concerns. 7. The patient is full code.  This has been confirmed by family.  ______________________________ Tarry Kos, MD     RD/MEDQ  D:  04/01/2011  T:  04/02/2011  Job:  [id number]
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
## 2011-04-03 ENCOUNTER — Inpatient Hospital Stay
Admission: RE | Admit: 2011-04-03 | Discharge: 2011-05-14 | Disposition: A | Discharge status: Other Acute Inpatient Hospital | Payer: Federal, State, Local not specified - PPO | Source: Ambulatory Visit | Attending: Internal Medicine | Admitting: Internal Medicine

## 2011-04-03 LAB — GLUCOSE, CAPILLARY
Glucose-Capillary: 244 mg/dL — ABNORMAL HIGH (ref 70–99)
Glucose-Capillary: 425 mg/dL — ABNORMAL HIGH (ref 70–99)

## 2011-04-03 MED ORDER — LEVOFLOXACIN 750 MG PO TABS: 750.0000 mg | ORAL_TABLET | Freq: Every day | ORAL | Status: AC

## 2011-04-03 MED ORDER — VANCOMYCIN HCL IN DEXTROSE 1-5 GM/200ML-% IV SOLN: 1000.0000 mg | INTRAVENOUS | Status: DC

## 2011-04-03 NOTE — Progress Notes (Signed)
Subjective: This lady was admitted with right periorbital cellulitis. There was no involvement intraorbitally. She says she feels well and her eye is better.           Physical Exam: The vital signs ZOX:WRUE:  [97.4 F (36.3 C)-98.1 F (36.7 C)] 98.1 F (36.7 C) (07/25 0519) Pulse Rate:  [61-74] 74  (07/25 0519) Resp:  [18] 18  (07/25 0519) BP: (121-149)/(76-83) 136/76 mmHg (07/25 0519) SpO2:  [97 %-98 %] 98 % (07/25 0519) She looks systemically well. She is not toxic/septic. Heart sounds are present and normal. Lung fields are clear. Her right eye is not particularly inflamed at present.   Investigations: Results for orders placed during the hospital encounter of 04/01/11 (from the past 24 hour(s))  GLUCOSE, CAPILLARY     Status: Abnormal   Collection Time   04/02/11 11:12 AM      Component Value Range   Glucose-Capillary 253 (*) 70 - 99 (mg/dL)  GLUCOSE, CAPILLARY     Status: Abnormal   Collection Time   04/02/11  4:58 PM      Component Value Range   Glucose-Capillary 154 (*) 70 - 99 (mg/dL)   Comment 1 Notify RN     Comment 2 Documented in Chart    GLUCOSE, CAPILLARY     Status: Abnormal   Collection Time   04/03/11  7:53 AM      Component Value Range   Glucose-Capillary 134 (*) 70 - 99 (mg/dL)   Recent Results (from the past 240 hour(s))  CULTURE, ROUTINE-ABSCESS     Status: Normal (Preliminary result)   Collection Time   04/01/11  5:07 PM      Component Value Range Status Comment   Specimen Description ABSCESS RIGHT EYELID   Final    Special Requests NONE   Final    Gram Stain     Final    Value: ABUNDANT WBC PRESENT, PREDOMINANTLY PMN     NO SQUAMOUS EPITHELIAL CELLS SEEN     FEW GRAM POSITIVE COCCI IN PAIRS     IN CLUSTERS   Culture MODERATE STAPHYLOCOCCUS SPECIES   Final    Report Status PENDING   Incomplete   MRSA PCR SCREENING     Status: Abnormal   Collection Time   04/02/11  1:35 AM      Component Value Range Status Comment   MRSA by PCR INVALID  RESULTS, SPECIMEN SENT FOR CULTURE (*) NEGATIVE  Final        Medications: I have reviewed the patient's current medications.  Impression:  1. Periorbital cellulitis. 2. Moderate to severe dementia. 3. Diabetes, reasonable control. 4. Hypertension, controlled. 5. Depression, stable.      Plan: 1. Continue antibiotics with vancomycin. Cultures are growing staphylococcal aureus, await full identification. 2. Await ophthalmology opinion.     LOS: 2 days   GOSRANI,NIMISH C 04/03/2011, 8:37 AM

## 2011-04-03 NOTE — Progress Notes (Signed)
Writer called report to [employer], lpn at [employer] [employer]  Pt wheel chaired to [facility] in stable condition and in no distress.  Packet took by staff personell and given to nurse at [employer].

## 2011-04-03 NOTE — Discharge Summary (Signed)
Physician Discharge Summary  Patient ID: Nancy [name] MRN: [id number] DOB/AGE: [id number] 75 y.o. Primary Care Physician:No primary provider on file. Admit date: 04/01/2011 Discharge date: 04/03/2011    Discharge Diagnoses:  1. Periorbital cellulitis, improved. Staphylococcal species identified. 2. Moderate to severe dementia. Stable. 3. Diabetes mellitus, stable. 4. Hypertension, controlled. 5. Hyperlipidemia. 6. Depression, stable.   Current Discharge Medication List    START taking these medications   Details  levofloxacin (LEVAQUIN) 750 MG tablet Take 1 tablet (750 mg total) by mouth daily. Qty: 7 tablet, Refills: 0      CONTINUE these medications which have NOT CHANGED   Details  amLODipine (NORVASC) 2.5 MG tablet Take 2.5 mg by mouth daily.      aspirin 325 MG EC tablet Take 325 mg by mouth daily.      donepezil (ARICEPT) 10 MG tablet Take 10 mg by mouth 1 day or 1 dose. Daily med    escitalopram (LEXAPRO) 5 MG tablet Take 5 mg by mouth daily.      ferrous sulfate 325 (65 FE) MG tablet Take 325 mg by mouth 2 (two) times daily.      hydrochlorothiazide (,MICROZIDE/HYDRODIURIL,) 12.5 MG capsule Take 12.5 mg by mouth daily.      insulin detemir (LEVEMIR) 100 UNIT/ML injection Inject 10 Units into the skin at bedtime.      memantine (NAMENDA) 10 MG tablet Take 10 mg by mouth 2 (two) times daily.      metFORMIN (GLUCOPHAGE) 850 MG tablet Take 850 mg by mouth 2 (two) times daily with a meal.      OVER THE COUNTER MEDICATION Take 1 Can by mouth every morning. Sugar-Free Healthy Shakes     potassium chloride (KLOR-CON) 10 MEQ CR tablet Take 30 mEq by mouth daily.      simvastatin (ZOCOR) 40 MG tablet Take 40 mg by mouth at bedtime.        STOP taking these medications     aspirin 81 MG tablet      cefTRIAXone (ROCEPHIN) 1 G injection      metFORMIN (GLUMETZA) 1000 MG (MOD) 24 hr tablet         Discharged Condition: Stable and improved.    Consults:  Ophthalmology.  Significant Diagnostic Studies: Ct Orbits W/cm  04/01/2011  *RADIOLOGY REPORT*  Clinical Data: Right thigh swelling with yellow drainage from the inner canthus region.  Periorbital erythema on the right extending to the right ear.  CT ORBITS WITH CONTRAST  Technique:  Multidetector CT imaging of the orbits was performed following the bolus administration of intravenous contrast.  Contrast: 75 ml Omnipaque-300  Comparison: 08/07/2008  Findings: Extensive right periorbital soft tissue swelling and enhancement noted, with some central lucency along the inferior/medial periorbital region on images 24-25 of series 2, suspicious for periorbital abscess.  This is reportedly the region of the drainage to the skin.  No definite postseptal/intraorbital extension of the inflammatory process is identified.  The left lens is not well visualized; otherwise the globes appear symmetric.  The abnormal cutaneous and subcutaneous edema and enhancement extends from the periorbital region on the right and in the subcutaneous tissues anterior to the right maxillary sinus, and also superficial to the right zygomatic arch and towards the right ear.  Mandibular teeth noted but the maxilla is edentulous.  We do not demonstrate opacification of the frontal sinuses or ethmoid air cells.  However, there is a chronic right mastoid effusion along with suspected fluid in the right middle  ear.  No overt otitis externa.  There is a small mucous retention cyst in the right maxillary sinus.  IMPRESSION:  1.  Prominent right periorbital cellulitis and periorbital abscess with reported drainage to the skin.  The cellulitis involves a considerable portion of the right facial soft tissues.  No intraorbital extension is identified. 2.  Chronic right mastoid effusion with a small amount of fluid suspected in the right middle ear. 3.  Minimal chronic right maxillary sinusitis.  Original Report Authenticated By: Dellia Cloud, M.D.      Lab Results: Results for orders placed during the hospital encounter of 04/01/11 (from the past 24 hour(s))  GLUCOSE, CAPILLARY     Status: Abnormal   Collection Time   04/02/11  4:58 PM      Component Value Range   Glucose-Capillary 154 (*) 70 - 99 (mg/dL)   Comment 1 Notify RN     Comment 2 Documented in Chart    GLUCOSE, CAPILLARY     Status: Abnormal   Collection Time   04/03/11  7:53 AM      Component Value Range   Glucose-Capillary 134 (*) 70 - 99 (mg/dL)  GLUCOSE, CAPILLARY     Status: Abnormal   Collection Time   04/03/11 11:36 AM      Component Value Range   Glucose-Capillary 244 (*) 70 - 99 (mg/dL)   Recent Results (from the past 240 hour(s))  CULTURE, ROUTINE-ABSCESS     Status: Normal (Preliminary result)   Collection Time   04/01/11  5:07 PM      Component Value Range Status Comment   Specimen Description ABSCESS RIGHT EYELID   Final    Special Requests NONE   Final    Gram Stain     Final    Value: ABUNDANT WBC PRESENT, PREDOMINANTLY PMN     NO SQUAMOUS EPITHELIAL CELLS SEEN     FEW GRAM POSITIVE COCCI IN PAIRS     IN CLUSTERS   Culture MODERATE STAPHYLOCOCCUS SPECIES   Final    Report Status PENDING   Incomplete   MRSA PCR SCREENING     Status: Abnormal   Collection Time   04/02/11  1:35 AM      Component Value Range Status Comment   MRSA by PCR INVALID RESULTS, SPECIMEN SENT FOR CULTURE (*) NEGATIVE  Final   MRSA CULTURE     Status: Normal (Preliminary result)   Collection Time   04/02/11  1:35 AM      Component Value Range Status Comment   Specimen Description NOSE   Final    Special Requests NONE   Final    Culture Culture reincubated for better growth   Final    Report Status PENDING   Incomplete      Hospital Course: This 77 year old demented lady was admitted with swelling and purulent discharge around her right thigh. Please see initial history and physical examination. She was diagnosed with periorbital cellulitis. She was seen by general  surgery who did not feel there was anything to add since it was spontaneously discharging. They recommended to continue with antibiotics. Culture of the discharge revealed staphylococcus species, she was started on intravenous vancomycin. She had a good response. However, she continues to pull out her IV access continually. Therefore since she is apyrexial and looks clinically stable, it has been elected to switch her to oral antibiotics. She has been started on Levaquin and this should be continued for further week or so. Levaquin should  cover staphylococcal species.  Discharge Exam: Blood pressure 127/65, pulse 74, temperature 98.1 F (36.7 C), temperature source Oral, resp. rate 18, height 5\' 3"  (1.6 m), weight 72.349 kg (159 lb 8 oz), SpO2 98.00%. She looks systemically well. She is not clinically toxic or septic. Heart sounds are present and normal. Lung fields are clear. Abdomen is soft and nontender. Neurologically, she is alert and orientated in place. She appears to be at her baseline mental state. There is very minimal swelling around the right eye at the present time and per nursing staff there has been great improvement.  Disposition: Skilled nursing facility. She will need another week of oral Levaquin. She needs followup with the ophthalmologist Dr. Lita Mains in one week.  Discharge Orders    Future Orders Please Complete By Expires   Diet - low sodium heart healthy      Increase activity slowly         Follow-up Information    Follow up with HAINES,CARROLL F. Make an appointment in 1 week.   Contact information:   [address] [place] [address] [phone]          Signed: Wilson Singer 04/03/2011, 12:46 PM

## 2011-04-03 NOTE — Consult Note (Signed)
Pt d/c today by MD back to [facility].  Pt not oriented. Family notified by voicemail.  Facility is aware and agreeable. Pt to transfer with RN.  Karn Cassis

## 2011-04-03 NOTE — Progress Notes (Addendum)
04/03/11 1101  Inpatient Diabetes Program Recommendations  Correction (SSI) Please order CBGs ACHS while on Lantus    May also consider adding Novolog correction if glucose remains elevated.

## 2011-04-04 LAB — CULTURE, ROUTINE-ABSCESS

## 2011-04-05 LAB — GLUCOSE, CAPILLARY

## 2011-04-06 LAB — GLUCOSE, CAPILLARY
Glucose-Capillary: 173 mg/dL — ABNORMAL HIGH (ref 70–99)
Glucose-Capillary: 125 mg/dL — ABNORMAL HIGH (ref 70–99)

## 2011-04-06 LAB — MRSA CULTURE

## 2011-04-08 LAB — GLUCOSE, CAPILLARY
Glucose-Capillary: 81 mg/dL (ref 70–99)
Glucose-Capillary: 89 mg/dL (ref 70–99)

## 2011-04-09 LAB — GLUCOSE, CAPILLARY: Glucose-Capillary: 176 mg/dL — ABNORMAL HIGH (ref 70–99)

## 2011-04-11 LAB — GLUCOSE, CAPILLARY
Glucose-Capillary: 117 mg/dL — ABNORMAL HIGH (ref 70–99)
Glucose-Capillary: 119 mg/dL — ABNORMAL HIGH (ref 70–99)

## 2011-04-12 LAB — GLUCOSE, CAPILLARY
Glucose-Capillary: 73 mg/dL (ref 70–99)
Glucose-Capillary: 86 mg/dL (ref 70–99)

## 2011-04-13 LAB — GLUCOSE, CAPILLARY: Glucose-Capillary: 107 mg/dL — ABNORMAL HIGH (ref 70–99)

## 2011-04-14 LAB — GLUCOSE, CAPILLARY
Glucose-Capillary: 89 mg/dL (ref 70–99)
Glucose-Capillary: 93 mg/dL (ref 70–99)

## 2011-04-17 LAB — GLUCOSE, CAPILLARY: Glucose-Capillary: 104 mg/dL — ABNORMAL HIGH (ref 70–99)

## 2011-04-18 LAB — GLUCOSE, CAPILLARY
Glucose-Capillary: 97 mg/dL (ref 70–99)
Glucose-Capillary: 201 mg/dL — ABNORMAL HIGH (ref 70–99)

## 2011-04-19 LAB — GLUCOSE, CAPILLARY: Glucose-Capillary: 148 mg/dL — ABNORMAL HIGH (ref 70–99)

## 2011-04-20 LAB — GLUCOSE, CAPILLARY: Glucose-Capillary: 104 mg/dL — ABNORMAL HIGH (ref 70–99)

## 2011-04-21 LAB — GLUCOSE, CAPILLARY
Glucose-Capillary: 88 mg/dL (ref 70–99)
Glucose-Capillary: 126 mg/dL — ABNORMAL HIGH (ref 70–99)

## 2011-04-23 LAB — GLUCOSE, CAPILLARY
Glucose-Capillary: 93 mg/dL (ref 70–99)
Glucose-Capillary: 126 mg/dL — ABNORMAL HIGH (ref 70–99)

## 2011-04-24 LAB — GLUCOSE, CAPILLARY
Glucose-Capillary: 87 mg/dL (ref 70–99)
Glucose-Capillary: 189 mg/dL — ABNORMAL HIGH (ref 70–99)

## 2011-04-24 NOTE — ED Provider Notes (Signed)
Medical screening examination/treatment/procedure(s) were performed by non-physician practitioner and as supervising physician I was immediately available for consultation/collaboration.  Charles B. Bernette Mayers, MD 04/24/11 1207

## 2011-04-25 LAB — GLUCOSE, CAPILLARY: Glucose-Capillary: 88 mg/dL (ref 70–99)

## 2011-04-28 LAB — GLUCOSE, CAPILLARY: Glucose-Capillary: 144 mg/dL — ABNORMAL HIGH (ref 70–99)

## 2011-04-29 LAB — GLUCOSE, CAPILLARY
Glucose-Capillary: 102 mg/dL — ABNORMAL HIGH (ref 70–99)
Glucose-Capillary: 135 mg/dL — ABNORMAL HIGH (ref 70–99)

## 2011-04-30 LAB — GLUCOSE, CAPILLARY: Glucose-Capillary: 104 mg/dL — ABNORMAL HIGH (ref 70–99)

## 2011-05-02 LAB — GLUCOSE, CAPILLARY: Glucose-Capillary: 160 mg/dL — ABNORMAL HIGH (ref 70–99)

## 2011-05-03 LAB — GLUCOSE, CAPILLARY: Glucose-Capillary: 111 mg/dL — ABNORMAL HIGH (ref 70–99)

## 2011-05-04 LAB — GLUCOSE, CAPILLARY
Glucose-Capillary: 87 mg/dL (ref 70–99)
Glucose-Capillary: 100 mg/dL — ABNORMAL HIGH (ref 70–99)

## 2011-05-05 LAB — GLUCOSE, CAPILLARY: Glucose-Capillary: 93 mg/dL (ref 70–99)

## 2011-05-06 LAB — GLUCOSE, CAPILLARY: Glucose-Capillary: 78 mg/dL (ref 70–99)

## 2011-05-07 LAB — GLUCOSE, CAPILLARY: Glucose-Capillary: 80 mg/dL (ref 70–99)

## 2011-05-08 ENCOUNTER — Encounter (HOSPITAL_COMMUNITY): Payer: Medicare Other

## 2011-05-08 ENCOUNTER — Non-Acute Institutional Stay: Payer: Self-pay

## 2011-05-08 LAB — GLUCOSE, CAPILLARY
Glucose-Capillary: 72 mg/dL (ref 70–99)
Glucose-Capillary: 122 mg/dL — ABNORMAL HIGH (ref 70–99)

## 2011-05-08 NOTE — Patient Instructions (Addendum)
20 LAVAEH BAU  05/08/2011   Your procedure is scheduled on:  05/14/2011  Report to [facility] at  830  AM.  Call this number if you have problems the morning of surgery: [phone]   Remember:   Do not eat food:After Midnight.  Do not drink clear liquids: After Midnight.  Take these medicines the morning of surgery with A SIP OF WATER: Norvasc,aricept,hctz,lexapro   Do not wear jewelry, make-up or nail polish.  Do not wear lotions, powders, or perfumes. You may wear deodorant.  Do not shave 48 hours prior to surgery.  Do not bring valuables to the hospital.  Contacts, dentures or bridgework may not be worn into surgery.  Leave suitcase in the car. After surgery it may be brought to your room.  For patients admitted to the hospital, checkout time is 11:00 AM the day of discharge.   Patients discharged the day of surgery will not be allowed to drive home.  Name and phone number of your driver: n/a  Special Instructions: N/A   Please read over the following fact sheets that you were given: Anesthesia Post-op Instructions

## 2011-05-09 LAB — GLUCOSE, CAPILLARY: Glucose-Capillary: 83 mg/dL (ref 70–99)

## 2011-05-10 LAB — GLUCOSE, CAPILLARY
Glucose-Capillary: 79 mg/dL (ref 70–99)
Glucose-Capillary: 110 mg/dL — ABNORMAL HIGH (ref 70–99)

## 2011-05-12 LAB — GLUCOSE, CAPILLARY
Glucose-Capillary: 104 mg/dL — ABNORMAL HIGH (ref 70–99)
Glucose-Capillary: 131 mg/dL — ABNORMAL HIGH (ref 70–99)

## 2011-05-13 LAB — GLUCOSE, CAPILLARY: Glucose-Capillary: 134 mg/dL — ABNORMAL HIGH (ref 70–99)

## 2011-05-14 ENCOUNTER — Ambulatory Visit (HOSPITAL_COMMUNITY)
Admission: RE | Admit: 2011-05-14 | Discharge: 2011-05-14 | Disposition: A | Discharge status: Home / Self Care | Payer: Medicare Other | Source: Ambulatory Visit | Attending: Ophthalmology | Admitting: Ophthalmology

## 2011-05-14 ENCOUNTER — Ambulatory Visit (HOSPITAL_COMMUNITY): Payer: Medicare Other | Admitting: Anesthesiology

## 2011-05-14 ENCOUNTER — Encounter (HOSPITAL_COMMUNITY): Payer: Self-pay | Admitting: Anesthesiology

## 2011-05-14 ENCOUNTER — Encounter (HOSPITAL_COMMUNITY)
Admission: RE | Disposition: A | Discharge status: Home / Self Care | Payer: Self-pay | Source: Ambulatory Visit | Attending: Ophthalmology

## 2011-05-14 ENCOUNTER — Other Ambulatory Visit: Payer: Self-pay

## 2011-05-14 ENCOUNTER — Inpatient Hospital Stay
Admission: RE | Admit: 2011-05-14 | Discharge: 2014-02-14 | Disposition: A | Discharge status: Nursing Home (Includes ICF) | Payer: Federal, State, Local not specified - PPO | Source: Ambulatory Visit | Attending: Internal Medicine | Admitting: Internal Medicine

## 2011-05-14 DIAGNOSIS — Z01810 Encounter for preprocedural cardiovascular examination: Secondary | ICD-10-CM | POA: Insufficient documentation

## 2011-05-14 DIAGNOSIS — I10 Essential (primary) hypertension: Secondary | ICD-10-CM | POA: Insufficient documentation

## 2011-05-14 DIAGNOSIS — E119 Type 2 diabetes mellitus without complications: Secondary | ICD-10-CM | POA: Insufficient documentation

## 2011-05-14 DIAGNOSIS — Z79899 Other long term (current) drug therapy: Secondary | ICD-10-CM | POA: Insufficient documentation

## 2011-05-14 DIAGNOSIS — Z794 Long term (current) use of insulin: Secondary | ICD-10-CM | POA: Insufficient documentation

## 2011-05-14 DIAGNOSIS — R0989 Other specified symptoms and signs involving the circulatory and respiratory systems: Principal | ICD-10-CM

## 2011-05-14 DIAGNOSIS — H04419 Chronic dacryocystitis of unspecified lacrimal passage: Secondary | ICD-10-CM | POA: Insufficient documentation

## 2011-05-14 DIAGNOSIS — Z7982 Long term (current) use of aspirin: Secondary | ICD-10-CM | POA: Insufficient documentation

## 2011-05-14 HISTORY — PX: LACRIMAL TUBE INSERTION: SHX1905

## 2011-05-14 LAB — GLUCOSE, CAPILLARY: Glucose-Capillary: 85 mg/dL (ref 70–99)

## 2011-05-14 SURGERY — INSERTION, LACRIMAL TUBE
Anesthesia: Monitor Anesthesia Care | Site: Eye | Laterality: Right | Wound class: Dirty or Infected

## 2011-05-14 MED ORDER — MIDAZOLAM HCL 5 MG/5ML IJ SOLN
INTRAMUSCULAR | Status: DC | PRN
  Administered 2011-05-14: 1 mg via INTRAVENOUS

## 2011-05-14 MED ORDER — MIDAZOLAM HCL 2 MG/2ML IJ SOLN
INTRAMUSCULAR | Status: AC
  Administered 2011-05-14: 2 mg via INTRAVENOUS
  Filled 2011-05-14: qty 2

## 2011-05-14 MED ORDER — FENTANYL CITRATE 0.05 MG/ML IJ SOLN
INTRAMUSCULAR | Status: DC | PRN
  Administered 2011-05-14: 35 ug via INTRAVENOUS

## 2011-05-14 MED ORDER — LIDOCAINE HCL (PF) 1 % IJ SOLN
INTRAMUSCULAR | Status: DC | PRN
  Administered 2011-05-14: .5 mL

## 2011-05-14 MED ORDER — PROPOFOL 10 MG/ML IV EMUL
INTRAVENOUS | Status: AC
  Filled 2011-05-14: qty 20

## 2011-05-14 MED ORDER — MIDAZOLAM HCL 2 MG/2ML IJ SOLN
INTRAMUSCULAR | Status: AC
  Filled 2011-05-14: qty 2

## 2011-05-14 MED ORDER — PROPOFOL 10 MG/ML IV EMUL
INTRAVENOUS | Status: DC | PRN
  Administered 2011-05-14: 25 ug/kg/min via INTRAVENOUS

## 2011-05-14 MED ORDER — MIDAZOLAM HCL 2 MG/2ML IJ SOLN
1.0000 mg | INTRAMUSCULAR | Status: DC | PRN
  Administered 2011-05-14: 2 mg via INTRAVENOUS

## 2011-05-14 MED ORDER — LACTATED RINGERS IV SOLN
INTRAVENOUS | Status: DC | PRN
  Administered 2011-05-14: 11:00:00 via INTRAVENOUS

## 2011-05-14 MED ORDER — FENTANYL CITRATE 0.05 MG/ML IJ SOLN
INTRAMUSCULAR | Status: AC
  Filled 2011-05-14: qty 2

## 2011-05-14 MED ORDER — LACTATED RINGERS IV SOLN
INTRAVENOUS | Status: DC
  Administered 2011-05-14: 11:00:00 via INTRAVENOUS

## 2011-05-14 SURGICAL SUPPLY — 5 items
CLOTH BEACON ORANGE TIMEOUT ST (SAFETY) ×2 IMPLANT
GLOVE EXAM NITRILE MD LF STRL (GLOVE) ×2 IMPLANT
GLOVE SKINSENSE NS SZ6.5 (GLOVE) ×1
GLOVE SKINSENSE STRL SZ6.5 (GLOVE) ×1 IMPLANT
WATER STERILE IRR 250ML POUR (IV SOLUTION) ×2 IMPLANT

## 2011-05-14 NOTE — Transfer of Care (Signed)
Immediate Anesthesia Transfer of Care Note  Patient: Nancy [name]  Procedure(s) Performed:  LACRIMAL TUBE INSERTION - Placement of Stent Right Lower Canaliculus  Patient Location: PACU and Short Stay  Anesthesia Type: MAC  Level of Consciousness: awake, alert  and oriented  Airway & Oxygen Therapy: Patient Spontanous Breathing and Patient connected to nasal cannula oxygen  Post-op Assessment: Report given to PACU RN  Post vital signs: stable  Complications: No apparent anesthesia complications

## 2011-05-14 NOTE — Anesthesia Preprocedure Evaluation (Addendum)
Anesthesia Evaluation  Name, MR# and DOB Patient confused  General Assessment Comment  Reviewed: Allergy & Precautions, H&P , NPO status , Patient's Chart, lab work & pertinent test results  History of Anesthesia Complications Negative for: history of anesthetic complications  Airway Mallampati: III  Neck ROM: Full    Dental  (+) Edentulous Upper and Poor Dentition   Pulmonary    pulmonary exam normalPulmonary Exam Normal     Cardiovascular hypertension, Pt. on medications + CAD + dysrhythmias (hx of syncope) Regular Normal    Neuro/Psych    (+) Depression,  Dementia  GI/Hepatic/Renal        GERD Medicated and Controlled     Endo/Other  (+) Diabetes mellitus-, Well Controlled, Type 2,Hypothyroidism,      Abdominal   Musculoskeletal   Hematology   Peds  Reproductive/Obstetrics    Anesthesia Other Findings             Anesthesia Physical Anesthesia Plan  ASA: III  Anesthesia Plan: MAC   Post-op Pain Management:    Induction: Intravenous  Airway Management Planned: Nasal Cannula  Additional Equipment:   Intra-op Plan:   Post-operative Plan:   Informed Consent: I have reviewed the patients History and Physical, chart, labs and discussed the procedure including the risks, benefits and alternatives for the proposed anesthesia with the patient or authorized representative who has indicated his/her understanding and acceptance.     Plan Discussed with:   Anesthesia Plan Comments: (Anesthesia ready at 1050  Mabeline Caras, CRNA)       Anesthesia Quick Evaluation

## 2011-05-14 NOTE — Op Note (Signed)
05/14/2011  12:01 PM  PATIENT:  Nancy [name]  77 y.o. female  PRE-OPERATIVE DIAGNOSIS:  dacryocystitis right eye  POST-OPERATIVE DIAGNOSIS:  dacryocystitis right eye  PROCEDURE:  Procedure(s): LACRIMAL TUBE INSERTION  SURGEON:  Surgeon(s): Susa Simmonds  ASSISTANTS:   ANESTHESIA STAFF: Laurene Footman - Anesthesiologist Glynn Octave - CRNA  ANESTHESIA:   local  REQUESTED LENS POWER: none  LENS IMPLANT INFORMATION: @ORIMPLANT @  CUMULATIVE DISSIPATED ENERGY:none  INDICATIONS:recurrent dacryocycsitis OD  OP FINDINGS:normal puncta, no ectropion/entropion, nld obstruction OD  COMPLICATIONS:None  DICTATION #: see scanned op note  PLAN OF CARE: antibiotic ointment applied  PATIENT DISPOSITION:  Short Stay

## 2011-05-14 NOTE — Anesthesia Postprocedure Evaluation (Signed)
  Anesthesia Post-op Note  Patient: Nancy [name]  Procedure(s) Performed:  LACRIMAL TUBE INSERTION - Placement of Stent Right Lower Canaliculus  Patient Location: PACU and Short Stay  Anesthesia Type: MAC  Level of Consciousness: awake, alert  and oriented  Airway and Oxygen Therapy: Patient Spontanous Breathing  Post-op Pain: none  Post-op Assessment: Post-op Vital signs reviewed, Patient's Cardiovascular Status Stable and Respiratory Function Stable  Post-op Vital Signs: stable  Complications: No apparent anesthesia complications

## 2011-05-14 NOTE — H&P (Signed)
Pt interviewed and examined with no significant changes.  Operative site was marked

## 2011-05-15 LAB — GLUCOSE, CAPILLARY: Glucose-Capillary: 127 mg/dL — ABNORMAL HIGH (ref 70–99)

## 2011-05-17 LAB — GLUCOSE, CAPILLARY
Glucose-Capillary: 85 mg/dL (ref 70–99)
Glucose-Capillary: 137 mg/dL — ABNORMAL HIGH (ref 70–99)

## 2011-05-18 LAB — GLUCOSE, CAPILLARY: Glucose-Capillary: 98 mg/dL (ref 70–99)

## 2011-05-19 LAB — GLUCOSE, CAPILLARY
Glucose-Capillary: 76 mg/dL (ref 70–99)
Glucose-Capillary: 80 mg/dL (ref 70–99)

## 2011-05-20 ENCOUNTER — Encounter (HOSPITAL_COMMUNITY): Payer: Self-pay | Admitting: Ophthalmology

## 2011-05-20 LAB — GLUCOSE, CAPILLARY: Glucose-Capillary: 91 mg/dL (ref 70–99)

## 2011-05-21 LAB — GLUCOSE, CAPILLARY: Glucose-Capillary: 112 mg/dL — ABNORMAL HIGH (ref 70–99)

## 2011-05-22 LAB — GLUCOSE, CAPILLARY: Glucose-Capillary: 114 mg/dL — ABNORMAL HIGH (ref 70–99)

## 2011-05-24 LAB — GLUCOSE, CAPILLARY: Glucose-Capillary: 77 mg/dL (ref 70–99)

## 2011-05-25 LAB — GLUCOSE, CAPILLARY: Glucose-Capillary: 93 mg/dL (ref 70–99)

## 2011-05-26 LAB — GLUCOSE, CAPILLARY
Glucose-Capillary: 90 mg/dL (ref 70–99)
Glucose-Capillary: 87 mg/dL (ref 70–99)

## 2011-05-27 LAB — GLUCOSE, CAPILLARY
Glucose-Capillary: 82 mg/dL (ref 70–99)
Glucose-Capillary: 89 mg/dL (ref 70–99)

## 2011-05-28 LAB — GLUCOSE, CAPILLARY: Glucose-Capillary: 107 mg/dL — ABNORMAL HIGH (ref 70–99)

## 2011-05-30 LAB — GLUCOSE, CAPILLARY: Glucose-Capillary: 144 mg/dL — ABNORMAL HIGH (ref 70–99)

## 2011-05-31 LAB — GLUCOSE, CAPILLARY
Glucose-Capillary: 142 mg/dL — ABNORMAL HIGH (ref 70–99)
Glucose-Capillary: 187 mg/dL — ABNORMAL HIGH (ref 70–99)

## 2011-06-01 LAB — GLUCOSE, CAPILLARY
Glucose-Capillary: 110 mg/dL — ABNORMAL HIGH (ref 70–99)
Glucose-Capillary: 196 mg/dL — ABNORMAL HIGH (ref 70–99)

## 2011-06-04 LAB — GLUCOSE, CAPILLARY
Glucose-Capillary: 105 mg/dL — ABNORMAL HIGH (ref 70–99)
Glucose-Capillary: 160 mg/dL — ABNORMAL HIGH (ref 70–99)

## 2011-06-05 LAB — GLUCOSE, CAPILLARY: Glucose-Capillary: 117 mg/dL — ABNORMAL HIGH (ref 70–99)

## 2011-06-06 LAB — GLUCOSE, CAPILLARY: Glucose-Capillary: 96 mg/dL (ref 70–99)

## 2011-06-07 LAB — GLUCOSE, CAPILLARY: Glucose-Capillary: 104 mg/dL — ABNORMAL HIGH (ref 70–99)

## 2011-06-08 LAB — GLUCOSE, CAPILLARY: Glucose-Capillary: 137 mg/dL — ABNORMAL HIGH (ref 70–99)

## 2011-06-09 LAB — GLUCOSE, CAPILLARY: Glucose-Capillary: 110 mg/dL — ABNORMAL HIGH (ref 70–99)

## 2011-06-10 LAB — GLUCOSE, CAPILLARY
Glucose-Capillary: 81 mg/dL (ref 70–99)
Glucose-Capillary: 103 mg/dL — ABNORMAL HIGH (ref 70–99)
Glucose-Capillary: 104 mg/dL — ABNORMAL HIGH (ref 70–99)
Glucose-Capillary: 108 mg/dL — ABNORMAL HIGH (ref 70–99)
Glucose-Capillary: 109 mg/dL — ABNORMAL HIGH (ref 70–99)
Glucose-Capillary: 113 mg/dL — ABNORMAL HIGH (ref 70–99)
Glucose-Capillary: 121 mg/dL — ABNORMAL HIGH (ref 70–99)
Glucose-Capillary: 140 mg/dL — ABNORMAL HIGH (ref 70–99)
Glucose-Capillary: 158 mg/dL — ABNORMAL HIGH (ref 70–99)
Glucose-Capillary: 160 mg/dL — ABNORMAL HIGH (ref 70–99)
Glucose-Capillary: 188 mg/dL — ABNORMAL HIGH (ref 70–99)
Glucose-Capillary: 87 mg/dL (ref 70–99)
Glucose-Capillary: 93 mg/dL (ref 70–99)
Glucose-Capillary: 94 mg/dL (ref 70–99)
Glucose-Capillary: 94 mg/dL (ref 70–99)
Glucose-Capillary: 99 mg/dL (ref 70–99)
Glucose-Capillary: 93 mg/dL (ref 70–99)

## 2011-06-11 LAB — GLUCOSE, CAPILLARY
Glucose-Capillary: 91 mg/dL (ref 70–99)
Glucose-Capillary: 101 mg/dL — ABNORMAL HIGH (ref 70–99)
Glucose-Capillary: 103 mg/dL — ABNORMAL HIGH (ref 70–99)
Glucose-Capillary: 103 mg/dL — ABNORMAL HIGH (ref 70–99)
Glucose-Capillary: 103 mg/dL — ABNORMAL HIGH (ref 70–99)
Glucose-Capillary: 106 mg/dL — ABNORMAL HIGH (ref 70–99)
Glucose-Capillary: 106 mg/dL — ABNORMAL HIGH (ref 70–99)
Glucose-Capillary: 108 mg/dL — ABNORMAL HIGH (ref 70–99)
Glucose-Capillary: 109 mg/dL — ABNORMAL HIGH (ref 70–99)
Glucose-Capillary: 110 mg/dL — ABNORMAL HIGH (ref 70–99)
Glucose-Capillary: 116 mg/dL — ABNORMAL HIGH (ref 70–99)
Glucose-Capillary: 116 mg/dL — ABNORMAL HIGH (ref 70–99)
Glucose-Capillary: 118 mg/dL — ABNORMAL HIGH (ref 70–99)
Glucose-Capillary: 122 mg/dL — ABNORMAL HIGH (ref 70–99)
Glucose-Capillary: 123 mg/dL — ABNORMAL HIGH (ref 70–99)
Glucose-Capillary: 127 mg/dL — ABNORMAL HIGH (ref 70–99)
Glucose-Capillary: 127 mg/dL — ABNORMAL HIGH (ref 70–99)
Glucose-Capillary: 127 mg/dL — ABNORMAL HIGH (ref 70–99)
Glucose-Capillary: 128 mg/dL — ABNORMAL HIGH (ref 70–99)
Glucose-Capillary: 130 mg/dL — ABNORMAL HIGH (ref 70–99)
Glucose-Capillary: 132 mg/dL — ABNORMAL HIGH (ref 70–99)
Glucose-Capillary: 135 mg/dL — ABNORMAL HIGH (ref 70–99)
Glucose-Capillary: 162 mg/dL — ABNORMAL HIGH (ref 70–99)
Glucose-Capillary: 165 mg/dL — ABNORMAL HIGH (ref 70–99)
Glucose-Capillary: 178 mg/dL — ABNORMAL HIGH (ref 70–99)
Glucose-Capillary: 185 mg/dL — ABNORMAL HIGH (ref 70–99)
Glucose-Capillary: 198 mg/dL — ABNORMAL HIGH (ref 70–99)
Glucose-Capillary: 207 mg/dL — ABNORMAL HIGH (ref 70–99)
Glucose-Capillary: 91 mg/dL (ref 70–99)
Glucose-Capillary: 94 mg/dL (ref 70–99)
Glucose-Capillary: 94 mg/dL (ref 70–99)
Glucose-Capillary: 96 mg/dL (ref 70–99)
Glucose-Capillary: 99 mg/dL (ref 70–99)
Glucose-Capillary: 100 mg/dL — ABNORMAL HIGH (ref 70–99)
Glucose-Capillary: 107 mg/dL — ABNORMAL HIGH (ref 70–99)
Glucose-Capillary: 107 mg/dL — ABNORMAL HIGH (ref 70–99)
Glucose-Capillary: 112 mg/dL — ABNORMAL HIGH (ref 70–99)
Glucose-Capillary: 116 mg/dL — ABNORMAL HIGH (ref 70–99)
Glucose-Capillary: 124 mg/dL — ABNORMAL HIGH (ref 70–99)
Glucose-Capillary: 125 mg/dL — ABNORMAL HIGH (ref 70–99)
Glucose-Capillary: 144 mg/dL — ABNORMAL HIGH (ref 70–99)
Glucose-Capillary: 146 mg/dL — ABNORMAL HIGH (ref 70–99)
Glucose-Capillary: 160 mg/dL — ABNORMAL HIGH (ref 70–99)
Glucose-Capillary: 193 mg/dL — ABNORMAL HIGH (ref 70–99)
Glucose-Capillary: 216 mg/dL — ABNORMAL HIGH (ref 70–99)
Glucose-Capillary: 89 mg/dL (ref 70–99)
Glucose-Capillary: 92 mg/dL (ref 70–99)
Glucose-Capillary: 99 mg/dL (ref 70–99)
Glucose-Capillary: 101 mg/dL — ABNORMAL HIGH (ref 70–99)
Glucose-Capillary: 106 mg/dL — ABNORMAL HIGH (ref 70–99)
Glucose-Capillary: 109 mg/dL — ABNORMAL HIGH (ref 70–99)
Glucose-Capillary: 110 mg/dL — ABNORMAL HIGH (ref 70–99)
Glucose-Capillary: 111 mg/dL — ABNORMAL HIGH (ref 70–99)
Glucose-Capillary: 121 mg/dL — ABNORMAL HIGH (ref 70–99)
Glucose-Capillary: 124 mg/dL — ABNORMAL HIGH (ref 70–99)
Glucose-Capillary: 140 mg/dL — ABNORMAL HIGH (ref 70–99)
Glucose-Capillary: 144 mg/dL — ABNORMAL HIGH (ref 70–99)
Glucose-Capillary: 149 mg/dL — ABNORMAL HIGH (ref 70–99)
Glucose-Capillary: 182 mg/dL — ABNORMAL HIGH (ref 70–99)
Glucose-Capillary: 199 mg/dL — ABNORMAL HIGH (ref 70–99)
Glucose-Capillary: 90 mg/dL (ref 70–99)
Glucose-Capillary: 95 mg/dL (ref 70–99)
Glucose-Capillary: 97 mg/dL (ref 70–99)

## 2011-06-11 LAB — URINALYSIS, ROUTINE W REFLEX MICROSCOPIC
Bilirubin Urine: NEGATIVE
Glucose, UA: NEGATIVE
Ketones, ur: NEGATIVE
Nitrite: NEGATIVE
pH: 5

## 2011-06-11 LAB — BASIC METABOLIC PANEL
GFR calc non Af Amer: 42 — ABNORMAL LOW
Potassium: 4
Sodium: 136

## 2011-06-11 LAB — DIFFERENTIAL
Eosinophils Relative: 1
Lymphocytes Relative: 25
Lymphs Abs: 2
Monocytes Absolute: 0.4

## 2011-06-11 LAB — URINE MICROSCOPIC-ADD ON

## 2011-06-11 LAB — CBC
HCT: 35 — ABNORMAL LOW
Hemoglobin: 11.5 — ABNORMAL LOW
WBC: 7.8

## 2011-06-12 LAB — GLUCOSE, CAPILLARY: Glucose-Capillary: 93 mg/dL (ref 70–99)

## 2011-06-13 LAB — GLUCOSE, CAPILLARY
Glucose-Capillary: 94 mg/dL (ref 70–99)
Glucose-Capillary: 100 mg/dL — ABNORMAL HIGH (ref 70–99)
Glucose-Capillary: 104 mg/dL — ABNORMAL HIGH (ref 70–99)
Glucose-Capillary: 104 mg/dL — ABNORMAL HIGH (ref 70–99)
Glucose-Capillary: 107 mg/dL — ABNORMAL HIGH (ref 70–99)
Glucose-Capillary: 109 mg/dL — ABNORMAL HIGH (ref 70–99)
Glucose-Capillary: 112 mg/dL — ABNORMAL HIGH (ref 70–99)
Glucose-Capillary: 112 mg/dL — ABNORMAL HIGH (ref 70–99)
Glucose-Capillary: 114 mg/dL — ABNORMAL HIGH (ref 70–99)
Glucose-Capillary: 117 mg/dL — ABNORMAL HIGH (ref 70–99)
Glucose-Capillary: 121 mg/dL — ABNORMAL HIGH (ref 70–99)
Glucose-Capillary: 126 mg/dL — ABNORMAL HIGH (ref 70–99)
Glucose-Capillary: 127 mg/dL — ABNORMAL HIGH (ref 70–99)
Glucose-Capillary: 128 mg/dL — ABNORMAL HIGH (ref 70–99)
Glucose-Capillary: 142 mg/dL — ABNORMAL HIGH (ref 70–99)
Glucose-Capillary: 146 mg/dL — ABNORMAL HIGH (ref 70–99)
Glucose-Capillary: 148 mg/dL — ABNORMAL HIGH (ref 70–99)
Glucose-Capillary: 149 mg/dL — ABNORMAL HIGH (ref 70–99)
Glucose-Capillary: 151 mg/dL — ABNORMAL HIGH (ref 70–99)
Glucose-Capillary: 154 mg/dL — ABNORMAL HIGH (ref 70–99)
Glucose-Capillary: 162 mg/dL — ABNORMAL HIGH (ref 70–99)
Glucose-Capillary: 164 mg/dL — ABNORMAL HIGH (ref 70–99)
Glucose-Capillary: 169 mg/dL — ABNORMAL HIGH (ref 70–99)
Glucose-Capillary: 174 mg/dL — ABNORMAL HIGH (ref 70–99)
Glucose-Capillary: 174 mg/dL — ABNORMAL HIGH (ref 70–99)
Glucose-Capillary: 180 mg/dL — ABNORMAL HIGH (ref 70–99)
Glucose-Capillary: 186 mg/dL — ABNORMAL HIGH (ref 70–99)
Glucose-Capillary: 215 mg/dL — ABNORMAL HIGH (ref 70–99)
Glucose-Capillary: 82 mg/dL (ref 70–99)
Glucose-Capillary: 99 mg/dL (ref 70–99)

## 2011-06-14 LAB — GLUCOSE, CAPILLARY
Glucose-Capillary: 106 mg/dL — ABNORMAL HIGH (ref 70–99)
Glucose-Capillary: 183 mg/dL — ABNORMAL HIGH (ref 70–99)

## 2011-06-15 LAB — GLUCOSE, CAPILLARY: Glucose-Capillary: 149 mg/dL — ABNORMAL HIGH (ref 70–99)

## 2011-06-16 LAB — GLUCOSE, CAPILLARY: Glucose-Capillary: 113 mg/dL — ABNORMAL HIGH (ref 70–99)

## 2011-06-18 LAB — GLUCOSE, CAPILLARY

## 2011-06-19 LAB — GLUCOSE, CAPILLARY: Glucose-Capillary: 94 mg/dL (ref 70–99)

## 2011-06-20 LAB — GLUCOSE, CAPILLARY
Glucose-Capillary: 87 mg/dL (ref 70–99)
Glucose-Capillary: 125 mg/dL — ABNORMAL HIGH (ref 70–99)

## 2011-06-22 LAB — GLUCOSE, CAPILLARY: Glucose-Capillary: 87 mg/dL (ref 70–99)

## 2011-06-23 LAB — GLUCOSE, CAPILLARY: Glucose-Capillary: 105 mg/dL — ABNORMAL HIGH (ref 70–99)

## 2011-06-24 LAB — BASIC METABOLIC PANEL
BUN: 12
BUN: 18
BUN: 5 — ABNORMAL LOW
CO2: 23
CO2: 25
Calcium: 8.7
Chloride: 107
Chloride: 109
Chloride: 111
Chloride: 114 — ABNORMAL HIGH
Creatinine, Ser: 0.84
Creatinine, Ser: 0.85
GFR calc non Af Amer: 48 — ABNORMAL LOW
GFR calc non Af Amer: >60
Glucose, Bld: 131 — ABNORMAL HIGH
Glucose, Bld: 132 — ABNORMAL HIGH
Glucose, Bld: 97
Potassium: 3.9
Potassium: 4.3
Potassium: 4.5
Sodium: 139
Sodium: 141

## 2011-06-24 LAB — DIFFERENTIAL
Basophils Absolute: 0
Basophils Relative: 1
Eosinophils Absolute: 0.1
Eosinophils Absolute: 0.1
Eosinophils Relative: 3
Eosinophils Relative: 3
Lymphocytes Relative: 43
Lymphocytes Relative: 45
Lymphs Abs: 1.9
Lymphs Abs: 2.2
Monocytes Absolute: 0.3
Monocytes Absolute: 0.4
Monocytes Absolute: 0.4
Monocytes Relative: 4
Monocytes Relative: 8
Monocytes Relative: 9
Neutro Abs: 5.1
Neutrophils Relative %: 70

## 2011-06-24 LAB — CBC
HCT: 29.3 — ABNORMAL LOW
HCT: 33.1 — ABNORMAL LOW
Hemoglobin: 10.9 — ABNORMAL LOW
Hemoglobin: 9.7 — ABNORMAL LOW
MCHC: 32.8
MCV: 83.5
MCV: 83.6
RBC: 3.47 — ABNORMAL LOW
RBC: 3.96
RDW: 14.6 — ABNORMAL HIGH
RDW: 14.8 — ABNORMAL HIGH
WBC: 4.4
WBC: 4.8

## 2011-06-24 LAB — URINE CULTURE
Colony Count: 60000
Special Requests: NEGATIVE

## 2011-06-24 LAB — CARDIAC PANEL(CRET KIN+CKTOT+MB+TROPI)
CK, MB: 1.3
CK, MB: 1.4
Relative Index: INVALID
Relative Index: INVALID
Total CK: 34
Total CK: 40
Troponin I: 0.02
Troponin I: 0.03

## 2011-06-24 LAB — URINALYSIS, ROUTINE W REFLEX MICROSCOPIC
Hgb urine dipstick: NEGATIVE
Ketones, ur: NEGATIVE
Protein, ur: NEGATIVE
Urobilinogen, UA: 0.2

## 2011-06-24 LAB — POCT CARDIAC MARKERS
CKMB, poc: 1.3
Troponin i, poc: <0.05

## 2011-06-24 LAB — GLUCOSE, CAPILLARY: Glucose-Capillary: 76 mg/dL (ref 70–99)

## 2011-06-24 LAB — T4: T4, Total: 9.7

## 2011-06-24 LAB — B-NATRIURETIC PEPTIDE (CONVERTED LAB): Pro B Natriuretic peptide (BNP): 82.7

## 2011-06-24 LAB — D-DIMER, QUANTITATIVE: D-Dimer, Quant: 1.39 — ABNORMAL HIGH

## 2011-06-25 LAB — GLUCOSE, CAPILLARY: Glucose-Capillary: 197 mg/dL — ABNORMAL HIGH (ref 70–99)

## 2011-06-26 LAB — GLUCOSE, CAPILLARY: Glucose-Capillary: 118 mg/dL — ABNORMAL HIGH (ref 70–99)

## 2011-06-27 LAB — GLUCOSE, CAPILLARY: Glucose-Capillary: 88 mg/dL (ref 70–99)

## 2011-06-28 LAB — GLUCOSE, CAPILLARY: Glucose-Capillary: 203 mg/dL — ABNORMAL HIGH (ref 70–99)

## 2011-06-29 LAB — GLUCOSE, CAPILLARY: Glucose-Capillary: 125 mg/dL — ABNORMAL HIGH (ref 70–99)

## 2011-06-30 LAB — GLUCOSE, CAPILLARY
Glucose-Capillary: 121 mg/dL — ABNORMAL HIGH (ref 70–99)
Glucose-Capillary: 129 mg/dL — ABNORMAL HIGH (ref 70–99)

## 2011-07-01 LAB — GLUCOSE, CAPILLARY: Glucose-Capillary: 153 mg/dL — ABNORMAL HIGH (ref 70–99)

## 2011-07-02 LAB — GLUCOSE, CAPILLARY: Glucose-Capillary: 113 mg/dL — ABNORMAL HIGH (ref 70–99)

## 2011-07-03 LAB — GLUCOSE, CAPILLARY
Glucose-Capillary: 133 mg/dL — ABNORMAL HIGH (ref 70–99)
Glucose-Capillary: 114 mg/dL — ABNORMAL HIGH (ref 70–99)

## 2011-07-04 LAB — GLUCOSE, CAPILLARY: Glucose-Capillary: 198 mg/dL — ABNORMAL HIGH (ref 70–99)

## 2011-07-05 LAB — GLUCOSE, CAPILLARY: Glucose-Capillary: 121 mg/dL — ABNORMAL HIGH (ref 70–99)

## 2011-07-08 LAB — GLUCOSE, CAPILLARY
Glucose-Capillary: 104 mg/dL — ABNORMAL HIGH (ref 70–99)
Glucose-Capillary: 105 mg/dL — ABNORMAL HIGH (ref 70–99)

## 2011-07-09 LAB — GLUCOSE, CAPILLARY
Glucose-Capillary: 98 mg/dL (ref 70–99)
Glucose-Capillary: 157 mg/dL — ABNORMAL HIGH (ref 70–99)

## 2011-07-12 LAB — GLUCOSE, CAPILLARY
Glucose-Capillary: 108 mg/dL — ABNORMAL HIGH (ref 70–99)
Glucose-Capillary: 118 mg/dL — ABNORMAL HIGH (ref 70–99)

## 2011-07-13 LAB — GLUCOSE, CAPILLARY: Glucose-Capillary: 104 mg/dL — ABNORMAL HIGH (ref 70–99)

## 2011-07-14 LAB — GLUCOSE, CAPILLARY: Glucose-Capillary: 90 mg/dL (ref 70–99)

## 2011-07-15 LAB — GLUCOSE, CAPILLARY: Glucose-Capillary: 91 mg/dL (ref 70–99)

## 2011-07-16 LAB — GLUCOSE, CAPILLARY: Glucose-Capillary: 117 mg/dL — ABNORMAL HIGH (ref 70–99)

## 2011-07-17 LAB — GLUCOSE, CAPILLARY
Glucose-Capillary: 108 mg/dL — ABNORMAL HIGH (ref 70–99)
Glucose-Capillary: 103 mg/dL — ABNORMAL HIGH (ref 70–99)

## 2011-07-18 LAB — GLUCOSE, CAPILLARY: Glucose-Capillary: 88 mg/dL (ref 70–99)

## 2011-07-21 LAB — GLUCOSE, CAPILLARY: Glucose-Capillary: 130 mg/dL — ABNORMAL HIGH (ref 70–99)

## 2011-07-23 LAB — GLUCOSE, CAPILLARY: Glucose-Capillary: 120 mg/dL — ABNORMAL HIGH (ref 70–99)

## 2011-07-24 LAB — GLUCOSE, CAPILLARY
Glucose-Capillary: 104 mg/dL — ABNORMAL HIGH (ref 70–99)
Glucose-Capillary: 159 mg/dL — ABNORMAL HIGH (ref 70–99)

## 2011-07-25 LAB — GLUCOSE, CAPILLARY: Glucose-Capillary: 122 mg/dL — ABNORMAL HIGH (ref 70–99)

## 2011-07-26 LAB — GLUCOSE, CAPILLARY: Glucose-Capillary: 88 mg/dL (ref 70–99)

## 2011-07-27 LAB — GLUCOSE, CAPILLARY: Glucose-Capillary: 175 mg/dL — ABNORMAL HIGH (ref 70–99)

## 2011-07-28 LAB — GLUCOSE, CAPILLARY: Glucose-Capillary: 130 mg/dL — ABNORMAL HIGH (ref 70–99)

## 2011-07-29 LAB — GLUCOSE, CAPILLARY: Glucose-Capillary: 100 mg/dL — ABNORMAL HIGH (ref 70–99)

## 2011-07-30 LAB — GLUCOSE, CAPILLARY: Glucose-Capillary: 96 mg/dL (ref 70–99)

## 2011-07-31 LAB — GLUCOSE, CAPILLARY: Glucose-Capillary: 129 mg/dL — ABNORMAL HIGH (ref 70–99)

## 2011-08-01 LAB — GLUCOSE, CAPILLARY: Glucose-Capillary: 94 mg/dL (ref 70–99)

## 2011-08-02 LAB — GLUCOSE, CAPILLARY: Glucose-Capillary: 99 mg/dL (ref 70–99)

## 2011-08-03 LAB — GLUCOSE, CAPILLARY: Glucose-Capillary: 94 mg/dL (ref 70–99)

## 2011-08-04 LAB — GLUCOSE, CAPILLARY
Glucose-Capillary: 88 mg/dL (ref 70–99)
Glucose-Capillary: 168 mg/dL — ABNORMAL HIGH (ref 70–99)

## 2011-08-05 LAB — GLUCOSE, CAPILLARY: Glucose-Capillary: 93 mg/dL (ref 70–99)

## 2011-08-06 LAB — GLUCOSE, CAPILLARY: Glucose-Capillary: 85 mg/dL (ref 70–99)

## 2011-08-07 LAB — GLUCOSE, CAPILLARY: Glucose-Capillary: 88 mg/dL (ref 70–99)

## 2011-08-08 LAB — GLUCOSE, CAPILLARY
Glucose-Capillary: 107 mg/dL — ABNORMAL HIGH (ref 70–99)
Glucose-Capillary: 138 mg/dL — ABNORMAL HIGH (ref 70–99)

## 2011-08-09 LAB — GLUCOSE, CAPILLARY: Glucose-Capillary: 112 mg/dL — ABNORMAL HIGH (ref 70–99)

## 2011-08-11 LAB — GLUCOSE, CAPILLARY: Glucose-Capillary: 152 mg/dL — ABNORMAL HIGH (ref 70–99)

## 2011-08-12 LAB — GLUCOSE, CAPILLARY
Glucose-Capillary: 93 mg/dL (ref 70–99)
Glucose-Capillary: 84 mg/dL (ref 70–99)
Glucose-Capillary: 103 mg/dL — ABNORMAL HIGH (ref 70–99)

## 2011-08-15 LAB — GLUCOSE, CAPILLARY: Glucose-Capillary: 98 mg/dL (ref 70–99)

## 2011-08-16 LAB — GLUCOSE, CAPILLARY: Glucose-Capillary: 81 mg/dL (ref 70–99)

## 2011-08-17 LAB — GLUCOSE, CAPILLARY
Glucose-Capillary: 142 mg/dL — ABNORMAL HIGH (ref 70–99)
Glucose-Capillary: 89 mg/dL (ref 70–99)

## 2011-08-20 LAB — GLUCOSE, CAPILLARY
Glucose-Capillary: 89 mg/dL (ref 70–99)
Glucose-Capillary: 169 mg/dL — ABNORMAL HIGH (ref 70–99)

## 2011-08-21 LAB — GLUCOSE, CAPILLARY

## 2011-08-22 LAB — GLUCOSE, CAPILLARY: Glucose-Capillary: 128 mg/dL — ABNORMAL HIGH (ref 70–99)

## 2011-08-23 LAB — GLUCOSE, CAPILLARY: Glucose-Capillary: 126 mg/dL — ABNORMAL HIGH (ref 70–99)

## 2011-08-24 LAB — GLUCOSE, CAPILLARY: Glucose-Capillary: 87 mg/dL (ref 70–99)

## 2011-08-25 LAB — GLUCOSE, CAPILLARY
Glucose-Capillary: 158 mg/dL — ABNORMAL HIGH (ref 70–99)
Glucose-Capillary: 105 mg/dL — ABNORMAL HIGH (ref 70–99)

## 2011-08-26 LAB — GLUCOSE, CAPILLARY: Glucose-Capillary: 134 mg/dL — ABNORMAL HIGH (ref 70–99)

## 2011-08-28 LAB — GLUCOSE, CAPILLARY
Glucose-Capillary: 113 mg/dL — ABNORMAL HIGH (ref 70–99)
Glucose-Capillary: 97 mg/dL (ref 70–99)
Glucose-Capillary: 108 mg/dL — ABNORMAL HIGH (ref 70–99)

## 2011-08-29 LAB — GLUCOSE, CAPILLARY: Glucose-Capillary: 115 mg/dL — ABNORMAL HIGH (ref 70–99)

## 2011-08-31 LAB — GLUCOSE, CAPILLARY
Glucose-Capillary: 97 mg/dL (ref 70–99)
Glucose-Capillary: 129 mg/dL — ABNORMAL HIGH (ref 70–99)

## 2011-09-01 LAB — GLUCOSE, CAPILLARY: Glucose-Capillary: 157 mg/dL — ABNORMAL HIGH (ref 70–99)

## 2011-09-02 LAB — GLUCOSE, CAPILLARY
Glucose-Capillary: 77 mg/dL (ref 70–99)
Glucose-Capillary: 105 mg/dL — ABNORMAL HIGH (ref 70–99)

## 2011-09-04 LAB — GLUCOSE, CAPILLARY
Glucose-Capillary: 131 mg/dL — ABNORMAL HIGH (ref 70–99)
Glucose-Capillary: 159 mg/dL — ABNORMAL HIGH (ref 70–99)

## 2011-09-05 LAB — GLUCOSE, CAPILLARY
Glucose-Capillary: 83 mg/dL (ref 70–99)
Glucose-Capillary: 146 mg/dL — ABNORMAL HIGH (ref 70–99)

## 2011-09-06 LAB — GLUCOSE, CAPILLARY
Glucose-Capillary: 121 mg/dL — ABNORMAL HIGH (ref 70–99)
Glucose-Capillary: 103 mg/dL — ABNORMAL HIGH (ref 70–99)

## 2011-09-09 LAB — GLUCOSE, CAPILLARY: Glucose-Capillary: 88 mg/dL (ref 70–99)

## 2011-09-11 LAB — GLUCOSE, CAPILLARY
Glucose-Capillary: 97 mg/dL (ref 70–99)
Glucose-Capillary: 106 mg/dL — ABNORMAL HIGH (ref 70–99)

## 2011-09-12 LAB — GLUCOSE, CAPILLARY
Glucose-Capillary: 87 mg/dL (ref 70–99)
Glucose-Capillary: 110 mg/dL — ABNORMAL HIGH (ref 70–99)

## 2011-09-15 LAB — GLUCOSE, CAPILLARY
Glucose-Capillary: 134 mg/dL — ABNORMAL HIGH (ref 70–99)
Glucose-Capillary: 91 mg/dL (ref 70–99)
Glucose-Capillary: 87 mg/dL (ref 70–99)

## 2011-09-16 LAB — GLUCOSE, CAPILLARY
Glucose-Capillary: 82 mg/dL (ref 70–99)
Glucose-Capillary: 123 mg/dL — ABNORMAL HIGH (ref 70–99)

## 2011-09-18 LAB — GLUCOSE, CAPILLARY: Glucose-Capillary: 100 mg/dL — ABNORMAL HIGH (ref 70–99)

## 2011-09-19 LAB — GLUCOSE, CAPILLARY: Glucose-Capillary: 128 mg/dL — ABNORMAL HIGH (ref 70–99)

## 2011-09-20 LAB — GLUCOSE, CAPILLARY
Glucose-Capillary: 89 mg/dL (ref 70–99)
Glucose-Capillary: 128 mg/dL — ABNORMAL HIGH (ref 70–99)

## 2011-09-21 LAB — GLUCOSE, CAPILLARY: Glucose-Capillary: 87 mg/dL (ref 70–99)

## 2011-09-23 LAB — GLUCOSE, CAPILLARY
Glucose-Capillary: 94 mg/dL (ref 70–99)
Glucose-Capillary: 73 mg/dL (ref 70–99)

## 2011-09-25 LAB — GLUCOSE, CAPILLARY

## 2011-09-26 LAB — GLUCOSE, CAPILLARY: Glucose-Capillary: 105 mg/dL — ABNORMAL HIGH (ref 70–99)

## 2011-09-27 LAB — GLUCOSE, CAPILLARY: Glucose-Capillary: 126 mg/dL — ABNORMAL HIGH (ref 70–99)

## 2011-09-29 LAB — GLUCOSE, CAPILLARY: Glucose-Capillary: 112 mg/dL — ABNORMAL HIGH (ref 70–99)

## 2011-10-01 LAB — GLUCOSE, CAPILLARY
Glucose-Capillary: 96 mg/dL (ref 70–99)
Glucose-Capillary: 160 mg/dL — ABNORMAL HIGH (ref 70–99)

## 2011-10-02 LAB — GLUCOSE, CAPILLARY: Glucose-Capillary: 103 mg/dL — ABNORMAL HIGH (ref 70–99)

## 2011-10-03 IMAGING — CR DG HIP W/ PELVIS BILAT
5 series · 5 of 5 positions shown · non-contrast
Comparison: None

CLINICAL DATA: Pain in both hips for 2 days

BILATERAL HIP WITH PELVIS - 4+ VIEW

[view not recorded (1 of 5)]
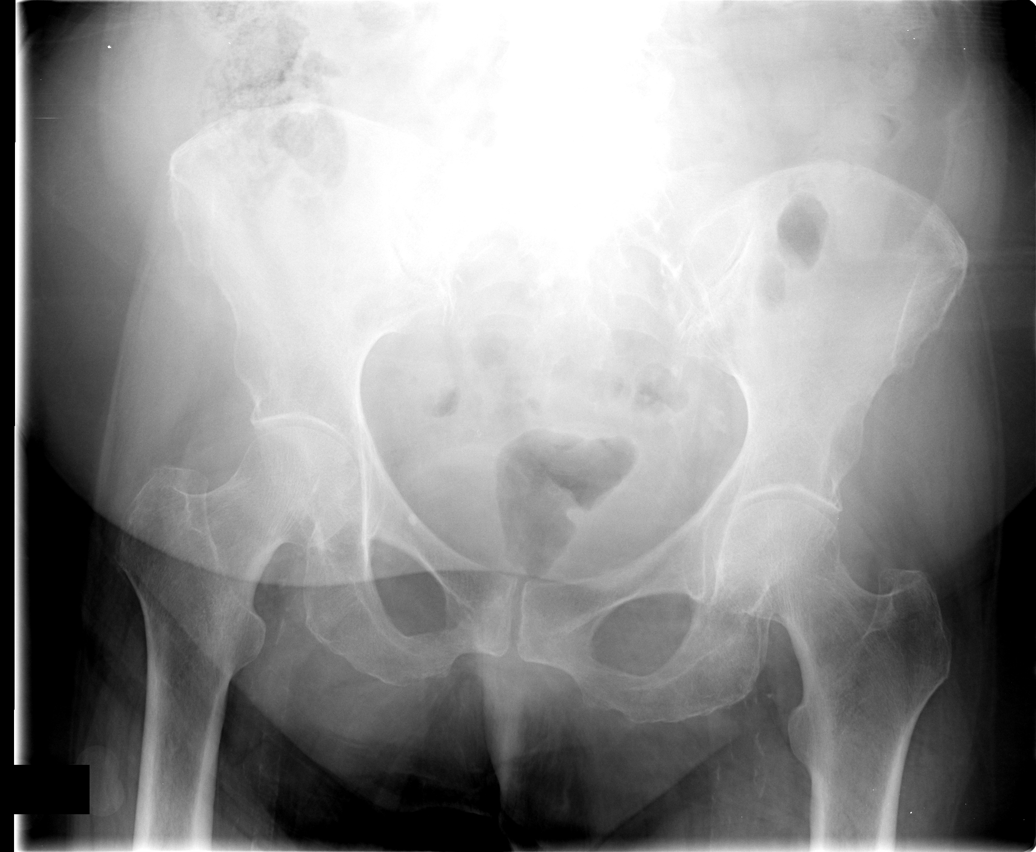

[view not recorded (2 of 5)]
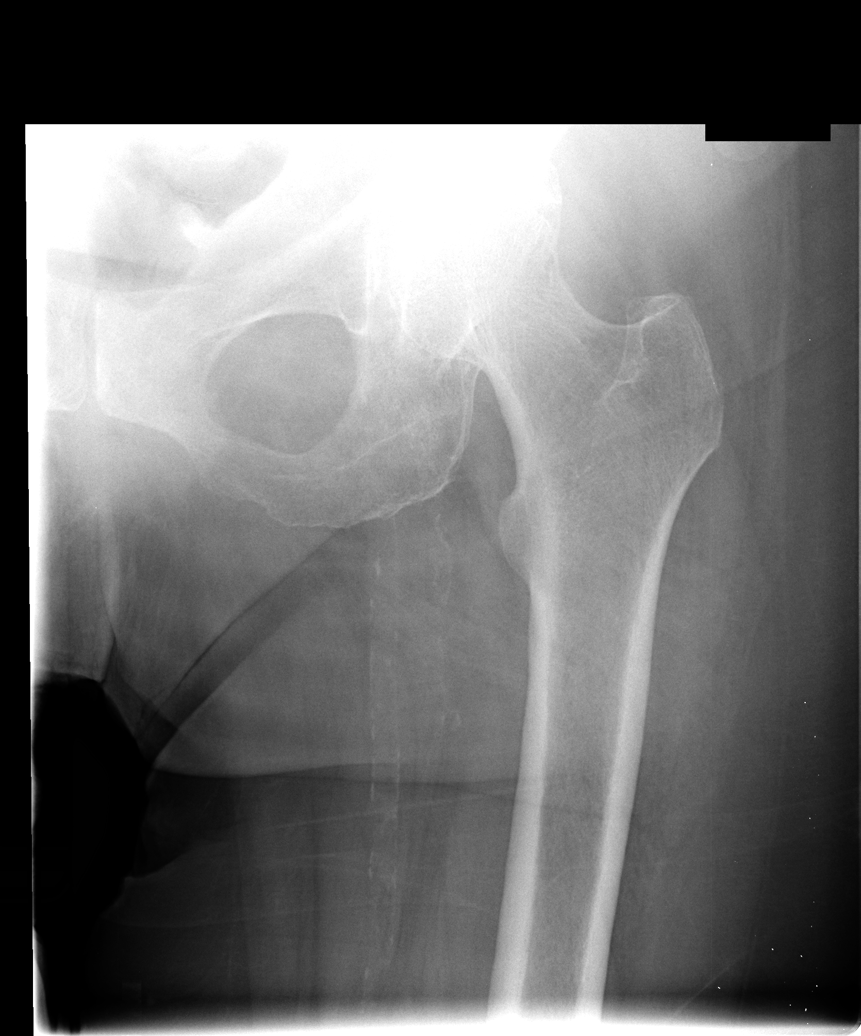

[view not recorded (3 of 5)]
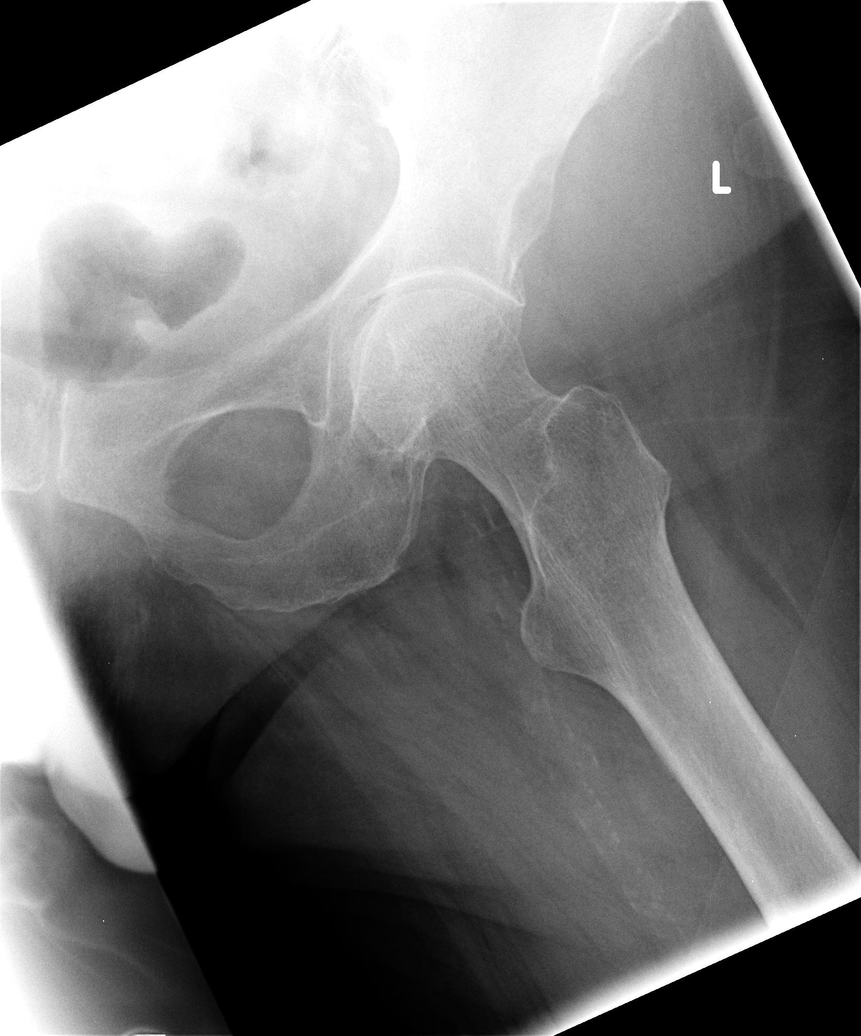

[view not recorded (4 of 5)]
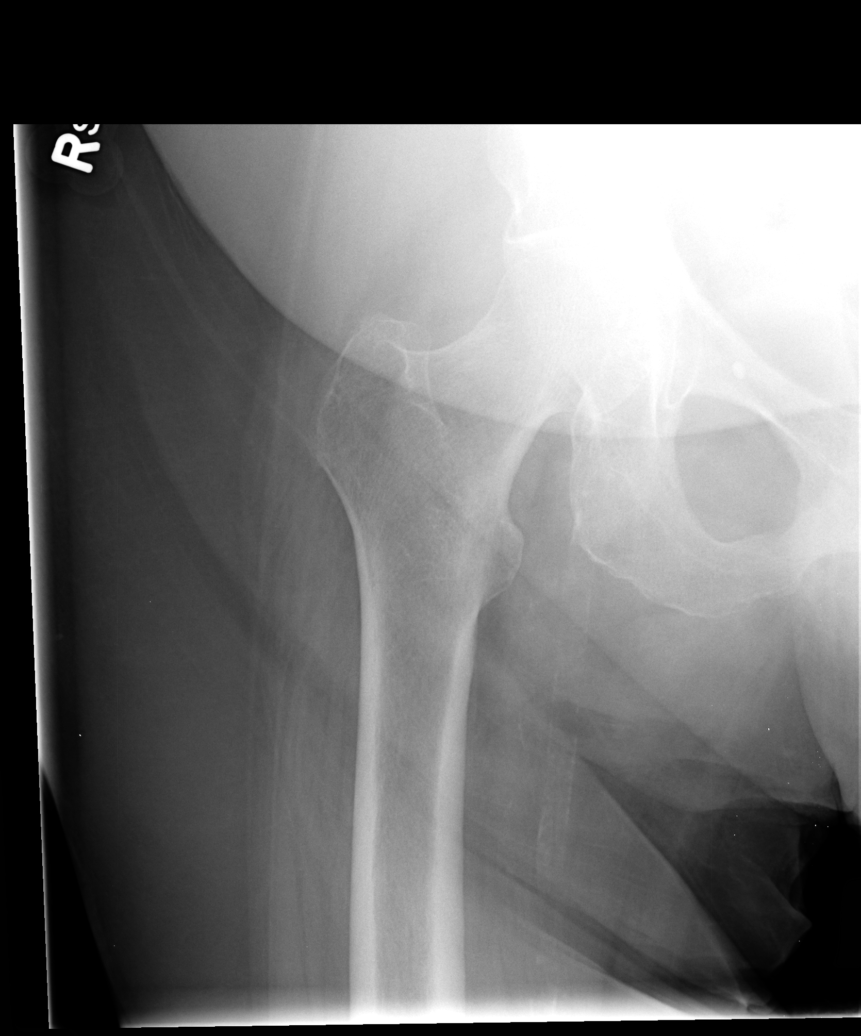

[view not recorded (5 of 5)]
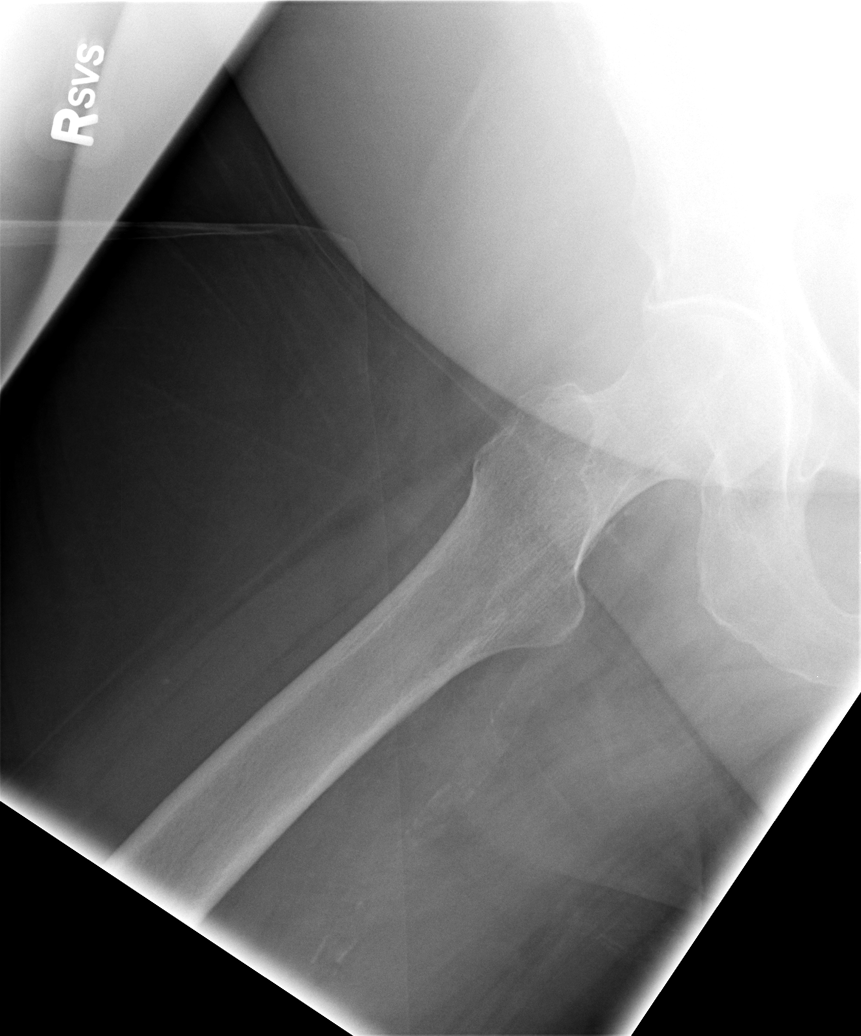

[5 of 5 positions shown; findings below may reference images not displayed]

FINDINGS: Hips are located.  There is mild joint space narrowing of
the hips bilaterally.  No evidence of femoral neck fracture.  No
evidence of dislocation.  No evidence of pelvic or sacral fracture.
IMPRESSION: 1.  No acute findings in the hips.
2.  Mild osteoarthritis.

## 2011-10-04 LAB — GLUCOSE, CAPILLARY

## 2011-10-05 LAB — GLUCOSE, CAPILLARY
Glucose-Capillary: 94 mg/dL (ref 70–99)
Glucose-Capillary: 172 mg/dL — ABNORMAL HIGH (ref 70–99)

## 2011-10-06 LAB — GLUCOSE, CAPILLARY: Glucose-Capillary: 166 mg/dL — ABNORMAL HIGH (ref 70–99)

## 2011-10-07 LAB — GLUCOSE, CAPILLARY: Glucose-Capillary: 98 mg/dL (ref 70–99)

## 2011-10-08 LAB — GLUCOSE, CAPILLARY: Glucose-Capillary: 104 mg/dL — ABNORMAL HIGH (ref 70–99)

## 2011-10-10 LAB — GLUCOSE, CAPILLARY: Glucose-Capillary: 143 mg/dL — ABNORMAL HIGH (ref 70–99)

## 2011-10-11 LAB — GLUCOSE, CAPILLARY: Glucose-Capillary: 97 mg/dL (ref 70–99)

## 2011-10-12 LAB — GLUCOSE, CAPILLARY: Glucose-Capillary: 141 mg/dL — ABNORMAL HIGH (ref 70–99)

## 2011-10-13 LAB — GLUCOSE, CAPILLARY: Glucose-Capillary: 136 mg/dL — ABNORMAL HIGH (ref 70–99)

## 2011-10-16 LAB — GLUCOSE, CAPILLARY
Glucose-Capillary: 88 mg/dL (ref 70–99)
Glucose-Capillary: 102 mg/dL — ABNORMAL HIGH (ref 70–99)

## 2011-10-17 LAB — GLUCOSE, CAPILLARY: Glucose-Capillary: 87 mg/dL (ref 70–99)

## 2011-10-18 LAB — GLUCOSE, CAPILLARY: Glucose-Capillary: 104 mg/dL — ABNORMAL HIGH (ref 70–99)

## 2011-10-20 LAB — GLUCOSE, CAPILLARY
Glucose-Capillary: 112 mg/dL — ABNORMAL HIGH (ref 70–99)
Glucose-Capillary: 151 mg/dL — ABNORMAL HIGH (ref 70–99)

## 2011-10-21 LAB — GLUCOSE, CAPILLARY: Glucose-Capillary: 128 mg/dL — ABNORMAL HIGH (ref 70–99)

## 2011-10-22 LAB — GLUCOSE, CAPILLARY: Glucose-Capillary: 89 mg/dL (ref 70–99)

## 2011-10-26 LAB — GLUCOSE, CAPILLARY
Glucose-Capillary: 152 mg/dL — ABNORMAL HIGH (ref 70–99)
Glucose-Capillary: 90 mg/dL (ref 70–99)
Glucose-Capillary: 144 mg/dL — ABNORMAL HIGH (ref 70–99)

## 2011-10-27 LAB — GLUCOSE, CAPILLARY: Glucose-Capillary: 95 mg/dL (ref 70–99)

## 2011-10-28 LAB — GLUCOSE, CAPILLARY: Glucose-Capillary: 139 mg/dL — ABNORMAL HIGH (ref 70–99)

## 2011-11-01 LAB — GLUCOSE, CAPILLARY: Glucose-Capillary: 160 mg/dL — ABNORMAL HIGH (ref 70–99)

## 2011-11-02 LAB — GLUCOSE, CAPILLARY
Glucose-Capillary: 87 mg/dL (ref 70–99)
Glucose-Capillary: 144 mg/dL — ABNORMAL HIGH (ref 70–99)

## 2011-11-03 LAB — GLUCOSE, CAPILLARY
Glucose-Capillary: 92 mg/dL (ref 70–99)
Glucose-Capillary: 149 mg/dL — ABNORMAL HIGH (ref 70–99)

## 2011-11-04 LAB — GLUCOSE, CAPILLARY: Glucose-Capillary: 87 mg/dL (ref 70–99)

## 2011-11-06 LAB — GLUCOSE, CAPILLARY: Glucose-Capillary: 122 mg/dL — ABNORMAL HIGH (ref 70–99)

## 2011-11-07 LAB — GLUCOSE, CAPILLARY
Glucose-Capillary: 90 mg/dL (ref 70–99)
Glucose-Capillary: 128 mg/dL — ABNORMAL HIGH (ref 70–99)

## 2011-11-08 LAB — GLUCOSE, CAPILLARY: Glucose-Capillary: 82 mg/dL (ref 70–99)

## 2011-11-09 LAB — GLUCOSE, CAPILLARY: Glucose-Capillary: 137 mg/dL — ABNORMAL HIGH (ref 70–99)

## 2011-11-11 LAB — GLUCOSE, CAPILLARY
Glucose-Capillary: 84 mg/dL (ref 70–99)
Glucose-Capillary: 157 mg/dL — ABNORMAL HIGH (ref 70–99)

## 2011-11-12 LAB — GLUCOSE, CAPILLARY
Glucose-Capillary: 88 mg/dL (ref 70–99)
Glucose-Capillary: 138 mg/dL — ABNORMAL HIGH (ref 70–99)

## 2011-11-13 LAB — GLUCOSE, CAPILLARY
Glucose-Capillary: 91 mg/dL (ref 70–99)
Glucose-Capillary: 158 mg/dL — ABNORMAL HIGH (ref 70–99)

## 2011-11-15 LAB — GLUCOSE, CAPILLARY

## 2011-11-16 LAB — GLUCOSE, CAPILLARY
Glucose-Capillary: 104 mg/dL — ABNORMAL HIGH (ref 70–99)
Glucose-Capillary: 136 mg/dL — ABNORMAL HIGH (ref 70–99)

## 2011-11-17 LAB — GLUCOSE, CAPILLARY
Glucose-Capillary: 91 mg/dL (ref 70–99)
Glucose-Capillary: 153 mg/dL — ABNORMAL HIGH (ref 70–99)

## 2011-11-19 LAB — GLUCOSE, CAPILLARY: Glucose-Capillary: 92 mg/dL (ref 70–99)

## 2011-11-20 LAB — GLUCOSE, CAPILLARY: Glucose-Capillary: 112 mg/dL — ABNORMAL HIGH (ref 70–99)

## 2011-11-21 LAB — GLUCOSE, CAPILLARY
Glucose-Capillary: 81 mg/dL (ref 70–99)
Glucose-Capillary: 98 mg/dL (ref 70–99)

## 2011-11-22 LAB — GLUCOSE, CAPILLARY: Glucose-Capillary: 105 mg/dL — ABNORMAL HIGH (ref 70–99)

## 2011-11-24 LAB — GLUCOSE, CAPILLARY: Glucose-Capillary: 96 mg/dL (ref 70–99)

## 2011-11-25 LAB — GLUCOSE, CAPILLARY: Glucose-Capillary: 152 mg/dL — ABNORMAL HIGH (ref 70–99)

## 2011-11-26 LAB — GLUCOSE, CAPILLARY: Glucose-Capillary: 101 mg/dL — ABNORMAL HIGH (ref 70–99)

## 2011-11-28 LAB — GLUCOSE, CAPILLARY: Glucose-Capillary: 92 mg/dL (ref 70–99)

## 2011-11-29 LAB — GLUCOSE, CAPILLARY: Glucose-Capillary: 99 mg/dL (ref 70–99)

## 2011-11-30 LAB — GLUCOSE, CAPILLARY: Glucose-Capillary: 82 mg/dL (ref 70–99)

## 2011-12-01 LAB — GLUCOSE, CAPILLARY: Glucose-Capillary: 102 mg/dL — ABNORMAL HIGH (ref 70–99)

## 2011-12-03 LAB — GLUCOSE, CAPILLARY: Glucose-Capillary: 129 mg/dL — ABNORMAL HIGH (ref 70–99)

## 2011-12-04 LAB — GLUCOSE, CAPILLARY: Glucose-Capillary: 134 mg/dL — ABNORMAL HIGH (ref 70–99)

## 2011-12-05 LAB — GLUCOSE, CAPILLARY
Glucose-Capillary: 95 mg/dL (ref 70–99)
Glucose-Capillary: 111 mg/dL — ABNORMAL HIGH (ref 70–99)

## 2011-12-07 LAB — GLUCOSE, CAPILLARY: Glucose-Capillary: 91 mg/dL (ref 70–99)

## 2011-12-09 LAB — GLUCOSE, CAPILLARY
Glucose-Capillary: 86 mg/dL (ref 70–99)
Glucose-Capillary: 76 mg/dL (ref 70–99)

## 2011-12-11 LAB — GLUCOSE, CAPILLARY: Glucose-Capillary: 89 mg/dL (ref 70–99)

## 2011-12-12 LAB — GLUCOSE, CAPILLARY: Glucose-Capillary: 126 mg/dL — ABNORMAL HIGH (ref 70–99)

## 2011-12-13 LAB — GLUCOSE, CAPILLARY: Glucose-Capillary: 84 mg/dL (ref 70–99)

## 2011-12-14 LAB — GLUCOSE, CAPILLARY
Glucose-Capillary: 77 mg/dL (ref 70–99)
Glucose-Capillary: 161 mg/dL — ABNORMAL HIGH (ref 70–99)

## 2011-12-15 LAB — GLUCOSE, CAPILLARY: Glucose-Capillary: 92 mg/dL (ref 70–99)

## 2011-12-18 LAB — GLUCOSE, CAPILLARY
Glucose-Capillary: 77 mg/dL (ref 70–99)
Glucose-Capillary: 147 mg/dL — ABNORMAL HIGH (ref 70–99)

## 2011-12-19 LAB — GLUCOSE, CAPILLARY
Glucose-Capillary: 97 mg/dL (ref 70–99)
Glucose-Capillary: 124 mg/dL — ABNORMAL HIGH (ref 70–99)

## 2011-12-20 LAB — GLUCOSE, CAPILLARY
Glucose-Capillary: 88 mg/dL (ref 70–99)
Glucose-Capillary: 166 mg/dL — ABNORMAL HIGH (ref 70–99)

## 2011-12-21 LAB — GLUCOSE, CAPILLARY
Glucose-Capillary: 81 mg/dL (ref 70–99)
Glucose-Capillary: 148 mg/dL — ABNORMAL HIGH (ref 70–99)

## 2011-12-22 LAB — GLUCOSE, CAPILLARY: Glucose-Capillary: 154 mg/dL — ABNORMAL HIGH (ref 70–99)

## 2011-12-23 LAB — GLUCOSE, CAPILLARY: Glucose-Capillary: 146 mg/dL — ABNORMAL HIGH (ref 70–99)

## 2011-12-24 LAB — GLUCOSE, CAPILLARY
Glucose-Capillary: 98 mg/dL (ref 70–99)
Glucose-Capillary: 107 mg/dL — ABNORMAL HIGH (ref 70–99)

## 2011-12-25 LAB — GLUCOSE, CAPILLARY: Glucose-Capillary: 135 mg/dL — ABNORMAL HIGH (ref 70–99)

## 2011-12-26 LAB — GLUCOSE, CAPILLARY: Glucose-Capillary: 82 mg/dL (ref 70–99)

## 2011-12-28 LAB — GLUCOSE, CAPILLARY: Glucose-Capillary: 88 mg/dL (ref 70–99)

## 2011-12-29 LAB — GLUCOSE, CAPILLARY
Glucose-Capillary: 87 mg/dL (ref 70–99)
Glucose-Capillary: 144 mg/dL — ABNORMAL HIGH (ref 70–99)

## 2011-12-31 LAB — GLUCOSE, CAPILLARY: Glucose-Capillary: 80 mg/dL (ref 70–99)

## 2012-01-02 LAB — GLUCOSE, CAPILLARY: Glucose-Capillary: 87 mg/dL (ref 70–99)

## 2012-01-03 LAB — GLUCOSE, CAPILLARY
Glucose-Capillary: 90 mg/dL (ref 70–99)
Glucose-Capillary: 114 mg/dL — ABNORMAL HIGH (ref 70–99)

## 2012-01-04 LAB — GLUCOSE, CAPILLARY

## 2012-01-06 LAB — GLUCOSE, CAPILLARY
Glucose-Capillary: 86 mg/dL (ref 70–99)
Glucose-Capillary: 138 mg/dL — ABNORMAL HIGH (ref 70–99)

## 2012-01-07 LAB — GLUCOSE, CAPILLARY
Glucose-Capillary: 101 mg/dL — ABNORMAL HIGH (ref 70–99)
Glucose-Capillary: 200 mg/dL — ABNORMAL HIGH (ref 70–99)

## 2012-01-08 LAB — GLUCOSE, CAPILLARY: Glucose-Capillary: 95 mg/dL (ref 70–99)

## 2012-01-09 LAB — GLUCOSE, CAPILLARY: Glucose-Capillary: 126 mg/dL — ABNORMAL HIGH (ref 70–99)

## 2012-01-11 LAB — GLUCOSE, CAPILLARY: Glucose-Capillary: 161 mg/dL — ABNORMAL HIGH (ref 70–99)

## 2012-01-12 LAB — GLUCOSE, CAPILLARY
Glucose-Capillary: 88 mg/dL (ref 70–99)
Glucose-Capillary: 134 mg/dL — ABNORMAL HIGH (ref 70–99)

## 2012-01-13 LAB — GLUCOSE, CAPILLARY
Glucose-Capillary: 98 mg/dL (ref 70–99)
Glucose-Capillary: 168 mg/dL — ABNORMAL HIGH (ref 70–99)

## 2012-01-16 LAB — GLUCOSE, CAPILLARY: Glucose-Capillary: 123 mg/dL — ABNORMAL HIGH (ref 70–99)

## 2012-01-20 LAB — GLUCOSE, CAPILLARY
Glucose-Capillary: 98 mg/dL (ref 70–99)
Glucose-Capillary: 100 mg/dL — ABNORMAL HIGH (ref 70–99)

## 2012-01-21 LAB — GLUCOSE, CAPILLARY: Glucose-Capillary: 171 mg/dL — ABNORMAL HIGH (ref 70–99)

## 2012-01-22 LAB — GLUCOSE, CAPILLARY: Glucose-Capillary: 96 mg/dL (ref 70–99)

## 2012-01-25 LAB — GLUCOSE, CAPILLARY: Glucose-Capillary: 106 mg/dL — ABNORMAL HIGH (ref 70–99)

## 2012-01-26 LAB — GLUCOSE, CAPILLARY
Glucose-Capillary: 123 mg/dL — ABNORMAL HIGH (ref 70–99)
Glucose-Capillary: 185 mg/dL — ABNORMAL HIGH (ref 70–99)

## 2012-01-27 LAB — GLUCOSE, CAPILLARY: Glucose-Capillary: 186 mg/dL — ABNORMAL HIGH (ref 70–99)

## 2012-01-28 LAB — GLUCOSE, CAPILLARY: Glucose-Capillary: 126 mg/dL — ABNORMAL HIGH (ref 70–99)

## 2012-01-30 LAB — GLUCOSE, CAPILLARY: Glucose-Capillary: 137 mg/dL — ABNORMAL HIGH (ref 70–99)

## 2012-02-01 LAB — GLUCOSE, CAPILLARY
Glucose-Capillary: 182 mg/dL — ABNORMAL HIGH (ref 70–99)
Glucose-Capillary: 93 mg/dL (ref 70–99)
Glucose-Capillary: 127 mg/dL — ABNORMAL HIGH (ref 70–99)

## 2012-02-02 LAB — GLUCOSE, CAPILLARY: Glucose-Capillary: 86 mg/dL (ref 70–99)

## 2012-02-04 LAB — GLUCOSE, CAPILLARY: Glucose-Capillary: 137 mg/dL — ABNORMAL HIGH (ref 70–99)

## 2012-02-06 LAB — GLUCOSE, CAPILLARY: Glucose-Capillary: 87 mg/dL (ref 70–99)

## 2012-02-07 LAB — GLUCOSE, CAPILLARY
Glucose-Capillary: 83 mg/dL (ref 70–99)
Glucose-Capillary: 124 mg/dL — ABNORMAL HIGH (ref 70–99)

## 2012-02-08 LAB — GLUCOSE, CAPILLARY: Glucose-Capillary: 108 mg/dL — ABNORMAL HIGH (ref 70–99)

## 2012-02-09 LAB — GLUCOSE, CAPILLARY
Glucose-Capillary: 90 mg/dL (ref 70–99)
Glucose-Capillary: 140 mg/dL — ABNORMAL HIGH (ref 70–99)

## 2012-02-10 LAB — GLUCOSE, CAPILLARY
Glucose-Capillary: 90 mg/dL (ref 70–99)
Glucose-Capillary: 154 mg/dL — ABNORMAL HIGH (ref 70–99)

## 2012-02-11 LAB — GLUCOSE, CAPILLARY: Glucose-Capillary: 98 mg/dL (ref 70–99)

## 2012-02-13 LAB — GLUCOSE, CAPILLARY: Glucose-Capillary: 117 mg/dL — ABNORMAL HIGH (ref 70–99)

## 2012-02-15 LAB — GLUCOSE, CAPILLARY: Glucose-Capillary: 139 mg/dL — ABNORMAL HIGH (ref 70–99)

## 2012-02-16 LAB — GLUCOSE, CAPILLARY: Glucose-Capillary: 179 mg/dL — ABNORMAL HIGH (ref 70–99)

## 2012-02-17 LAB — GLUCOSE, CAPILLARY
Glucose-Capillary: 88 mg/dL (ref 70–99)
Glucose-Capillary: 103 mg/dL — ABNORMAL HIGH (ref 70–99)

## 2012-02-19 LAB — GLUCOSE, CAPILLARY: Glucose-Capillary: 89 mg/dL (ref 70–99)

## 2012-02-20 LAB — GLUCOSE, CAPILLARY: Glucose-Capillary: 143 mg/dL — ABNORMAL HIGH (ref 70–99)

## 2012-02-21 LAB — GLUCOSE, CAPILLARY
Glucose-Capillary: 111 mg/dL — ABNORMAL HIGH (ref 70–99)
Glucose-Capillary: 149 mg/dL — ABNORMAL HIGH (ref 70–99)

## 2012-02-22 LAB — GLUCOSE, CAPILLARY: Glucose-Capillary: 98 mg/dL (ref 70–99)

## 2012-02-23 LAB — GLUCOSE, CAPILLARY: Glucose-Capillary: 110 mg/dL — ABNORMAL HIGH (ref 70–99)

## 2012-02-24 LAB — GLUCOSE, CAPILLARY
Glucose-Capillary: 107 mg/dL — ABNORMAL HIGH (ref 70–99)
Glucose-Capillary: 128 mg/dL — ABNORMAL HIGH (ref 70–99)

## 2012-02-25 LAB — GLUCOSE, CAPILLARY
Glucose-Capillary: 106 mg/dL — ABNORMAL HIGH (ref 70–99)
Glucose-Capillary: 205 mg/dL — ABNORMAL HIGH (ref 70–99)

## 2012-02-26 LAB — GLUCOSE, CAPILLARY
Glucose-Capillary: 93 mg/dL (ref 70–99)
Glucose-Capillary: 159 mg/dL — ABNORMAL HIGH (ref 70–99)

## 2012-02-27 LAB — GLUCOSE, CAPILLARY: Glucose-Capillary: 161 mg/dL — ABNORMAL HIGH (ref 70–99)

## 2012-02-28 ENCOUNTER — Ambulatory Visit (HOSPITAL_COMMUNITY): Payer: Medicare Other | Attending: Internal Medicine

## 2012-02-28 DIAGNOSIS — R059 Cough, unspecified: Secondary | ICD-10-CM | POA: Insufficient documentation

## 2012-02-28 DIAGNOSIS — R05 Cough: Secondary | ICD-10-CM | POA: Insufficient documentation

## 2012-02-28 DIAGNOSIS — R0989 Other specified symptoms and signs involving the circulatory and respiratory systems: Secondary | ICD-10-CM | POA: Insufficient documentation

## 2012-02-28 DIAGNOSIS — Z951 Presence of aortocoronary bypass graft: Secondary | ICD-10-CM | POA: Insufficient documentation

## 2012-02-28 LAB — GLUCOSE, CAPILLARY: Glucose-Capillary: 95 mg/dL (ref 70–99)

## 2012-02-29 LAB — GLUCOSE, CAPILLARY: Glucose-Capillary: 135 mg/dL — ABNORMAL HIGH (ref 70–99)

## 2012-03-01 LAB — GLUCOSE, CAPILLARY

## 2012-03-02 LAB — GLUCOSE, CAPILLARY: Glucose-Capillary: 91 mg/dL (ref 70–99)

## 2012-03-03 LAB — GLUCOSE, CAPILLARY
Glucose-Capillary: 81 mg/dL (ref 70–99)
Glucose-Capillary: 81 mg/dL (ref 70–99)

## 2012-03-04 LAB — GLUCOSE, CAPILLARY: Glucose-Capillary: 89 mg/dL (ref 70–99)

## 2012-03-05 LAB — GLUCOSE, CAPILLARY: Glucose-Capillary: 156 mg/dL — ABNORMAL HIGH (ref 70–99)

## 2012-03-06 LAB — GLUCOSE, CAPILLARY
Glucose-Capillary: 80 mg/dL (ref 70–99)
Glucose-Capillary: 151 mg/dL — ABNORMAL HIGH (ref 70–99)

## 2012-03-07 LAB — GLUCOSE, CAPILLARY: Glucose-Capillary: 97 mg/dL (ref 70–99)

## 2012-03-08 LAB — GLUCOSE, CAPILLARY: Glucose-Capillary: 102 mg/dL — ABNORMAL HIGH (ref 70–99)

## 2012-03-09 LAB — GLUCOSE, CAPILLARY: Glucose-Capillary: 87 mg/dL (ref 70–99)

## 2012-03-10 LAB — GLUCOSE, CAPILLARY: Glucose-Capillary: 83 mg/dL (ref 70–99)

## 2012-03-12 LAB — GLUCOSE, CAPILLARY: Glucose-Capillary: 106 mg/dL — ABNORMAL HIGH (ref 70–99)

## 2012-03-13 LAB — GLUCOSE, CAPILLARY: Glucose-Capillary: 86 mg/dL (ref 70–99)

## 2012-03-14 LAB — GLUCOSE, CAPILLARY: Glucose-Capillary: 74 mg/dL (ref 70–99)

## 2012-03-15 LAB — GLUCOSE, CAPILLARY: Glucose-Capillary: 104 mg/dL — ABNORMAL HIGH (ref 70–99)

## 2012-03-16 LAB — GLUCOSE, CAPILLARY: Glucose-Capillary: 149 mg/dL — ABNORMAL HIGH (ref 70–99)

## 2012-03-17 LAB — GLUCOSE, CAPILLARY: Glucose-Capillary: 79 mg/dL (ref 70–99)

## 2012-03-18 LAB — GLUCOSE, CAPILLARY: Glucose-Capillary: 91 mg/dL (ref 70–99)

## 2012-03-19 LAB — GLUCOSE, CAPILLARY

## 2012-03-20 LAB — GLUCOSE, CAPILLARY: Glucose-Capillary: 88 mg/dL (ref 70–99)

## 2012-03-21 LAB — GLUCOSE, CAPILLARY

## 2012-03-22 LAB — GLUCOSE, CAPILLARY
Glucose-Capillary: 85 mg/dL (ref 70–99)
Glucose-Capillary: 121 mg/dL — ABNORMAL HIGH (ref 70–99)

## 2012-03-23 LAB — GLUCOSE, CAPILLARY: Glucose-Capillary: 98 mg/dL (ref 70–99)

## 2012-03-24 LAB — GLUCOSE, CAPILLARY
Glucose-Capillary: 80 mg/dL (ref 70–99)
Glucose-Capillary: 98 mg/dL (ref 70–99)

## 2012-03-26 LAB — GLUCOSE, CAPILLARY: Glucose-Capillary: 92 mg/dL (ref 70–99)

## 2012-03-27 LAB — GLUCOSE, CAPILLARY
Glucose-Capillary: 72 mg/dL (ref 70–99)
Glucose-Capillary: 154 mg/dL — ABNORMAL HIGH (ref 70–99)

## 2012-03-30 LAB — GLUCOSE, CAPILLARY
Glucose-Capillary: 91 mg/dL (ref 70–99)
Glucose-Capillary: 74 mg/dL (ref 70–99)

## 2012-03-31 LAB — GLUCOSE, CAPILLARY: Glucose-Capillary: 156 mg/dL — ABNORMAL HIGH (ref 70–99)

## 2012-04-03 LAB — GLUCOSE, CAPILLARY: Glucose-Capillary: 79 mg/dL (ref 70–99)

## 2012-04-04 LAB — GLUCOSE, CAPILLARY
Glucose-Capillary: 81 mg/dL (ref 70–99)
Glucose-Capillary: 125 mg/dL — ABNORMAL HIGH (ref 70–99)

## 2012-04-05 LAB — GLUCOSE, CAPILLARY
Glucose-Capillary: 92 mg/dL (ref 70–99)
Glucose-Capillary: 159 mg/dL — ABNORMAL HIGH (ref 70–99)

## 2012-04-07 LAB — GLUCOSE, CAPILLARY: Glucose-Capillary: 89 mg/dL (ref 70–99)

## 2012-04-09 LAB — GLUCOSE, CAPILLARY: Glucose-Capillary: 175 mg/dL — ABNORMAL HIGH (ref 70–99)

## 2012-04-10 LAB — GLUCOSE, CAPILLARY
Glucose-Capillary: 86 mg/dL (ref 70–99)
Glucose-Capillary: 145 mg/dL — ABNORMAL HIGH (ref 70–99)

## 2012-04-11 LAB — GLUCOSE, CAPILLARY: Glucose-Capillary: 181 mg/dL — ABNORMAL HIGH (ref 70–99)

## 2012-04-13 LAB — GLUCOSE, CAPILLARY
Glucose-Capillary: 90 mg/dL (ref 70–99)
Glucose-Capillary: 101 mg/dL — ABNORMAL HIGH (ref 70–99)

## 2012-04-14 LAB — GLUCOSE, CAPILLARY: Glucose-Capillary: 83 mg/dL (ref 70–99)

## 2012-04-15 LAB — GLUCOSE, CAPILLARY
Glucose-Capillary: 146 mg/dL — ABNORMAL HIGH (ref 70–99)
Glucose-Capillary: 100 mg/dL — ABNORMAL HIGH (ref 70–99)

## 2012-04-17 LAB — GLUCOSE, CAPILLARY

## 2012-04-19 LAB — GLUCOSE, CAPILLARY: Glucose-Capillary: 105 mg/dL — ABNORMAL HIGH (ref 70–99)

## 2012-04-20 LAB — GLUCOSE, CAPILLARY: Glucose-Capillary: 136 mg/dL — ABNORMAL HIGH (ref 70–99)

## 2012-04-21 LAB — GLUCOSE, CAPILLARY: Glucose-Capillary: 83 mg/dL (ref 70–99)

## 2012-04-22 LAB — GLUCOSE, CAPILLARY: Glucose-Capillary: 81 mg/dL (ref 70–99)

## 2012-04-23 LAB — GLUCOSE, CAPILLARY: Glucose-Capillary: 97 mg/dL (ref 70–99)

## 2012-04-25 LAB — GLUCOSE, CAPILLARY: Glucose-Capillary: 86 mg/dL (ref 70–99)

## 2012-04-27 LAB — GLUCOSE, CAPILLARY
Glucose-Capillary: 83 mg/dL (ref 70–99)
Glucose-Capillary: 175 mg/dL — ABNORMAL HIGH (ref 70–99)

## 2012-04-28 LAB — GLUCOSE, CAPILLARY: Glucose-Capillary: 122 mg/dL — ABNORMAL HIGH (ref 70–99)

## 2012-04-30 LAB — GLUCOSE, CAPILLARY
Glucose-Capillary: 109 mg/dL — ABNORMAL HIGH (ref 70–99)
Glucose-Capillary: 186 mg/dL — ABNORMAL HIGH (ref 70–99)

## 2012-05-01 LAB — GLUCOSE, CAPILLARY: Glucose-Capillary: 102 mg/dL — ABNORMAL HIGH (ref 70–99)

## 2012-05-02 LAB — GLUCOSE, CAPILLARY

## 2012-05-03 LAB — GLUCOSE, CAPILLARY: Glucose-Capillary: 115 mg/dL — ABNORMAL HIGH (ref 70–99)

## 2012-05-04 LAB — GLUCOSE, CAPILLARY
Glucose-Capillary: 98 mg/dL (ref 70–99)
Glucose-Capillary: 156 mg/dL — ABNORMAL HIGH (ref 70–99)

## 2012-05-05 LAB — GLUCOSE, CAPILLARY: Glucose-Capillary: 84 mg/dL (ref 70–99)

## 2012-05-07 LAB — GLUCOSE, CAPILLARY

## 2012-05-08 LAB — GLUCOSE, CAPILLARY: Glucose-Capillary: 133 mg/dL — ABNORMAL HIGH (ref 70–99)

## 2012-05-09 LAB — GLUCOSE, CAPILLARY: Glucose-Capillary: 159 mg/dL — ABNORMAL HIGH (ref 70–99)

## 2012-05-11 LAB — GLUCOSE, CAPILLARY

## 2012-05-12 LAB — GLUCOSE, CAPILLARY: Glucose-Capillary: 155 mg/dL — ABNORMAL HIGH (ref 70–99)

## 2012-05-13 LAB — GLUCOSE, CAPILLARY: Glucose-Capillary: 90 mg/dL (ref 70–99)

## 2012-05-14 LAB — GLUCOSE, CAPILLARY: Glucose-Capillary: 90 mg/dL (ref 70–99)

## 2012-05-15 LAB — GLUCOSE, CAPILLARY: Glucose-Capillary: 81 mg/dL (ref 70–99)

## 2012-05-16 LAB — GLUCOSE, CAPILLARY: Glucose-Capillary: 141 mg/dL — ABNORMAL HIGH (ref 70–99)

## 2012-05-17 LAB — GLUCOSE, CAPILLARY: Glucose-Capillary: 151 mg/dL — ABNORMAL HIGH (ref 70–99)

## 2012-05-19 LAB — GLUCOSE, CAPILLARY

## 2012-05-20 LAB — GLUCOSE, CAPILLARY: Glucose-Capillary: 147 mg/dL — ABNORMAL HIGH (ref 70–99)

## 2012-05-21 LAB — GLUCOSE, CAPILLARY: Glucose-Capillary: 130 mg/dL — ABNORMAL HIGH (ref 70–99)

## 2012-05-23 LAB — GLUCOSE, CAPILLARY: Glucose-Capillary: 94 mg/dL (ref 70–99)

## 2012-05-24 LAB — GLUCOSE, CAPILLARY
Glucose-Capillary: 88 mg/dL (ref 70–99)
Glucose-Capillary: 149 mg/dL — ABNORMAL HIGH (ref 70–99)

## 2012-05-25 LAB — GLUCOSE, CAPILLARY: Glucose-Capillary: 107 mg/dL — ABNORMAL HIGH (ref 70–99)

## 2012-05-26 LAB — GLUCOSE, CAPILLARY

## 2012-05-28 LAB — GLUCOSE, CAPILLARY
Glucose-Capillary: 147 mg/dL — ABNORMAL HIGH (ref 70–99)
Glucose-Capillary: 92 mg/dL (ref 70–99)
Glucose-Capillary: 142 mg/dL — ABNORMAL HIGH (ref 70–99)

## 2012-05-29 LAB — GLUCOSE, CAPILLARY

## 2012-05-30 LAB — GLUCOSE, CAPILLARY
Glucose-Capillary: 87 mg/dL (ref 70–99)
Glucose-Capillary: 105 mg/dL — ABNORMAL HIGH (ref 70–99)

## 2012-05-31 LAB — GLUCOSE, CAPILLARY: Glucose-Capillary: 94 mg/dL (ref 70–99)

## 2012-06-01 LAB — GLUCOSE, CAPILLARY
Glucose-Capillary: 71 mg/dL (ref 70–99)
Glucose-Capillary: 123 mg/dL — ABNORMAL HIGH (ref 70–99)

## 2012-06-02 LAB — GLUCOSE, CAPILLARY: Glucose-Capillary: 116 mg/dL — ABNORMAL HIGH (ref 70–99)

## 2012-06-03 LAB — GLUCOSE, CAPILLARY: Glucose-Capillary: 84 mg/dL (ref 70–99)

## 2012-06-04 LAB — GLUCOSE, CAPILLARY
Glucose-Capillary: 89 mg/dL (ref 70–99)
Glucose-Capillary: 121 mg/dL — ABNORMAL HIGH (ref 70–99)

## 2012-06-06 LAB — GLUCOSE, CAPILLARY: Glucose-Capillary: 87 mg/dL (ref 70–99)

## 2012-06-07 LAB — GLUCOSE, CAPILLARY: Glucose-Capillary: 97 mg/dL (ref 70–99)

## 2012-06-08 LAB — GLUCOSE, CAPILLARY: Glucose-Capillary: 85 mg/dL (ref 70–99)

## 2012-06-09 LAB — GLUCOSE, CAPILLARY: Glucose-Capillary: 76 mg/dL (ref 70–99)

## 2012-06-10 LAB — GLUCOSE, CAPILLARY: Glucose-Capillary: 85 mg/dL (ref 70–99)

## 2012-06-11 LAB — GLUCOSE, CAPILLARY: Glucose-Capillary: 92 mg/dL (ref 70–99)

## 2012-06-12 LAB — GLUCOSE, CAPILLARY: Glucose-Capillary: 84 mg/dL (ref 70–99)

## 2012-06-13 LAB — GLUCOSE, CAPILLARY
Glucose-Capillary: 96 mg/dL (ref 70–99)
Glucose-Capillary: 156 mg/dL — ABNORMAL HIGH (ref 70–99)

## 2012-06-15 LAB — GLUCOSE, CAPILLARY: Glucose-Capillary: 124 mg/dL — ABNORMAL HIGH (ref 70–99)

## 2012-06-17 LAB — GLUCOSE, CAPILLARY
Glucose-Capillary: 83 mg/dL (ref 70–99)
Glucose-Capillary: 97 mg/dL (ref 70–99)

## 2012-06-18 LAB — GLUCOSE, CAPILLARY

## 2012-06-19 LAB — GLUCOSE, CAPILLARY

## 2012-06-20 LAB — GLUCOSE, CAPILLARY: Glucose-Capillary: 84 mg/dL (ref 70–99)

## 2012-06-21 LAB — GLUCOSE, CAPILLARY: Glucose-Capillary: 78 mg/dL (ref 70–99)

## 2012-06-24 LAB — GLUCOSE, CAPILLARY: Glucose-Capillary: 89 mg/dL (ref 70–99)

## 2012-06-26 LAB — GLUCOSE, CAPILLARY
Glucose-Capillary: 91 mg/dL (ref 70–99)
Glucose-Capillary: 91 mg/dL (ref 70–99)

## 2012-06-27 LAB — GLUCOSE, CAPILLARY: Glucose-Capillary: 77 mg/dL (ref 70–99)

## 2012-06-30 LAB — GLUCOSE, CAPILLARY

## 2012-07-01 LAB — GLUCOSE, CAPILLARY: Glucose-Capillary: 89 mg/dL (ref 70–99)

## 2012-07-02 LAB — GLUCOSE, CAPILLARY: Glucose-Capillary: 100 mg/dL — ABNORMAL HIGH (ref 70–99)

## 2012-07-03 LAB — GLUCOSE, CAPILLARY

## 2012-07-06 LAB — GLUCOSE, CAPILLARY: Glucose-Capillary: 94 mg/dL (ref 70–99)

## 2012-07-07 LAB — GLUCOSE, CAPILLARY: Glucose-Capillary: 101 mg/dL — ABNORMAL HIGH (ref 70–99)

## 2012-07-08 LAB — GLUCOSE, CAPILLARY: Glucose-Capillary: 92 mg/dL (ref 70–99)

## 2012-07-14 LAB — GLUCOSE, CAPILLARY

## 2012-07-16 LAB — GLUCOSE, CAPILLARY
Glucose-Capillary: 82 mg/dL (ref 70–99)
Glucose-Capillary: 96 mg/dL (ref 70–99)

## 2012-07-21 LAB — GLUCOSE, CAPILLARY: Glucose-Capillary: 84 mg/dL (ref 70–99)

## 2012-07-22 LAB — GLUCOSE, CAPILLARY

## 2012-07-23 LAB — GLUCOSE, CAPILLARY: Glucose-Capillary: 89 mg/dL (ref 70–99)

## 2012-07-25 LAB — GLUCOSE, CAPILLARY: Glucose-Capillary: 99 mg/dL (ref 70–99)

## 2012-07-27 LAB — GLUCOSE, CAPILLARY: Glucose-Capillary: 96 mg/dL (ref 70–99)

## 2012-07-28 LAB — GLUCOSE, CAPILLARY

## 2012-07-30 LAB — GLUCOSE, CAPILLARY: Glucose-Capillary: 99 mg/dL (ref 70–99)

## 2012-07-31 LAB — GLUCOSE, CAPILLARY: Glucose-Capillary: 84 mg/dL (ref 70–99)

## 2012-08-01 LAB — GLUCOSE, CAPILLARY: Glucose-Capillary: 90 mg/dL (ref 70–99)

## 2012-08-03 LAB — GLUCOSE, CAPILLARY: Glucose-Capillary: 97 mg/dL (ref 70–99)

## 2012-08-04 LAB — GLUCOSE, CAPILLARY

## 2012-08-06 LAB — GLUCOSE, CAPILLARY: Glucose-Capillary: 97 mg/dL (ref 70–99)

## 2012-08-08 LAB — GLUCOSE, CAPILLARY: Glucose-Capillary: 84 mg/dL (ref 70–99)

## 2012-08-09 LAB — GLUCOSE, CAPILLARY: Glucose-Capillary: 92 mg/dL (ref 70–99)

## 2012-08-12 LAB — GLUCOSE, CAPILLARY: Glucose-Capillary: 91 mg/dL (ref 70–99)

## 2012-08-14 LAB — GLUCOSE, CAPILLARY: Glucose-Capillary: 82 mg/dL (ref 70–99)

## 2012-08-15 LAB — GLUCOSE, CAPILLARY: Glucose-Capillary: 89 mg/dL (ref 70–99)

## 2012-08-16 LAB — GLUCOSE, CAPILLARY: Glucose-Capillary: 82 mg/dL (ref 70–99)

## 2012-08-17 LAB — GLUCOSE, CAPILLARY: Glucose-Capillary: 110 mg/dL — ABNORMAL HIGH (ref 70–99)

## 2012-08-18 LAB — GLUCOSE, CAPILLARY: Glucose-Capillary: 100 mg/dL — ABNORMAL HIGH (ref 70–99)

## 2012-08-22 LAB — GLUCOSE, CAPILLARY

## 2012-08-23 LAB — GLUCOSE, CAPILLARY: Glucose-Capillary: 83 mg/dL (ref 70–99)

## 2012-08-24 LAB — GLUCOSE, CAPILLARY: Glucose-Capillary: 96 mg/dL (ref 70–99)

## 2012-08-25 LAB — GLUCOSE, CAPILLARY: Glucose-Capillary: 92 mg/dL (ref 70–99)

## 2012-08-26 LAB — GLUCOSE, CAPILLARY: Glucose-Capillary: 82 mg/dL (ref 70–99)

## 2012-08-28 LAB — GLUCOSE, CAPILLARY: Glucose-Capillary: 85 mg/dL (ref 70–99)

## 2012-08-31 LAB — GLUCOSE, CAPILLARY: Glucose-Capillary: 82 mg/dL (ref 70–99)

## 2012-09-01 LAB — GLUCOSE, CAPILLARY: Glucose-Capillary: 86 mg/dL (ref 70–99)

## 2012-09-02 LAB — GLUCOSE, CAPILLARY: Glucose-Capillary: 96 mg/dL (ref 70–99)

## 2012-09-03 LAB — GLUCOSE, CAPILLARY: Glucose-Capillary: 85 mg/dL (ref 70–99)

## 2012-09-04 LAB — GLUCOSE, CAPILLARY: Glucose-Capillary: 82 mg/dL (ref 70–99)

## 2012-09-08 LAB — GLUCOSE, CAPILLARY: Glucose-Capillary: 94 mg/dL (ref 70–99)

## 2012-09-09 LAB — GLUCOSE, CAPILLARY: Glucose-Capillary: 82 mg/dL (ref 70–99)

## 2012-09-11 LAB — GLUCOSE, CAPILLARY
Glucose-Capillary: 84 mg/dL (ref 70–99)
Glucose-Capillary: 90 mg/dL (ref 70–99)

## 2012-09-12 LAB — GLUCOSE, CAPILLARY: Glucose-Capillary: 81 mg/dL (ref 70–99)

## 2012-09-13 LAB — GLUCOSE, CAPILLARY: Glucose-Capillary: 95 mg/dL (ref 70–99)

## 2012-09-16 LAB — GLUCOSE, CAPILLARY: Glucose-Capillary: 94 mg/dL (ref 70–99)

## 2012-09-19 LAB — GLUCOSE, CAPILLARY: Glucose-Capillary: 89 mg/dL (ref 70–99)

## 2012-09-20 LAB — GLUCOSE, CAPILLARY: Glucose-Capillary: 78 mg/dL (ref 70–99)

## 2012-09-21 LAB — GLUCOSE, CAPILLARY: Glucose-Capillary: 75 mg/dL (ref 70–99)

## 2012-09-22 LAB — GLUCOSE, CAPILLARY: Glucose-Capillary: 92 mg/dL (ref 70–99)

## 2012-09-24 LAB — GLUCOSE, CAPILLARY

## 2012-09-26 LAB — GLUCOSE, CAPILLARY: Glucose-Capillary: 108 mg/dL — ABNORMAL HIGH (ref 70–99)

## 2012-09-27 LAB — GLUCOSE, CAPILLARY: Glucose-Capillary: 94 mg/dL (ref 70–99)

## 2012-09-29 LAB — GLUCOSE, CAPILLARY: Glucose-Capillary: 85 mg/dL (ref 70–99)

## 2012-09-30 LAB — GLUCOSE, CAPILLARY: Glucose-Capillary: 97 mg/dL (ref 70–99)

## 2012-10-02 LAB — GLUCOSE, CAPILLARY: Glucose-Capillary: 85 mg/dL (ref 70–99)

## 2012-10-03 LAB — GLUCOSE, CAPILLARY

## 2012-10-04 LAB — GLUCOSE, CAPILLARY: Glucose-Capillary: 84 mg/dL (ref 70–99)

## 2012-10-07 LAB — GLUCOSE, CAPILLARY: Glucose-Capillary: 81 mg/dL (ref 70–99)

## 2012-10-08 LAB — GLUCOSE, CAPILLARY: Glucose-Capillary: 78 mg/dL (ref 70–99)

## 2012-10-11 LAB — GLUCOSE, CAPILLARY: Glucose-Capillary: 92 mg/dL (ref 70–99)

## 2012-10-12 LAB — GLUCOSE, CAPILLARY: Glucose-Capillary: 86 mg/dL (ref 70–99)

## 2012-10-14 LAB — GLUCOSE, CAPILLARY: Glucose-Capillary: 92 mg/dL (ref 70–99)

## 2012-10-18 LAB — GLUCOSE, CAPILLARY: Glucose-Capillary: 87 mg/dL (ref 70–99)

## 2012-10-19 LAB — GLUCOSE, CAPILLARY: Glucose-Capillary: 89 mg/dL (ref 70–99)

## 2012-10-21 LAB — GLUCOSE, CAPILLARY: Glucose-Capillary: 87 mg/dL (ref 70–99)

## 2012-10-22 LAB — GLUCOSE, CAPILLARY: Glucose-Capillary: 82 mg/dL (ref 70–99)

## 2012-10-26 LAB — GLUCOSE, CAPILLARY: Glucose-Capillary: 88 mg/dL (ref 70–99)

## 2012-10-28 LAB — GLUCOSE, CAPILLARY

## 2012-10-29 LAB — GLUCOSE, CAPILLARY: Glucose-Capillary: 89 mg/dL (ref 70–99)

## 2012-10-30 LAB — GLUCOSE, CAPILLARY

## 2012-11-01 LAB — GLUCOSE, CAPILLARY: Glucose-Capillary: 103 mg/dL — ABNORMAL HIGH (ref 70–99)

## 2012-11-04 LAB — GLUCOSE, CAPILLARY: Glucose-Capillary: 85 mg/dL (ref 70–99)

## 2012-11-06 LAB — GLUCOSE, CAPILLARY

## 2012-11-09 LAB — GLUCOSE, CAPILLARY: Glucose-Capillary: 93 mg/dL (ref 70–99)

## 2012-11-10 LAB — GLUCOSE, CAPILLARY: Glucose-Capillary: 88 mg/dL (ref 70–99)

## 2012-11-11 LAB — GLUCOSE, CAPILLARY: Glucose-Capillary: 89 mg/dL (ref 70–99)

## 2012-11-12 LAB — GLUCOSE, CAPILLARY: Glucose-Capillary: 90 mg/dL (ref 70–99)

## 2012-11-13 LAB — GLUCOSE, CAPILLARY: Glucose-Capillary: 84 mg/dL (ref 70–99)

## 2012-11-14 LAB — GLUCOSE, CAPILLARY

## 2012-11-17 LAB — GLUCOSE, CAPILLARY: Glucose-Capillary: 88 mg/dL (ref 70–99)

## 2012-11-18 LAB — GLUCOSE, CAPILLARY: Glucose-Capillary: 89 mg/dL (ref 70–99)

## 2012-11-19 LAB — GLUCOSE, CAPILLARY

## 2012-11-20 LAB — GLUCOSE, CAPILLARY: Glucose-Capillary: 103 mg/dL — ABNORMAL HIGH (ref 70–99)

## 2012-11-21 LAB — GLUCOSE, CAPILLARY

## 2012-11-24 LAB — GLUCOSE, CAPILLARY: Glucose-Capillary: 92 mg/dL (ref 70–99)

## 2012-11-25 LAB — GLUCOSE, CAPILLARY: Glucose-Capillary: 81 mg/dL (ref 70–99)

## 2012-11-26 LAB — GLUCOSE, CAPILLARY: Glucose-Capillary: 81 mg/dL (ref 70–99)

## 2012-11-27 LAB — GLUCOSE, CAPILLARY

## 2012-11-28 LAB — GLUCOSE, CAPILLARY: Glucose-Capillary: 88 mg/dL (ref 70–99)

## 2012-11-30 LAB — GLUCOSE, CAPILLARY: Glucose-Capillary: 83 mg/dL (ref 70–99)

## 2012-12-01 LAB — GLUCOSE, CAPILLARY: Glucose-Capillary: 81 mg/dL (ref 70–99)

## 2012-12-02 LAB — GLUCOSE, CAPILLARY: Glucose-Capillary: 91 mg/dL (ref 70–99)

## 2012-12-03 LAB — GLUCOSE, CAPILLARY: Glucose-Capillary: 81 mg/dL (ref 70–99)

## 2012-12-06 LAB — GLUCOSE, CAPILLARY: Glucose-Capillary: 79 mg/dL (ref 70–99)

## 2012-12-07 LAB — GLUCOSE, CAPILLARY: Glucose-Capillary: 86 mg/dL (ref 70–99)

## 2012-12-09 LAB — GLUCOSE, CAPILLARY: Glucose-Capillary: 86 mg/dL (ref 70–99)

## 2012-12-11 LAB — GLUCOSE, CAPILLARY

## 2012-12-13 LAB — GLUCOSE, CAPILLARY: Glucose-Capillary: 89 mg/dL (ref 70–99)

## 2012-12-14 LAB — GLUCOSE, CAPILLARY: Glucose-Capillary: 93 mg/dL (ref 70–99)

## 2012-12-16 LAB — GLUCOSE, CAPILLARY

## 2012-12-19 LAB — GLUCOSE, CAPILLARY: Glucose-Capillary: 98 mg/dL (ref 70–99)

## 2012-12-21 LAB — GLUCOSE, CAPILLARY: Glucose-Capillary: 83 mg/dL (ref 70–99)

## 2012-12-22 LAB — GLUCOSE, CAPILLARY: Glucose-Capillary: 65 mg/dL — ABNORMAL LOW (ref 70–99)

## 2012-12-24 LAB — GLUCOSE, CAPILLARY: Glucose-Capillary: 92 mg/dL (ref 70–99)

## 2012-12-26 LAB — GLUCOSE, CAPILLARY: Glucose-Capillary: 82 mg/dL (ref 70–99)

## 2012-12-28 LAB — GLUCOSE, CAPILLARY: Glucose-Capillary: 96 mg/dL (ref 70–99)

## 2012-12-29 LAB — GLUCOSE, CAPILLARY: Glucose-Capillary: 88 mg/dL (ref 70–99)

## 2013-01-05 ENCOUNTER — Non-Acute Institutional Stay (SKILLED_NURSING_FACILITY): Payer: Medicare Other | Admitting: Internal Medicine

## 2013-01-05 DIAGNOSIS — I2510 Atherosclerotic heart disease of native coronary artery without angina pectoris: Secondary | ICD-10-CM

## 2013-01-05 DIAGNOSIS — E119 Type 2 diabetes mellitus without complications: Secondary | ICD-10-CM

## 2013-01-05 DIAGNOSIS — I10 Essential (primary) hypertension: Secondary | ICD-10-CM

## 2013-01-05 DIAGNOSIS — E785 Hyperlipidemia, unspecified: Secondary | ICD-10-CM

## 2013-01-05 DIAGNOSIS — N189 Chronic kidney disease, unspecified: Secondary | ICD-10-CM

## 2013-01-05 DIAGNOSIS — F068 Other specified mental disorders due to known physiological condition: Secondary | ICD-10-CM

## 2013-01-05 DIAGNOSIS — I6350 Cerebral infarction due to unspecified occlusion or stenosis of unspecified cerebral artery: Secondary | ICD-10-CM

## 2013-01-05 LAB — GLUCOSE, CAPILLARY
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
## 2013-01-06 LAB — GLUCOSE, CAPILLARY: Glucose-Capillary: 94 mg/dL (ref 70–99)

## 2013-01-07 LAB — GLUCOSE, CAPILLARY: Glucose-Capillary: 87 mg/dL (ref 70–99)

## 2013-01-09 LAB — GLUCOSE, CAPILLARY

## 2013-01-11 LAB — GLUCOSE, CAPILLARY

## 2013-01-12 LAB — GLUCOSE, CAPILLARY: Glucose-Capillary: 82 mg/dL (ref 70–99)

## 2013-01-13 LAB — GLUCOSE, CAPILLARY: Glucose-Capillary: 102 mg/dL — ABNORMAL HIGH (ref 70–99)

## 2013-01-16 LAB — GLUCOSE, CAPILLARY: Glucose-Capillary: 93 mg/dL (ref 70–99)

## 2013-01-19 LAB — GLUCOSE, CAPILLARY: Glucose-Capillary: 97 mg/dL (ref 70–99)

## 2013-01-20 LAB — GLUCOSE, CAPILLARY: Glucose-Capillary: 106 mg/dL — ABNORMAL HIGH (ref 70–99)

## 2013-01-22 LAB — GLUCOSE, CAPILLARY: Glucose-Capillary: 106 mg/dL — ABNORMAL HIGH (ref 70–99)

## 2013-01-24 LAB — GLUCOSE, CAPILLARY: Glucose-Capillary: 95 mg/dL (ref 70–99)

## 2013-01-26 LAB — GLUCOSE, CAPILLARY

## 2013-01-27 LAB — GLUCOSE, CAPILLARY: Glucose-Capillary: 89 mg/dL (ref 70–99)

## 2013-01-28 LAB — GLUCOSE, CAPILLARY: Glucose-Capillary: 96 mg/dL (ref 70–99)

## 2013-01-30 LAB — GLUCOSE, CAPILLARY: Glucose-Capillary: 94 mg/dL (ref 70–99)

## 2013-01-31 LAB — GLUCOSE, CAPILLARY

## 2013-02-01 LAB — GLUCOSE, CAPILLARY: Glucose-Capillary: 94 mg/dL (ref 70–99)

## 2013-02-02 LAB — GLUCOSE, CAPILLARY

## 2013-02-03 LAB — GLUCOSE, CAPILLARY: Glucose-Capillary: 110 mg/dL — ABNORMAL HIGH (ref 70–99)

## 2013-02-05 LAB — GLUCOSE, CAPILLARY: Glucose-Capillary: 94 mg/dL (ref 70–99)

## 2013-02-07 LAB — GLUCOSE, CAPILLARY: Glucose-Capillary: 91 mg/dL (ref 70–99)

## 2013-02-08 LAB — GLUCOSE, CAPILLARY: Glucose-Capillary: 97 mg/dL (ref 70–99)

## 2013-02-09 LAB — GLUCOSE, CAPILLARY: Glucose-Capillary: 89 mg/dL (ref 70–99)

## 2013-02-12 LAB — GLUCOSE, CAPILLARY: Glucose-Capillary: 88 mg/dL (ref 70–99)

## 2013-02-13 LAB — GLUCOSE, CAPILLARY

## 2013-02-14 LAB — GLUCOSE, CAPILLARY: Glucose-Capillary: 139 mg/dL — ABNORMAL HIGH (ref 70–99)

## 2013-02-18 LAB — GLUCOSE, CAPILLARY

## 2013-02-19 LAB — GLUCOSE, CAPILLARY: Glucose-Capillary: 118 mg/dL — ABNORMAL HIGH (ref 70–99)

## 2013-02-20 LAB — GLUCOSE, CAPILLARY: Glucose-Capillary: 123 mg/dL — ABNORMAL HIGH (ref 70–99)

## 2013-02-21 LAB — GLUCOSE, CAPILLARY: Glucose-Capillary: 89 mg/dL (ref 70–99)

## 2013-02-23 LAB — GLUCOSE, CAPILLARY: Glucose-Capillary: 103 mg/dL — ABNORMAL HIGH (ref 70–99)

## 2013-02-24 LAB — GLUCOSE, CAPILLARY: Glucose-Capillary: 99 mg/dL (ref 70–99)

## 2013-02-25 LAB — GLUCOSE, CAPILLARY: Glucose-Capillary: 86 mg/dL (ref 70–99)

## 2013-02-26 LAB — GLUCOSE, CAPILLARY

## 2013-02-28 LAB — GLUCOSE, CAPILLARY

## 2013-03-01 LAB — GLUCOSE, CAPILLARY: Glucose-Capillary: 78 mg/dL (ref 70–99)

## 2013-03-02 LAB — GLUCOSE, CAPILLARY: Glucose-Capillary: 89 mg/dL (ref 70–99)

## 2013-03-06 LAB — GLUCOSE, CAPILLARY: Glucose-Capillary: 91 mg/dL (ref 70–99)

## 2013-03-07 LAB — GLUCOSE, CAPILLARY: Glucose-Capillary: 111 mg/dL — ABNORMAL HIGH (ref 70–99)

## 2013-03-08 LAB — GLUCOSE, CAPILLARY: Glucose-Capillary: 96 mg/dL (ref 70–99)

## 2013-03-11 LAB — GLUCOSE, CAPILLARY

## 2013-03-12 LAB — GLUCOSE, CAPILLARY: Glucose-Capillary: 99 mg/dL (ref 70–99)

## 2013-03-13 LAB — GLUCOSE, CAPILLARY

## 2013-03-14 LAB — GLUCOSE, CAPILLARY: Glucose-Capillary: 96 mg/dL (ref 70–99)

## 2013-03-17 LAB — GLUCOSE, CAPILLARY

## 2013-03-18 LAB — GLUCOSE, CAPILLARY: Glucose-Capillary: 92 mg/dL (ref 70–99)

## 2013-03-19 LAB — GLUCOSE, CAPILLARY

## 2013-03-20 LAB — GLUCOSE, CAPILLARY

## 2013-03-23 LAB — GLUCOSE, CAPILLARY: Glucose-Capillary: 95 mg/dL (ref 70–99)

## 2013-03-24 LAB — GLUCOSE, CAPILLARY: Glucose-Capillary: 92 mg/dL (ref 70–99)

## 2013-03-25 LAB — GLUCOSE, CAPILLARY: Glucose-Capillary: 91 mg/dL (ref 70–99)

## 2013-03-27 LAB — GLUCOSE, CAPILLARY: Glucose-Capillary: 96 mg/dL (ref 70–99)

## 2013-03-29 LAB — GLUCOSE, CAPILLARY: Glucose-Capillary: 90 mg/dL (ref 70–99)

## 2013-03-30 LAB — GLUCOSE, CAPILLARY: Glucose-Capillary: 104 mg/dL — ABNORMAL HIGH (ref 70–99)

## 2013-03-31 LAB — GLUCOSE, CAPILLARY

## 2013-04-02 LAB — GLUCOSE, CAPILLARY

## 2013-04-04 LAB — GLUCOSE, CAPILLARY: Glucose-Capillary: 88 mg/dL (ref 70–99)

## 2013-04-05 LAB — GLUCOSE, CAPILLARY: Glucose-Capillary: 94 mg/dL (ref 70–99)

## 2013-04-07 LAB — GLUCOSE, CAPILLARY: Glucose-Capillary: 97 mg/dL (ref 70–99)

## 2013-04-09 LAB — GLUCOSE, CAPILLARY

## 2013-04-10 LAB — GLUCOSE, CAPILLARY: Glucose-Capillary: 80 mg/dL (ref 70–99)

## 2013-04-12 LAB — GLUCOSE, CAPILLARY: Glucose-Capillary: 94 mg/dL (ref 70–99)

## 2013-04-13 LAB — GLUCOSE, CAPILLARY: Glucose-Capillary: 85 mg/dL (ref 70–99)

## 2013-04-15 LAB — GLUCOSE, CAPILLARY: Glucose-Capillary: 99 mg/dL (ref 70–99)

## 2013-04-16 LAB — GLUCOSE, CAPILLARY: Glucose-Capillary: 95 mg/dL (ref 70–99)

## 2013-04-17 LAB — GLUCOSE, CAPILLARY: Glucose-Capillary: 82 mg/dL (ref 70–99)

## 2013-04-20 LAB — GLUCOSE, CAPILLARY: Glucose-Capillary: 93 mg/dL (ref 70–99)

## 2013-04-22 LAB — GLUCOSE, CAPILLARY: Glucose-Capillary: 106 mg/dL — ABNORMAL HIGH (ref 70–99)

## 2013-04-23 LAB — GLUCOSE, CAPILLARY: Glucose-Capillary: 97 mg/dL (ref 70–99)

## 2013-04-24 LAB — GLUCOSE, CAPILLARY: Glucose-Capillary: 88 mg/dL (ref 70–99)

## 2013-04-25 LAB — GLUCOSE, CAPILLARY: Glucose-Capillary: 85 mg/dL (ref 70–99)

## 2013-04-26 LAB — GLUCOSE, CAPILLARY: Glucose-Capillary: 107 mg/dL — ABNORMAL HIGH (ref 70–99)

## 2013-04-28 LAB — GLUCOSE, CAPILLARY: Glucose-Capillary: 81 mg/dL (ref 70–99)

## 2013-04-29 LAB — GLUCOSE, CAPILLARY: Glucose-Capillary: 86 mg/dL (ref 70–99)

## 2013-04-30 LAB — GLUCOSE, CAPILLARY: Glucose-Capillary: 93 mg/dL (ref 70–99)

## 2013-05-01 LAB — GLUCOSE, CAPILLARY: Glucose-Capillary: 94 mg/dL (ref 70–99)

## 2013-05-03 LAB — GLUCOSE, CAPILLARY: Glucose-Capillary: 109 mg/dL — ABNORMAL HIGH (ref 70–99)

## 2013-05-05 LAB — GLUCOSE, CAPILLARY
Glucose-Capillary: 83 mg/dL (ref 70–99)
Glucose-Capillary: 209 mg/dL — ABNORMAL HIGH (ref 70–99)

## 2013-05-06 LAB — GLUCOSE, CAPILLARY

## 2013-05-08 LAB — GLUCOSE, CAPILLARY

## 2013-05-10 LAB — GLUCOSE, CAPILLARY

## 2013-05-13 LAB — GLUCOSE, CAPILLARY: Glucose-Capillary: 106 mg/dL — ABNORMAL HIGH (ref 70–99)

## 2013-05-14 LAB — GLUCOSE, CAPILLARY: Glucose-Capillary: 98 mg/dL (ref 70–99)

## 2013-05-15 LAB — GLUCOSE, CAPILLARY: Glucose-Capillary: 94 mg/dL (ref 70–99)

## 2013-05-19 LAB — GLUCOSE, CAPILLARY: Glucose-Capillary: 81 mg/dL (ref 70–99)

## 2013-05-20 LAB — GLUCOSE, CAPILLARY

## 2013-05-21 LAB — GLUCOSE, CAPILLARY: Glucose-Capillary: 92 mg/dL (ref 70–99)

## 2013-05-24 LAB — GLUCOSE, CAPILLARY: Glucose-Capillary: 98 mg/dL (ref 70–99)

## 2013-05-25 ENCOUNTER — Non-Acute Institutional Stay (SKILLED_NURSING_FACILITY): Payer: Medicare Other | Admitting: Internal Medicine

## 2013-05-25 DIAGNOSIS — I10 Essential (primary) hypertension: Secondary | ICD-10-CM

## 2013-05-25 DIAGNOSIS — I6350 Cerebral infarction due to unspecified occlusion or stenosis of unspecified cerebral artery: Secondary | ICD-10-CM

## 2013-05-25 DIAGNOSIS — N189 Chronic kidney disease, unspecified: Secondary | ICD-10-CM

## 2013-05-25 DIAGNOSIS — F068 Other specified mental disorders due to known physiological condition: Secondary | ICD-10-CM

## 2013-05-25 DIAGNOSIS — E119 Type 2 diabetes mellitus without complications: Secondary | ICD-10-CM

## 2013-05-25 DIAGNOSIS — E785 Hyperlipidemia, unspecified: Secondary | ICD-10-CM
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
## 2013-05-26 LAB — GLUCOSE, CAPILLARY

## 2013-05-27 LAB — GLUCOSE, CAPILLARY: Glucose-Capillary: 108 mg/dL — ABNORMAL HIGH (ref 70–99)

## 2013-05-28 LAB — GLUCOSE, CAPILLARY: Glucose-Capillary: 100 mg/dL — ABNORMAL HIGH (ref 70–99)

## 2013-06-01 LAB — GLUCOSE, CAPILLARY: Glucose-Capillary: 77 mg/dL (ref 70–99)

## 2013-06-02 LAB — GLUCOSE, CAPILLARY: Glucose-Capillary: 85 mg/dL (ref 70–99)

## 2013-06-03 LAB — GLUCOSE, CAPILLARY: Glucose-Capillary: 90 mg/dL (ref 70–99)

## 2013-06-06 LAB — GLUCOSE, CAPILLARY
Glucose-Capillary: 88 mg/dL (ref 70–99)
Glucose-Capillary: 92 mg/dL (ref 70–99)

## 2013-06-08 LAB — GLUCOSE, CAPILLARY: Glucose-Capillary: 95 mg/dL (ref 70–99)

## 2013-06-10 LAB — GLUCOSE, CAPILLARY: Glucose-Capillary: 113 mg/dL — ABNORMAL HIGH (ref 70–99)

## 2013-06-11 LAB — GLUCOSE, CAPILLARY: Glucose-Capillary: 105 mg/dL — ABNORMAL HIGH (ref 70–99)

## 2013-06-12 LAB — GLUCOSE, CAPILLARY: Glucose-Capillary: 104 mg/dL — ABNORMAL HIGH (ref 70–99)

## 2013-06-13 LAB — GLUCOSE, CAPILLARY: Glucose-Capillary: 94 mg/dL (ref 70–99)

## 2013-06-15 LAB — GLUCOSE, CAPILLARY: Glucose-Capillary: 87 mg/dL (ref 70–99)

## 2013-06-16 LAB — GLUCOSE, CAPILLARY

## 2013-06-17 LAB — GLUCOSE, CAPILLARY: Glucose-Capillary: 103 mg/dL — ABNORMAL HIGH (ref 70–99)

## 2013-06-18 LAB — GLUCOSE, CAPILLARY: Glucose-Capillary: 87 mg/dL (ref 70–99)

## 2013-06-19 LAB — GLUCOSE, CAPILLARY: Glucose-Capillary: 106 mg/dL — ABNORMAL HIGH (ref 70–99)

## 2013-06-20 LAB — GLUCOSE, CAPILLARY: Glucose-Capillary: 100 mg/dL — ABNORMAL HIGH (ref 70–99)

## 2013-06-21 LAB — GLUCOSE, CAPILLARY: Glucose-Capillary: 90 mg/dL (ref 70–99)

## 2013-06-22 LAB — GLUCOSE, CAPILLARY
Glucose-Capillary: 256 mg/dL — ABNORMAL HIGH (ref 70–99)
Glucose-Capillary: 94 mg/dL (ref 70–99)

## 2013-06-23 LAB — GLUCOSE, CAPILLARY: Glucose-Capillary: 106 mg/dL — ABNORMAL HIGH (ref 70–99)

## 2013-06-24 LAB — GLUCOSE, CAPILLARY: Glucose-Capillary: 94 mg/dL (ref 70–99)

## 2013-06-25 LAB — GLUCOSE, CAPILLARY
Glucose-Capillary: 99 mg/dL (ref 70–99)
Glucose-Capillary: 104 mg/dL — ABNORMAL HIGH (ref 70–99)

## 2013-06-26 LAB — GLUCOSE, CAPILLARY: Glucose-Capillary: 82 mg/dL (ref 70–99)

## 2013-06-27 LAB — GLUCOSE, CAPILLARY: Glucose-Capillary: 93 mg/dL (ref 70–99)

## 2013-06-28 LAB — GLUCOSE, CAPILLARY: Glucose-Capillary: 84 mg/dL (ref 70–99)

## 2013-06-29 LAB — GLUCOSE, CAPILLARY: Glucose-Capillary: 86 mg/dL (ref 70–99)

## 2013-07-01 LAB — GLUCOSE, CAPILLARY

## 2013-07-02 LAB — GLUCOSE, CAPILLARY: Glucose-Capillary: 97 mg/dL (ref 70–99)

## 2013-07-03 LAB — GLUCOSE, CAPILLARY: Glucose-Capillary: 88 mg/dL (ref 70–99)

## 2013-07-04 LAB — GLUCOSE, CAPILLARY

## 2013-07-07 LAB — GLUCOSE, CAPILLARY

## 2013-07-08 LAB — GLUCOSE, CAPILLARY: Glucose-Capillary: 90 mg/dL (ref 70–99)

## 2013-07-09 LAB — GLUCOSE, CAPILLARY
Glucose-Capillary: 110 mg/dL — ABNORMAL HIGH (ref 70–99)
Glucose-Capillary: 161 mg/dL — ABNORMAL HIGH (ref 70–99)

## 2013-07-12 LAB — GLUCOSE, CAPILLARY

## 2013-07-13 LAB — GLUCOSE, CAPILLARY: Glucose-Capillary: 83 mg/dL (ref 70–99)

## 2013-07-14 LAB — GLUCOSE, CAPILLARY: Glucose-Capillary: 72 mg/dL (ref 70–99)

## 2013-07-16 LAB — GLUCOSE, CAPILLARY: Glucose-Capillary: 86 mg/dL (ref 70–99)

## 2013-07-17 LAB — GLUCOSE, CAPILLARY

## 2013-07-19 LAB — GLUCOSE, CAPILLARY: Glucose-Capillary: 78 mg/dL (ref 70–99)

## 2013-07-21 LAB — GLUCOSE, CAPILLARY: Glucose-Capillary: 89 mg/dL (ref 70–99)

## 2013-07-22 LAB — GLUCOSE, CAPILLARY

## 2013-07-24 LAB — GLUCOSE, CAPILLARY: Glucose-Capillary: 94 mg/dL (ref 70–99)

## 2013-07-25 LAB — GLUCOSE, CAPILLARY: Glucose-Capillary: 78 mg/dL (ref 70–99)

## 2013-07-26 LAB — GLUCOSE, CAPILLARY

## 2013-07-27 LAB — GLUCOSE, CAPILLARY: Glucose-Capillary: 104 mg/dL — ABNORMAL HIGH (ref 70–99)

## 2013-07-28 LAB — GLUCOSE, CAPILLARY: Glucose-Capillary: 84 mg/dL (ref 70–99)

## 2013-07-29 LAB — GLUCOSE, CAPILLARY

## 2013-07-30 LAB — GLUCOSE, CAPILLARY: Glucose-Capillary: 87 mg/dL (ref 70–99)

## 2013-08-03 LAB — GLUCOSE, CAPILLARY

## 2013-08-04 LAB — GLUCOSE, CAPILLARY: Glucose-Capillary: 101 mg/dL — ABNORMAL HIGH (ref 70–99)

## 2013-08-05 LAB — GLUCOSE, CAPILLARY

## 2013-08-06 LAB — GLUCOSE, CAPILLARY: Glucose-Capillary: 82 mg/dL (ref 70–99)

## 2013-08-09 LAB — GLUCOSE, CAPILLARY

## 2013-08-10 LAB — GLUCOSE, CAPILLARY: Glucose-Capillary: 90 mg/dL (ref 70–99)

## 2013-08-11 LAB — GLUCOSE, CAPILLARY: Glucose-Capillary: 90 mg/dL (ref 70–99)

## 2013-08-13 LAB — GLUCOSE, CAPILLARY

## 2013-08-15 LAB — GLUCOSE, CAPILLARY: Glucose-Capillary: 94 mg/dL (ref 70–99)

## 2013-08-16 LAB — GLUCOSE, CAPILLARY: Glucose-Capillary: 86 mg/dL (ref 70–99)

## 2013-08-17 LAB — GLUCOSE, CAPILLARY: Glucose-Capillary: 78 mg/dL (ref 70–99)

## 2013-08-18 LAB — GLUCOSE, CAPILLARY: Glucose-Capillary: 88 mg/dL (ref 70–99)

## 2013-08-19 LAB — GLUCOSE, CAPILLARY: Glucose-Capillary: 77 mg/dL (ref 70–99)

## 2013-08-20 LAB — GLUCOSE, CAPILLARY

## 2013-08-21 LAB — GLUCOSE, CAPILLARY: Glucose-Capillary: 104 mg/dL — ABNORMAL HIGH (ref 70–99)

## 2013-08-23 LAB — GLUCOSE, CAPILLARY: Glucose-Capillary: 96 mg/dL (ref 70–99)

## 2013-08-24 LAB — GLUCOSE, CAPILLARY

## 2013-08-25 LAB — GLUCOSE, CAPILLARY: Glucose-Capillary: 103 mg/dL — ABNORMAL HIGH (ref 70–99)

## 2013-08-26 LAB — GLUCOSE, CAPILLARY: Glucose-Capillary: 84 mg/dL (ref 70–99)

## 2013-08-27 LAB — GLUCOSE, CAPILLARY: Glucose-Capillary: 110 mg/dL — ABNORMAL HIGH (ref 70–99)

## 2013-08-28 LAB — GLUCOSE, CAPILLARY: Glucose-Capillary: 84 mg/dL (ref 70–99)

## 2013-09-01 LAB — GLUCOSE, CAPILLARY
Glucose-Capillary: 94 mg/dL (ref 70–99)
Glucose-Capillary: 95 mg/dL (ref 70–99)

## 2013-09-02 LAB — GLUCOSE, CAPILLARY: Glucose-Capillary: 105 mg/dL — ABNORMAL HIGH (ref 70–99)

## 2013-09-03 LAB — GLUCOSE, CAPILLARY: Glucose-Capillary: 103 mg/dL — ABNORMAL HIGH (ref 70–99)

## 2013-09-05 LAB — GLUCOSE, CAPILLARY: Glucose-Capillary: 92 mg/dL (ref 70–99)

## 2013-09-06 LAB — GLUCOSE, CAPILLARY: Glucose-Capillary: 90 mg/dL (ref 70–99)

## 2013-09-07 LAB — GLUCOSE, CAPILLARY: Glucose-Capillary: 87 mg/dL (ref 70–99)

## 2013-09-08 LAB — GLUCOSE, CAPILLARY: Glucose-Capillary: 99 mg/dL (ref 70–99)

## 2013-09-09 LAB — GLUCOSE, CAPILLARY: Glucose-Capillary: 88 mg/dL (ref 70–99)

## 2013-09-10 LAB — GLUCOSE, CAPILLARY: GLUCOSE-CAPILLARY: 91 mg/dL (ref 70–99)

## 2013-09-11 LAB — GLUCOSE, CAPILLARY: Glucose-Capillary: 95 mg/dL (ref 70–99)

## 2013-09-12 LAB — GLUCOSE, CAPILLARY: Glucose-Capillary: 101 mg/dL — ABNORMAL HIGH (ref 70–99)

## 2013-09-13 LAB — GLUCOSE, CAPILLARY: GLUCOSE-CAPILLARY: 110 mg/dL — AB (ref 70–99)

## 2013-09-14 LAB — GLUCOSE, CAPILLARY: Glucose-Capillary: 103 mg/dL — ABNORMAL HIGH (ref 70–99)

## 2013-09-15 LAB — GLUCOSE, CAPILLARY: Glucose-Capillary: 102 mg/dL — ABNORMAL HIGH (ref 70–99)

## 2013-09-16 LAB — GLUCOSE, CAPILLARY: Glucose-Capillary: 96 mg/dL (ref 70–99)

## 2013-09-17 LAB — GLUCOSE, CAPILLARY: Glucose-Capillary: 86 mg/dL (ref 70–99)

## 2013-09-18 LAB — GLUCOSE, CAPILLARY: GLUCOSE-CAPILLARY: 98 mg/dL (ref 70–99)

## 2013-09-19 LAB — GLUCOSE, CAPILLARY: Glucose-Capillary: 91 mg/dL (ref 70–99)

## 2013-09-20 LAB — GLUCOSE, CAPILLARY: GLUCOSE-CAPILLARY: 90 mg/dL (ref 70–99)

## 2013-09-21 LAB — GLUCOSE, CAPILLARY: GLUCOSE-CAPILLARY: 100 mg/dL — AB (ref 70–99)

## 2013-09-22 LAB — GLUCOSE, CAPILLARY: Glucose-Capillary: 92 mg/dL (ref 70–99)

## 2013-09-23 LAB — GLUCOSE, CAPILLARY: Glucose-Capillary: 90 mg/dL (ref 70–99)

## 2013-09-24 LAB — GLUCOSE, CAPILLARY: GLUCOSE-CAPILLARY: 96 mg/dL (ref 70–99)

## 2013-09-25 LAB — GLUCOSE, CAPILLARY: GLUCOSE-CAPILLARY: 82 mg/dL (ref 70–99)

## 2013-09-26 LAB — GLUCOSE, CAPILLARY: GLUCOSE-CAPILLARY: 93 mg/dL (ref 70–99)

## 2013-09-27 LAB — GLUCOSE, CAPILLARY: Glucose-Capillary: 96 mg/dL (ref 70–99)

## 2013-09-28 LAB — GLUCOSE, CAPILLARY: Glucose-Capillary: 91 mg/dL (ref 70–99)

## 2013-09-29 LAB — GLUCOSE, CAPILLARY: Glucose-Capillary: 87 mg/dL (ref 70–99)

## 2013-09-30 LAB — GLUCOSE, CAPILLARY: Glucose-Capillary: 90 mg/dL (ref 70–99)

## 2013-10-01 LAB — GLUCOSE, CAPILLARY: GLUCOSE-CAPILLARY: 76 mg/dL (ref 70–99)

## 2013-10-02 LAB — GLUCOSE, CAPILLARY: Glucose-Capillary: 80 mg/dL (ref 70–99)

## 2013-10-03 LAB — GLUCOSE, CAPILLARY: GLUCOSE-CAPILLARY: 83 mg/dL (ref 70–99)

## 2013-10-04 LAB — GLUCOSE, CAPILLARY: GLUCOSE-CAPILLARY: 85 mg/dL (ref 70–99)

## 2013-10-05 LAB — GLUCOSE, CAPILLARY: Glucose-Capillary: 96 mg/dL (ref 70–99)

## 2013-10-06 LAB — GLUCOSE, CAPILLARY: GLUCOSE-CAPILLARY: 92 mg/dL (ref 70–99)

## 2013-10-07 LAB — GLUCOSE, CAPILLARY: Glucose-Capillary: 100 mg/dL — ABNORMAL HIGH (ref 70–99)

## 2013-10-08 LAB — GLUCOSE, CAPILLARY: GLUCOSE-CAPILLARY: 96 mg/dL (ref 70–99)

## 2013-10-09 LAB — GLUCOSE, CAPILLARY: Glucose-Capillary: 93 mg/dL (ref 70–99)

## 2013-10-10 LAB — GLUCOSE, CAPILLARY: Glucose-Capillary: 86 mg/dL (ref 70–99)

## 2013-10-11 LAB — GLUCOSE, CAPILLARY: Glucose-Capillary: 92 mg/dL (ref 70–99)

## 2013-10-12 LAB — GLUCOSE, CAPILLARY: Glucose-Capillary: 96 mg/dL (ref 70–99)

## 2013-10-13 LAB — GLUCOSE, CAPILLARY: Glucose-Capillary: 93 mg/dL (ref 70–99)

## 2013-10-14 LAB — GLUCOSE, CAPILLARY
Glucose-Capillary: 89 mg/dL (ref 70–99)
Glucose-Capillary: 134 mg/dL — ABNORMAL HIGH (ref 70–99)

## 2013-10-15 LAB — GLUCOSE, CAPILLARY: Glucose-Capillary: 84 mg/dL (ref 70–99)

## 2013-10-16 LAB — GLUCOSE, CAPILLARY: GLUCOSE-CAPILLARY: 82 mg/dL (ref 70–99)

## 2013-10-17 LAB — GLUCOSE, CAPILLARY: GLUCOSE-CAPILLARY: 92 mg/dL (ref 70–99)

## 2013-10-18 LAB — GLUCOSE, CAPILLARY: GLUCOSE-CAPILLARY: 95 mg/dL (ref 70–99)

## 2013-10-19 LAB — GLUCOSE, CAPILLARY: Glucose-Capillary: 96 mg/dL (ref 70–99)

## 2013-10-20 LAB — GLUCOSE, CAPILLARY: GLUCOSE-CAPILLARY: 92 mg/dL (ref 70–99)

## 2013-10-21 LAB — GLUCOSE, CAPILLARY: Glucose-Capillary: 76 mg/dL (ref 70–99)

## 2013-10-22 LAB — GLUCOSE, CAPILLARY: GLUCOSE-CAPILLARY: 88 mg/dL (ref 70–99)

## 2013-10-23 LAB — GLUCOSE, CAPILLARY: GLUCOSE-CAPILLARY: 98 mg/dL (ref 70–99)

## 2013-10-24 LAB — GLUCOSE, CAPILLARY: GLUCOSE-CAPILLARY: 94 mg/dL (ref 70–99)

## 2013-10-25 LAB — GLUCOSE, CAPILLARY: GLUCOSE-CAPILLARY: 88 mg/dL (ref 70–99)

## 2013-10-26 LAB — GLUCOSE, CAPILLARY: Glucose-Capillary: 85 mg/dL (ref 70–99)

## 2013-10-27 LAB — GLUCOSE, CAPILLARY: GLUCOSE-CAPILLARY: 85 mg/dL (ref 70–99)

## 2013-10-28 LAB — GLUCOSE, CAPILLARY: Glucose-Capillary: 92 mg/dL (ref 70–99)

## 2013-10-29 LAB — GLUCOSE, CAPILLARY: GLUCOSE-CAPILLARY: 291 mg/dL — AB (ref 70–99)

## 2013-10-30 LAB — GLUCOSE, CAPILLARY: GLUCOSE-CAPILLARY: 96 mg/dL (ref 70–99)

## 2013-10-31 LAB — GLUCOSE, CAPILLARY: GLUCOSE-CAPILLARY: 87 mg/dL (ref 70–99)

## 2013-11-01 LAB — GLUCOSE, CAPILLARY: GLUCOSE-CAPILLARY: 97 mg/dL (ref 70–99)

## 2013-11-02 LAB — GLUCOSE, CAPILLARY: GLUCOSE-CAPILLARY: 88 mg/dL (ref 70–99)

## 2013-11-03 LAB — GLUCOSE, CAPILLARY
Glucose-Capillary: 99 mg/dL (ref 70–99)
Glucose-Capillary: 124 mg/dL — ABNORMAL HIGH (ref 70–99)
Glucose-Capillary: 154 mg/dL — ABNORMAL HIGH (ref 70–99)

## 2013-11-04 LAB — GLUCOSE, CAPILLARY
GLUCOSE-CAPILLARY: 88 mg/dL (ref 70–99)
GLUCOSE-CAPILLARY: 161 mg/dL — AB (ref 70–99)
GLUCOSE-CAPILLARY: 131 mg/dL — AB (ref 70–99)
Glucose-Capillary: 115 mg/dL — ABNORMAL HIGH (ref 70–99)

## 2013-11-05 LAB — GLUCOSE, CAPILLARY
Glucose-Capillary: 109 mg/dL — ABNORMAL HIGH (ref 70–99)
Glucose-Capillary: 265 mg/dL — ABNORMAL HIGH (ref 70–99)
Glucose-Capillary: 139 mg/dL — ABNORMAL HIGH (ref 70–99)
Glucose-Capillary: 118 mg/dL — ABNORMAL HIGH (ref 70–99)

## 2013-11-06 LAB — GLUCOSE, CAPILLARY
GLUCOSE-CAPILLARY: 210 mg/dL — AB (ref 70–99)
GLUCOSE-CAPILLARY: 208 mg/dL — AB (ref 70–99)
Glucose-Capillary: 119 mg/dL — ABNORMAL HIGH (ref 70–99)
Glucose-Capillary: 169 mg/dL — ABNORMAL HIGH (ref 70–99)

## 2013-11-07 LAB — GLUCOSE, CAPILLARY
Glucose-Capillary: 153 mg/dL — ABNORMAL HIGH (ref 70–99)
Glucose-Capillary: 186 mg/dL — ABNORMAL HIGH (ref 70–99)
Glucose-Capillary: 275 mg/dL — ABNORMAL HIGH (ref 70–99)

## 2013-11-08 LAB — GLUCOSE, CAPILLARY
GLUCOSE-CAPILLARY: 125 mg/dL — AB (ref 70–99)
Glucose-Capillary: 171 mg/dL — ABNORMAL HIGH (ref 70–99)
Glucose-Capillary: 183 mg/dL — ABNORMAL HIGH (ref 70–99)

## 2013-11-09 ENCOUNTER — Non-Acute Institutional Stay (SKILLED_NURSING_FACILITY): Payer: Medicare Other | Admitting: Internal Medicine

## 2013-11-09 DIAGNOSIS — N189 Chronic kidney disease, unspecified: Secondary | ICD-10-CM

## 2013-11-09 DIAGNOSIS — I6350 Cerebral infarction due to unspecified occlusion or stenosis of unspecified cerebral artery: Secondary | ICD-10-CM

## 2013-11-09 DIAGNOSIS — I2510 Atherosclerotic heart disease of native coronary artery without angina pectoris: Secondary | ICD-10-CM

## 2013-11-09 DIAGNOSIS — E785 Hyperlipidemia, unspecified: Secondary | ICD-10-CM

## 2013-11-09 DIAGNOSIS — F0390 Unspecified dementia without behavioral disturbance: Secondary | ICD-10-CM

## 2013-11-09 DIAGNOSIS — I10 Essential (primary) hypertension: Secondary | ICD-10-CM

## 2013-11-09 DIAGNOSIS — E119 Type 2 diabetes mellitus without complications: Secondary | ICD-10-CM

## 2013-11-09 LAB — GLUCOSE, CAPILLARY
GLUCOSE-CAPILLARY: 157 mg/dL — AB (ref 70–99)
GLUCOSE-CAPILLARY: 263 mg/dL — AB (ref 70–99)
Glucose-Capillary: 120 mg/dL — ABNORMAL HIGH (ref 70–99)
Glucose-Capillary: 218 mg/dL — ABNORMAL HIGH (ref 70–99)
Glucose-Capillary: 166 mg/dL — ABNORMAL HIGH (ref 70–99)
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
## 2013-11-10 LAB — GLUCOSE, CAPILLARY
GLUCOSE-CAPILLARY: 145 mg/dL — AB (ref 70–99)
Glucose-Capillary: 229 mg/dL — ABNORMAL HIGH (ref 70–99)
Glucose-Capillary: 124 mg/dL — ABNORMAL HIGH (ref 70–99)
Glucose-Capillary: 185 mg/dL — ABNORMAL HIGH (ref 70–99)

## 2013-11-11 ENCOUNTER — Non-Acute Institutional Stay (SKILLED_NURSING_FACILITY): Payer: Medicare Other | Admitting: Internal Medicine

## 2013-11-11 DIAGNOSIS — N189 Chronic kidney disease, unspecified: Secondary | ICD-10-CM

## 2013-11-11 DIAGNOSIS — I10 Essential (primary) hypertension: Secondary | ICD-10-CM

## 2013-11-11 LAB — GLUCOSE, CAPILLARY
GLUCOSE-CAPILLARY: 272 mg/dL — AB (ref 70–99)
Glucose-Capillary: 140 mg/dL — ABNORMAL HIGH (ref 70–99)
Glucose-Capillary: 196 mg/dL — ABNORMAL HIGH (ref 70–99)
Glucose-Capillary: 142 mg/dL — ABNORMAL HIGH (ref 70–99)

## 2013-11-11 NOTE — Progress Notes (Signed)
Patient ID: Nancy [name], female   DOB: [birth year], 80 y.o.   MRN: [id number]   \ This is an acute visit.  Level of care skilled.  Facility [facility].  Chief complaint asked to visit secondary to renal insufficiency-.  History of present illness.  Patient is a elderly long-term resident is facility with numerous medical issues including dementia as well as hypertension  as well as some renal insufficiency.  She has been quite stable I saw recently for routine visit and updated lab work does show her creatinine appears to be rising some now 1.6 with a BUN of 33 previous creatinines have been 1.2-1.4-I do see a 1.65 last year as well.  She is on hydrochlorothiazide with potassium supplementation-also on an ACE inhibitor.  Review of her blood pressures occasional somewhat low systolics most recently 280/03-KJZ 90/48-she is asymptomatic with this.  According to nursing staff she drinks their baseline has somewhat of a spotty appetite although her weight has been stable and he's been clinically stable for some time.  Family medical social history as been reviewed most recently progress note on 11/09/2013.  Medications have been reviewed per MAR.  Review of systems essentially unobtainable secondary to dementia please see history of present illness.  Physical exam.  She is afebrile pulse is 78 blood pressures as noted in history of present illness I do note at times normal systolics of 791 and 505 as well recently  In general this is a frail elderly female in no distress lying complete bed.  Her skin is warm and dry.  Heart is regular rate and rhythm without murmur gallop or rub.  did not really have any significant lower extremity edema   chest is clear to auscultation --with no labored breathing.  Neurologic-grossly intact no lateralizing findings .   Psych-she does have fairly significant dementia and is somewhat agitated with exam this evening. Labs.  11/10/2013.  WBC 4.9  hemoglobin 9.8 platelets 199.  Sodium 143 potassium 4.4 BUN 33 creatinine 1.6   Liver function tests within normal limits except albumin of 3.3.  Assessment and plan.  #1-renal insufficiency-she appears to be on the higher end creatinine wise of what she normally runs-r with her systolic  borderline low at times would question the need for hydrochlorothiazide we'll discontinue this and also will reduce the potassium somewhat to 20 mEq a day-update her metabolic panel in one week and also in 2 weeks to ensure stability of her renal function as well as electrolytes  Continue to monitor her blood pressures although I suspect this will be tolerated quite well.  [id number]

## 2013-11-12 LAB — GLUCOSE, CAPILLARY
GLUCOSE-CAPILLARY: 142 mg/dL — AB (ref 70–99)
GLUCOSE-CAPILLARY: 205 mg/dL — AB (ref 70–99)
Glucose-Capillary: 138 mg/dL — ABNORMAL HIGH (ref 70–99)
Glucose-Capillary: 162 mg/dL — ABNORMAL HIGH (ref 70–99)

## 2013-11-13 ENCOUNTER — Encounter: Payer: Self-pay | Admitting: Internal Medicine

## 2013-11-13 LAB — GLUCOSE, CAPILLARY
GLUCOSE-CAPILLARY: 258 mg/dL — AB (ref 70–99)
Glucose-Capillary: 115 mg/dL — ABNORMAL HIGH (ref 70–99)
Glucose-Capillary: 118 mg/dL — ABNORMAL HIGH (ref 70–99)
Glucose-Capillary: 141 mg/dL — ABNORMAL HIGH (ref 70–99)

## 2013-11-14 LAB — GLUCOSE, CAPILLARY
GLUCOSE-CAPILLARY: 137 mg/dL — AB (ref 70–99)
GLUCOSE-CAPILLARY: 147 mg/dL — AB (ref 70–99)
Glucose-Capillary: 250 mg/dL — ABNORMAL HIGH (ref 70–99)
Glucose-Capillary: 241 mg/dL — ABNORMAL HIGH (ref 70–99)

## 2013-11-15 LAB — GLUCOSE, CAPILLARY
Glucose-Capillary: 145 mg/dL — ABNORMAL HIGH (ref 70–99)
Glucose-Capillary: 202 mg/dL — ABNORMAL HIGH (ref 70–99)
Glucose-Capillary: 224 mg/dL — ABNORMAL HIGH (ref 70–99)

## 2013-11-16 LAB — GLUCOSE, CAPILLARY
GLUCOSE-CAPILLARY: 165 mg/dL — AB (ref 70–99)
GLUCOSE-CAPILLARY: 330 mg/dL — AB (ref 70–99)
GLUCOSE-CAPILLARY: 172 mg/dL — AB (ref 70–99)
Glucose-Capillary: 245 mg/dL — ABNORMAL HIGH (ref 70–99)
Glucose-Capillary: 187 mg/dL — ABNORMAL HIGH (ref 70–99)

## 2013-11-17 LAB — GLUCOSE, CAPILLARY
GLUCOSE-CAPILLARY: 152 mg/dL — AB (ref 70–99)
GLUCOSE-CAPILLARY: 227 mg/dL — AB (ref 70–99)
Glucose-Capillary: 400 mg/dL — ABNORMAL HIGH (ref 70–99)
Glucose-Capillary: 135 mg/dL — ABNORMAL HIGH (ref 70–99)

## 2013-11-18 LAB — GLUCOSE, CAPILLARY
GLUCOSE-CAPILLARY: 314 mg/dL — AB (ref 70–99)
GLUCOSE-CAPILLARY: 132 mg/dL — AB (ref 70–99)
Glucose-Capillary: 150 mg/dL — ABNORMAL HIGH (ref 70–99)
Glucose-Capillary: 158 mg/dL — ABNORMAL HIGH (ref 70–99)
Glucose-Capillary: 315 mg/dL — ABNORMAL HIGH (ref 70–99)
Glucose-Capillary: 280 mg/dL — ABNORMAL HIGH (ref 70–99)

## 2013-11-19 LAB — GLUCOSE, CAPILLARY
GLUCOSE-CAPILLARY: 207 mg/dL — AB (ref 70–99)
Glucose-Capillary: 171 mg/dL — ABNORMAL HIGH (ref 70–99)
Glucose-Capillary: 339 mg/dL — ABNORMAL HIGH (ref 70–99)
Glucose-Capillary: 177 mg/dL — ABNORMAL HIGH (ref 70–99)

## 2013-11-20 LAB — GLUCOSE, CAPILLARY
GLUCOSE-CAPILLARY: 185 mg/dL — AB (ref 70–99)
GLUCOSE-CAPILLARY: 231 mg/dL — AB (ref 70–99)
Glucose-Capillary: 142 mg/dL — ABNORMAL HIGH (ref 70–99)
Glucose-Capillary: 187 mg/dL — ABNORMAL HIGH (ref 70–99)

## 2013-11-21 LAB — GLUCOSE, CAPILLARY
Glucose-Capillary: 265 mg/dL — ABNORMAL HIGH (ref 70–99)
Glucose-Capillary: 153 mg/dL — ABNORMAL HIGH (ref 70–99)
Glucose-Capillary: 224 mg/dL — ABNORMAL HIGH (ref 70–99)
Glucose-Capillary: 387 mg/dL — ABNORMAL HIGH (ref 70–99)
Glucose-Capillary: 108 mg/dL — ABNORMAL HIGH (ref 70–99)

## 2013-11-22 LAB — GLUCOSE, CAPILLARY
GLUCOSE-CAPILLARY: 163 mg/dL — AB (ref 70–99)
GLUCOSE-CAPILLARY: 292 mg/dL — AB (ref 70–99)
Glucose-Capillary: 253 mg/dL — ABNORMAL HIGH (ref 70–99)
Glucose-Capillary: 200 mg/dL — ABNORMAL HIGH (ref 70–99)
Glucose-Capillary: 244 mg/dL — ABNORMAL HIGH (ref 70–99)

## 2013-11-23 LAB — GLUCOSE, CAPILLARY
GLUCOSE-CAPILLARY: 168 mg/dL — AB (ref 70–99)
GLUCOSE-CAPILLARY: 226 mg/dL — AB (ref 70–99)
Glucose-Capillary: 152 mg/dL — ABNORMAL HIGH (ref 70–99)
Glucose-Capillary: 315 mg/dL — ABNORMAL HIGH (ref 70–99)
Glucose-Capillary: 280 mg/dL — ABNORMAL HIGH (ref 70–99)

## 2013-11-24 LAB — GLUCOSE, CAPILLARY
GLUCOSE-CAPILLARY: 323 mg/dL — AB (ref 70–99)
GLUCOSE-CAPILLARY: 155 mg/dL — AB (ref 70–99)
Glucose-Capillary: 169 mg/dL — ABNORMAL HIGH (ref 70–99)
Glucose-Capillary: 260 mg/dL — ABNORMAL HIGH (ref 70–99)

## 2013-11-25 LAB — GLUCOSE, CAPILLARY
Glucose-Capillary: 160 mg/dL — ABNORMAL HIGH (ref 70–99)
Glucose-Capillary: 333 mg/dL — ABNORMAL HIGH (ref 70–99)
Glucose-Capillary: 183 mg/dL — ABNORMAL HIGH (ref 70–99)
Glucose-Capillary: 243 mg/dL — ABNORMAL HIGH (ref 70–99)

## 2013-11-26 LAB — GLUCOSE, CAPILLARY
GLUCOSE-CAPILLARY: 153 mg/dL — AB (ref 70–99)
GLUCOSE-CAPILLARY: 220 mg/dL — AB (ref 70–99)
Glucose-Capillary: 147 mg/dL — ABNORMAL HIGH (ref 70–99)

## 2013-11-27 LAB — GLUCOSE, CAPILLARY
GLUCOSE-CAPILLARY: 290 mg/dL — AB (ref 70–99)
Glucose-Capillary: 184 mg/dL — ABNORMAL HIGH (ref 70–99)
Glucose-Capillary: 178 mg/dL — ABNORMAL HIGH (ref 70–99)
Glucose-Capillary: 148 mg/dL — ABNORMAL HIGH (ref 70–99)
Glucose-Capillary: 120 mg/dL — ABNORMAL HIGH (ref 70–99)
Glucose-Capillary: 211 mg/dL — ABNORMAL HIGH (ref 70–99)

## 2013-11-28 LAB — GLUCOSE, CAPILLARY
GLUCOSE-CAPILLARY: 201 mg/dL — AB (ref 70–99)
Glucose-Capillary: 141 mg/dL — ABNORMAL HIGH (ref 70–99)
Glucose-Capillary: 133 mg/dL — ABNORMAL HIGH (ref 70–99)
Glucose-Capillary: 371 mg/dL — ABNORMAL HIGH (ref 70–99)
Glucose-Capillary: 247 mg/dL — ABNORMAL HIGH (ref 70–99)

## 2013-11-29 ENCOUNTER — Non-Acute Institutional Stay (SKILLED_NURSING_FACILITY): Payer: Medicare Other | Admitting: Internal Medicine

## 2013-11-29 DIAGNOSIS — E119 Type 2 diabetes mellitus without complications: Secondary | ICD-10-CM

## 2013-11-29 LAB — GLUCOSE, CAPILLARY
GLUCOSE-CAPILLARY: 280 mg/dL — AB (ref 70–99)
GLUCOSE-CAPILLARY: 325 mg/dL — AB (ref 70–99)
GLUCOSE-CAPILLARY: 217 mg/dL — AB (ref 70–99)

## 2013-11-30 LAB — GLUCOSE, CAPILLARY
Glucose-Capillary: 145 mg/dL — ABNORMAL HIGH (ref 70–99)
Glucose-Capillary: 145 mg/dL — ABNORMAL HIGH (ref 70–99)
Glucose-Capillary: 89 mg/dL (ref 70–99)

## 2013-12-01 LAB — GLUCOSE, CAPILLARY
Glucose-Capillary: 111 mg/dL — ABNORMAL HIGH (ref 70–99)
Glucose-Capillary: 213 mg/dL — ABNORMAL HIGH (ref 70–99)

## 2013-12-02 LAB — GLUCOSE, CAPILLARY
GLUCOSE-CAPILLARY: 141 mg/dL — AB (ref 70–99)
GLUCOSE-CAPILLARY: 242 mg/dL — AB (ref 70–99)
Glucose-Capillary: 237 mg/dL — ABNORMAL HIGH (ref 70–99)

## 2013-12-03 LAB — GLUCOSE, CAPILLARY
Glucose-Capillary: 117 mg/dL — ABNORMAL HIGH (ref 70–99)
Glucose-Capillary: 170 mg/dL — ABNORMAL HIGH (ref 70–99)

## 2013-12-04 LAB — GLUCOSE, CAPILLARY
Glucose-Capillary: 115 mg/dL — ABNORMAL HIGH (ref 70–99)
Glucose-Capillary: 245 mg/dL — ABNORMAL HIGH (ref 70–99)

## 2013-12-05 LAB — GLUCOSE, CAPILLARY
GLUCOSE-CAPILLARY: 130 mg/dL — AB (ref 70–99)
GLUCOSE-CAPILLARY: 256 mg/dL — AB (ref 70–99)

## 2013-12-06 LAB — GLUCOSE, CAPILLARY: GLUCOSE-CAPILLARY: 118 mg/dL — AB (ref 70–99)

## 2013-12-06 NOTE — Progress Notes (Addendum)
Patient ID: Nancy [name], female   DOB: 1925-06-16, 80 y.o.   MRN: [id number]                  PROGRESS NOTE  DATE:  11/29/2013    FACILITY: [facility]    LEVEL OF CARE:   SNF   Acute Visit   CHIEF COMPLAINT:  Diabetes.    HISTORY OF PRESENT ILLNESS:  This patient is a longstanding resident of this facility dating back years.    She has a history of type 2 diabetes, although I do not believe she has been on anything recently.  She was on Glucophage at one point.  During a follow-up earlier this month, her hemoglobin A1c was 7.3.    Recent lab work shows a BUN of 26, a creatinine of 1.43.    CBGs over the last several days have been provided.  She was started on a loose sliding scale insulin.  On 11/24/2013, her blood sugars were 169, 323, and 155.  Yesterday, they were 133, 371, and 247.      PHYSICAL EXAMINATION:  The patient has advanced dementia.  There are no obvious acute medical issues.    ASSESSMENT/PLAN:  Poorly controlled type 2 diabetes.  She is not a candidate for metformin due to renal insufficiency.  I am going to start her on a small dose of glyburide.  My goal here is not to provide tight diabetic control.  In fact, a hemoglobin A1c at 7.3 is not unsatisfactory.  I do not want to use sliding scale here.

## 2013-12-07 LAB — GLUCOSE, CAPILLARY: Glucose-Capillary: 113 mg/dL — ABNORMAL HIGH (ref 70–99)

## 2013-12-08 LAB — GLUCOSE, CAPILLARY
GLUCOSE-CAPILLARY: 164 mg/dL — AB (ref 70–99)
Glucose-Capillary: 104 mg/dL — ABNORMAL HIGH (ref 70–99)

## 2013-12-09 LAB — GLUCOSE, CAPILLARY
GLUCOSE-CAPILLARY: 267 mg/dL — AB (ref 70–99)
Glucose-Capillary: 235 mg/dL — ABNORMAL HIGH (ref 70–99)
Glucose-Capillary: 108 mg/dL — ABNORMAL HIGH (ref 70–99)

## 2013-12-10 LAB — GLUCOSE, CAPILLARY
GLUCOSE-CAPILLARY: 121 mg/dL — AB (ref 70–99)
Glucose-Capillary: 230 mg/dL — ABNORMAL HIGH (ref 70–99)

## 2013-12-11 LAB — GLUCOSE, CAPILLARY
GLUCOSE-CAPILLARY: 102 mg/dL — AB (ref 70–99)
Glucose-Capillary: 179 mg/dL — ABNORMAL HIGH (ref 70–99)

## 2013-12-12 LAB — GLUCOSE, CAPILLARY
Glucose-Capillary: 101 mg/dL — ABNORMAL HIGH (ref 70–99)
Glucose-Capillary: 173 mg/dL — ABNORMAL HIGH (ref 70–99)

## 2013-12-13 LAB — GLUCOSE, CAPILLARY
GLUCOSE-CAPILLARY: 107 mg/dL — AB (ref 70–99)
GLUCOSE-CAPILLARY: 184 mg/dL — AB (ref 70–99)

## 2013-12-14 LAB — GLUCOSE, CAPILLARY: GLUCOSE-CAPILLARY: 99 mg/dL (ref 70–99)

## 2013-12-15 LAB — GLUCOSE, CAPILLARY: Glucose-Capillary: 115 mg/dL — ABNORMAL HIGH (ref 70–99)

## 2013-12-16 LAB — GLUCOSE, CAPILLARY
GLUCOSE-CAPILLARY: 106 mg/dL — AB (ref 70–99)
GLUCOSE-CAPILLARY: 166 mg/dL — AB (ref 70–99)
Glucose-Capillary: 214 mg/dL — ABNORMAL HIGH (ref 70–99)

## 2013-12-17 LAB — GLUCOSE, CAPILLARY
GLUCOSE-CAPILLARY: 88 mg/dL (ref 70–99)
Glucose-Capillary: 161 mg/dL — ABNORMAL HIGH (ref 70–99)

## 2013-12-18 LAB — GLUCOSE, CAPILLARY: Glucose-Capillary: 94 mg/dL (ref 70–99)

## 2013-12-19 LAB — GLUCOSE, CAPILLARY
Glucose-Capillary: 282 mg/dL — ABNORMAL HIGH (ref 70–99)
Glucose-Capillary: 90 mg/dL (ref 70–99)
Glucose-Capillary: 174 mg/dL — ABNORMAL HIGH (ref 70–99)

## 2013-12-20 LAB — GLUCOSE, CAPILLARY
GLUCOSE-CAPILLARY: 100 mg/dL — AB (ref 70–99)
GLUCOSE-CAPILLARY: 170 mg/dL — AB (ref 70–99)

## 2013-12-21 LAB — GLUCOSE, CAPILLARY: Glucose-Capillary: 100 mg/dL — ABNORMAL HIGH (ref 70–99)

## 2013-12-22 LAB — GLUCOSE, CAPILLARY
GLUCOSE-CAPILLARY: 89 mg/dL (ref 70–99)
Glucose-Capillary: 108 mg/dL — ABNORMAL HIGH (ref 70–99)

## 2013-12-23 LAB — GLUCOSE, CAPILLARY
Glucose-Capillary: 187 mg/dL — ABNORMAL HIGH (ref 70–99)
Glucose-Capillary: 83 mg/dL (ref 70–99)

## 2013-12-24 LAB — GLUCOSE, CAPILLARY
GLUCOSE-CAPILLARY: 152 mg/dL — AB (ref 70–99)
GLUCOSE-CAPILLARY: 86 mg/dL (ref 70–99)

## 2013-12-25 LAB — GLUCOSE, CAPILLARY
Glucose-Capillary: 77 mg/dL (ref 70–99)
Glucose-Capillary: 192 mg/dL — ABNORMAL HIGH (ref 70–99)

## 2013-12-26 LAB — GLUCOSE, CAPILLARY
GLUCOSE-CAPILLARY: 143 mg/dL — AB (ref 70–99)
Glucose-Capillary: 251 mg/dL — ABNORMAL HIGH (ref 70–99)
Glucose-Capillary: 138 mg/dL — ABNORMAL HIGH (ref 70–99)

## 2013-12-27 LAB — GLUCOSE, CAPILLARY: Glucose-Capillary: 77 mg/dL (ref 70–99)

## 2013-12-28 LAB — GLUCOSE, CAPILLARY: Glucose-Capillary: 97 mg/dL (ref 70–99)

## 2013-12-29 LAB — GLUCOSE, CAPILLARY
GLUCOSE-CAPILLARY: 106 mg/dL — AB (ref 70–99)
GLUCOSE-CAPILLARY: 189 mg/dL — AB (ref 70–99)
Glucose-Capillary: 119 mg/dL — ABNORMAL HIGH (ref 70–99)
Glucose-Capillary: 87 mg/dL (ref 70–99)
Glucose-Capillary: 157 mg/dL — ABNORMAL HIGH (ref 70–99)

## 2013-12-30 LAB — GLUCOSE, CAPILLARY
GLUCOSE-CAPILLARY: 100 mg/dL — AB (ref 70–99)
GLUCOSE-CAPILLARY: 163 mg/dL — AB (ref 70–99)

## 2013-12-31 LAB — GLUCOSE, CAPILLARY
Glucose-Capillary: 114 mg/dL — ABNORMAL HIGH (ref 70–99)
Glucose-Capillary: 155 mg/dL — ABNORMAL HIGH (ref 70–99)

## 2014-01-01 LAB — GLUCOSE, CAPILLARY: Glucose-Capillary: 102 mg/dL — ABNORMAL HIGH (ref 70–99)

## 2014-01-02 LAB — GLUCOSE, CAPILLARY
GLUCOSE-CAPILLARY: 95 mg/dL (ref 70–99)
GLUCOSE-CAPILLARY: 234 mg/dL — AB (ref 70–99)

## 2014-01-03 LAB — GLUCOSE, CAPILLARY
GLUCOSE-CAPILLARY: 119 mg/dL — AB (ref 70–99)
Glucose-Capillary: 89 mg/dL (ref 70–99)

## 2014-01-04 LAB — GLUCOSE, CAPILLARY: Glucose-Capillary: 90 mg/dL (ref 70–99)

## 2014-01-05 LAB — GLUCOSE, CAPILLARY
GLUCOSE-CAPILLARY: 204 mg/dL — AB (ref 70–99)
Glucose-Capillary: 106 mg/dL — ABNORMAL HIGH (ref 70–99)

## 2014-01-06 LAB — GLUCOSE, CAPILLARY: GLUCOSE-CAPILLARY: 193 mg/dL — AB (ref 70–99)

## 2014-01-07 LAB — GLUCOSE, CAPILLARY
GLUCOSE-CAPILLARY: 110 mg/dL — AB (ref 70–99)
Glucose-Capillary: 102 mg/dL — ABNORMAL HIGH (ref 70–99)

## 2014-01-08 LAB — GLUCOSE, CAPILLARY: Glucose-Capillary: 176 mg/dL — ABNORMAL HIGH (ref 70–99)

## 2014-01-09 LAB — GLUCOSE, CAPILLARY
Glucose-Capillary: 97 mg/dL (ref 70–99)
Glucose-Capillary: 141 mg/dL — ABNORMAL HIGH (ref 70–99)

## 2014-01-10 LAB — GLUCOSE, CAPILLARY
GLUCOSE-CAPILLARY: 123 mg/dL — AB (ref 70–99)
GLUCOSE-CAPILLARY: 104 mg/dL — AB (ref 70–99)
Glucose-Capillary: 208 mg/dL — ABNORMAL HIGH (ref 70–99)
Glucose-Capillary: 259 mg/dL — ABNORMAL HIGH (ref 70–99)

## 2014-01-11 LAB — GLUCOSE, CAPILLARY
Glucose-Capillary: 110 mg/dL — ABNORMAL HIGH (ref 70–99)
Glucose-Capillary: 142 mg/dL — ABNORMAL HIGH (ref 70–99)

## 2014-01-12 LAB — GLUCOSE, CAPILLARY
GLUCOSE-CAPILLARY: 200 mg/dL — AB (ref 70–99)
Glucose-Capillary: 112 mg/dL — ABNORMAL HIGH (ref 70–99)

## 2014-01-13 LAB — GLUCOSE, CAPILLARY: Glucose-Capillary: 129 mg/dL — ABNORMAL HIGH (ref 70–99)

## 2014-01-14 LAB — GLUCOSE, CAPILLARY: GLUCOSE-CAPILLARY: 173 mg/dL — AB (ref 70–99)

## 2014-01-15 LAB — GLUCOSE, CAPILLARY
Glucose-Capillary: 110 mg/dL — ABNORMAL HIGH (ref 70–99)
Glucose-Capillary: 169 mg/dL — ABNORMAL HIGH (ref 70–99)

## 2014-01-16 LAB — GLUCOSE, CAPILLARY
GLUCOSE-CAPILLARY: 174 mg/dL — AB (ref 70–99)
Glucose-Capillary: 130 mg/dL — ABNORMAL HIGH (ref 70–99)
Glucose-Capillary: 110 mg/dL — ABNORMAL HIGH (ref 70–99)

## 2014-01-17 LAB — GLUCOSE, CAPILLARY
Glucose-Capillary: 109 mg/dL — ABNORMAL HIGH (ref 70–99)
Glucose-Capillary: 228 mg/dL — ABNORMAL HIGH (ref 70–99)

## 2014-01-18 LAB — GLUCOSE, CAPILLARY: Glucose-Capillary: 111 mg/dL — ABNORMAL HIGH (ref 70–99)

## 2014-01-19 LAB — GLUCOSE, CAPILLARY
GLUCOSE-CAPILLARY: 167 mg/dL — AB (ref 70–99)
GLUCOSE-CAPILLARY: 261 mg/dL — AB (ref 70–99)
Glucose-Capillary: 89 mg/dL (ref 70–99)

## 2014-01-20 LAB — GLUCOSE, CAPILLARY
GLUCOSE-CAPILLARY: 244 mg/dL — AB (ref 70–99)
Glucose-Capillary: 116 mg/dL — ABNORMAL HIGH (ref 70–99)

## 2014-01-21 LAB — GLUCOSE, CAPILLARY
Glucose-Capillary: 97 mg/dL (ref 70–99)
Glucose-Capillary: 164 mg/dL — ABNORMAL HIGH (ref 70–99)

## 2014-01-22 LAB — GLUCOSE, CAPILLARY
GLUCOSE-CAPILLARY: 145 mg/dL — AB (ref 70–99)
Glucose-Capillary: 81 mg/dL (ref 70–99)

## 2014-01-23 LAB — GLUCOSE, CAPILLARY
Glucose-Capillary: 98 mg/dL (ref 70–99)
Glucose-Capillary: 93 mg/dL (ref 70–99)

## 2014-01-24 LAB — GLUCOSE, CAPILLARY
GLUCOSE-CAPILLARY: 121 mg/dL — AB (ref 70–99)
Glucose-Capillary: 270 mg/dL — ABNORMAL HIGH (ref 70–99)

## 2014-01-25 LAB — GLUCOSE, CAPILLARY
GLUCOSE-CAPILLARY: 151 mg/dL — AB (ref 70–99)
Glucose-Capillary: 136 mg/dL — ABNORMAL HIGH (ref 70–99)

## 2014-01-26 LAB — GLUCOSE, CAPILLARY: Glucose-Capillary: 135 mg/dL — ABNORMAL HIGH (ref 70–99)

## 2014-01-27 ENCOUNTER — Encounter: Payer: Self-pay | Admitting: Gastroenterology

## 2014-01-27 ENCOUNTER — Telehealth: Payer: Self-pay | Admitting: Gastroenterology

## 2014-01-27 ENCOUNTER — Ambulatory Visit (INDEPENDENT_AMBULATORY_CARE_PROVIDER_SITE_OTHER): Payer: Medicare Other | Admitting: Gastroenterology

## 2014-01-27 VITALS — BP 151/68 | HR 56 | Temp 98.2°F | Ht 62.0 in | Wt 157.0 lb

## 2014-01-27 DIAGNOSIS — I2510 Atherosclerotic heart disease of native coronary artery without angina pectoris: Secondary | ICD-10-CM

## 2014-01-27 DIAGNOSIS — Z85038 Personal history of other malignant neoplasm of large intestine: Secondary | ICD-10-CM | POA: Insufficient documentation

## 2014-01-27 LAB — GLUCOSE, CAPILLARY
GLUCOSE-CAPILLARY: 112 mg/dL — AB (ref 70–99)
GLUCOSE-CAPILLARY: 165 mg/dL — AB (ref 70–99)

## 2014-01-27 NOTE — Assessment & Plan Note (Signed)
80 year old female with history of colon cancer in the remote past, with last colonoscopy in 2010 with tubular adenomas. Presenting today to discuss possible surveillance colonoscopy. Despite dementia, she is otherwise in fair health. Family member is present today, stating they would like for her to undergo another colonoscopy, as they would not "feel right" if something "was there". I feel she could tolerate this without problems, and this would likely be the last colonoscopy recommended for her due to age. As family is requesting a final surveillance colonoscopy, we will proceed in that direction.  Proceed with colonoscopy with Dr. Oneida Alar in the near future. The risks, benefits, and alternatives have been discussed in detail with the patient. They state understanding and desire to proceed.

## 2014-01-27 NOTE — Telephone Encounter (Signed)
Please make sure that the power of attorney is present on day of colonoscopy. They will need to sign the consent.

## 2014-01-27 NOTE — Patient Instructions (Signed)
We have scheduled you for a colonoscopy with Dr. Fields in the near future.  Further recommendations to follow!   
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
## 2014-01-28 ENCOUNTER — Encounter (HOSPITAL_COMMUNITY): Payer: Self-pay | Admitting: Pharmacy Technician

## 2014-01-28 LAB — GLUCOSE, CAPILLARY
Glucose-Capillary: 206 mg/dL — ABNORMAL HIGH (ref 70–99)
Glucose-Capillary: 134 mg/dL — ABNORMAL HIGH (ref 70–99)
Glucose-Capillary: 75 mg/dL (ref 70–99)
Glucose-Capillary: 202 mg/dL — ABNORMAL HIGH (ref 70–99)

## 2014-01-29 LAB — GLUCOSE, CAPILLARY
Glucose-Capillary: 126 mg/dL — ABNORMAL HIGH (ref 70–99)
Glucose-Capillary: 164 mg/dL — ABNORMAL HIGH (ref 70–99)

## 2014-01-30 LAB — GLUCOSE, CAPILLARY
GLUCOSE-CAPILLARY: 130 mg/dL — AB (ref 70–99)
Glucose-Capillary: 278 mg/dL — ABNORMAL HIGH (ref 70–99)

## 2014-01-31 LAB — GLUCOSE, CAPILLARY
Glucose-Capillary: 129 mg/dL — ABNORMAL HIGH (ref 70–99)
Glucose-Capillary: 148 mg/dL — ABNORMAL HIGH (ref 70–99)

## 2014-02-01 NOTE — Telephone Encounter (Signed)
I called Nancy [name] at work and [facility] for a return call. ( I did say that I needed to know if she will be with pt the day of her procedure on 02/14/2014 at 11:45 AM). Power of attornery must be with pt.

## 2014-02-02 LAB — GLUCOSE, CAPILLARY
GLUCOSE-CAPILLARY: 221 mg/dL — AB (ref 70–99)
GLUCOSE-CAPILLARY: 292 mg/dL — AB (ref 70–99)
Glucose-Capillary: 125 mg/dL — ABNORMAL HIGH (ref 70–99)
Glucose-Capillary: 130 mg/dL — ABNORMAL HIGH (ref 70–99)

## 2014-02-03 LAB — GLUCOSE, CAPILLARY
GLUCOSE-CAPILLARY: 157 mg/dL — AB (ref 70–99)
Glucose-Capillary: 121 mg/dL — ABNORMAL HIGH (ref 70–99)

## 2014-02-03 NOTE — Telephone Encounter (Signed)
I called and spoke to Caren Griffins and she said that she will make arrangements to be at the hospital for the pt's procedure to sign consent since she is the POA.

## 2014-02-04 ENCOUNTER — Encounter: Payer: Self-pay | Admitting: Internal Medicine

## 2014-02-04 ENCOUNTER — Non-Acute Institutional Stay (SKILLED_NURSING_FACILITY): Payer: Medicare Other | Admitting: Internal Medicine

## 2014-02-04 DIAGNOSIS — I6350 Cerebral infarction due to unspecified occlusion or stenosis of unspecified cerebral artery: Secondary | ICD-10-CM

## 2014-02-04 DIAGNOSIS — N189 Chronic kidney disease, unspecified: Secondary | ICD-10-CM

## 2014-02-04 DIAGNOSIS — K219 Gastro-esophageal reflux disease without esophagitis: Secondary | ICD-10-CM

## 2014-02-04 DIAGNOSIS — E119 Type 2 diabetes mellitus without complications: Secondary | ICD-10-CM

## 2014-02-04 DIAGNOSIS — E785 Hyperlipidemia, unspecified: Secondary | ICD-10-CM

## 2014-02-04 DIAGNOSIS — I2510 Atherosclerotic heart disease of native coronary artery without angina pectoris: Secondary | ICD-10-CM

## 2014-02-04 DIAGNOSIS — C189 Malignant neoplasm of colon, unspecified: Secondary | ICD-10-CM

## 2014-02-04 DIAGNOSIS — I10 Essential (primary) hypertension: Secondary | ICD-10-CM

## 2014-02-04 DIAGNOSIS — F068 Other specified mental disorders due to known physiological condition: Secondary | ICD-10-CM

## 2014-02-04 LAB — GLUCOSE, CAPILLARY
Glucose-Capillary: 118 mg/dL — ABNORMAL HIGH (ref 70–99)
Glucose-Capillary: 163 mg/dL — ABNORMAL HIGH (ref 70–99)
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
## 2014-02-05 LAB — GLUCOSE, CAPILLARY
Glucose-Capillary: 135 mg/dL — ABNORMAL HIGH (ref 70–99)
Glucose-Capillary: 172 mg/dL — ABNORMAL HIGH (ref 70–99)

## 2014-02-06 LAB — GLUCOSE, CAPILLARY: Glucose-Capillary: 104 mg/dL — ABNORMAL HIGH (ref 70–99)

## 2014-02-07 LAB — GLUCOSE, CAPILLARY
GLUCOSE-CAPILLARY: 185 mg/dL — AB (ref 70–99)
Glucose-Capillary: 106 mg/dL — ABNORMAL HIGH (ref 70–99)

## 2014-02-08 LAB — GLUCOSE, CAPILLARY
GLUCOSE-CAPILLARY: 120 mg/dL — AB (ref 70–99)
Glucose-Capillary: 170 mg/dL — ABNORMAL HIGH (ref 70–99)
Glucose-Capillary: 168 mg/dL — ABNORMAL HIGH (ref 70–99)

## 2014-02-09 LAB — GLUCOSE, CAPILLARY: Glucose-Capillary: 100 mg/dL — ABNORMAL HIGH (ref 70–99)

## 2014-02-10 LAB — GLUCOSE, CAPILLARY: Glucose-Capillary: 114 mg/dL — ABNORMAL HIGH (ref 70–99)

## 2014-02-11 LAB — GLUCOSE, CAPILLARY
GLUCOSE-CAPILLARY: 176 mg/dL — AB (ref 70–99)
GLUCOSE-CAPILLARY: 110 mg/dL — AB (ref 70–99)
Glucose-Capillary: 221 mg/dL — ABNORMAL HIGH (ref 70–99)
Glucose-Capillary: 213 mg/dL — ABNORMAL HIGH (ref 70–99)

## 2014-02-12 LAB — GLUCOSE, CAPILLARY: Glucose-Capillary: 108 mg/dL — ABNORMAL HIGH (ref 70–99)

## 2014-02-13 LAB — GLUCOSE, CAPILLARY
Glucose-Capillary: 94 mg/dL (ref 70–99)
Glucose-Capillary: 241 mg/dL — ABNORMAL HIGH (ref 70–99)

## 2014-02-14 ENCOUNTER — Ambulatory Visit (HOSPITAL_COMMUNITY)
Admission: RE | Admit: 2014-02-14 | Discharge: 2014-02-14 | Disposition: A | Discharge status: Skilled Nursing Facility | Payer: Medicare Other | Source: Ambulatory Visit | Attending: Gastroenterology | Admitting: Gastroenterology

## 2014-02-14 ENCOUNTER — Inpatient Hospital Stay
Admission: RE | Admit: 2014-02-14 | Discharge: 2016-10-10 | Disposition: A | Discharge status: Nursing Home (Includes ICF) | Payer: Federal, State, Local not specified - PPO | Source: Ambulatory Visit | Attending: Internal Medicine | Admitting: Internal Medicine

## 2014-02-14 ENCOUNTER — Encounter (HOSPITAL_COMMUNITY): Payer: Self-pay | Admitting: *Deleted

## 2014-02-14 ENCOUNTER — Encounter (HOSPITAL_COMMUNITY)
Admission: RE | Disposition: A | Discharge status: Skilled Nursing Facility | Payer: Self-pay | Source: Ambulatory Visit | Attending: Gastroenterology

## 2014-02-14 DIAGNOSIS — I2510 Atherosclerotic heart disease of native coronary artery without angina pectoris: Secondary | ICD-10-CM | POA: Insufficient documentation

## 2014-02-14 DIAGNOSIS — K5730 Diverticulosis of large intestine without perforation or abscess without bleeding: Secondary | ICD-10-CM | POA: Insufficient documentation

## 2014-02-14 DIAGNOSIS — Z860100 Personal history of colon polyps, unspecified: Secondary | ICD-10-CM | POA: Insufficient documentation

## 2014-02-14 DIAGNOSIS — D126 Benign neoplasm of colon, unspecified: Secondary | ICD-10-CM | POA: Insufficient documentation

## 2014-02-14 DIAGNOSIS — D649 Anemia, unspecified: Secondary | ICD-10-CM | POA: Insufficient documentation

## 2014-02-14 DIAGNOSIS — E039 Hypothyroidism, unspecified: Secondary | ICD-10-CM | POA: Insufficient documentation

## 2014-02-14 DIAGNOSIS — R05 Cough: Principal | ICD-10-CM

## 2014-02-14 DIAGNOSIS — Z7982 Long term (current) use of aspirin: Secondary | ICD-10-CM | POA: Insufficient documentation

## 2014-02-14 DIAGNOSIS — I10 Essential (primary) hypertension: Secondary | ICD-10-CM | POA: Insufficient documentation

## 2014-02-14 DIAGNOSIS — Z951 Presence of aortocoronary bypass graft: Secondary | ICD-10-CM | POA: Insufficient documentation

## 2014-02-14 DIAGNOSIS — F329 Major depressive disorder, single episode, unspecified: Secondary | ICD-10-CM | POA: Insufficient documentation

## 2014-02-14 DIAGNOSIS — F3289 Other specified depressive episodes: Secondary | ICD-10-CM | POA: Insufficient documentation

## 2014-02-14 DIAGNOSIS — E119 Type 2 diabetes mellitus without complications: Secondary | ICD-10-CM | POA: Insufficient documentation

## 2014-02-14 DIAGNOSIS — Z1211 Encounter for screening for malignant neoplasm of colon: Secondary | ICD-10-CM | POA: Insufficient documentation

## 2014-02-14 DIAGNOSIS — R059 Cough, unspecified: Principal | ICD-10-CM

## 2014-02-14 DIAGNOSIS — Z79899 Other long term (current) drug therapy: Secondary | ICD-10-CM | POA: Insufficient documentation

## 2014-02-14 DIAGNOSIS — Z85038 Personal history of other malignant neoplasm of large intestine: Secondary | ICD-10-CM | POA: Insufficient documentation

## 2014-02-14 HISTORY — PX: COLONOSCOPY: SHX5424

## 2014-02-14 LAB — GLUCOSE, CAPILLARY
GLUCOSE-CAPILLARY: 129 mg/dL — AB (ref 70–99)
Glucose-Capillary: 132 mg/dL — ABNORMAL HIGH (ref 70–99)

## 2014-02-14 SURGERY — COLONOSCOPY
Anesthesia: Moderate Sedation

## 2014-02-14 MED ORDER — MEPERIDINE HCL 100 MG/ML IJ SOLN
INTRAMUSCULAR | Status: DC | PRN
  Administered 2014-02-14: 25 mg

## 2014-02-14 MED ORDER — MEPERIDINE HCL 100 MG/ML IJ SOLN
INTRAMUSCULAR | Status: AC
  Filled 2014-02-14: qty 2

## 2014-02-14 MED ORDER — MIDAZOLAM HCL 5 MG/5ML IJ SOLN
INTRAMUSCULAR | Status: DC | PRN
  Administered 2014-02-14: 2 mg via INTRAVENOUS
  Administered 2014-02-14: 1 mg via INTRAVENOUS

## 2014-02-14 MED ORDER — MIDAZOLAM HCL 5 MG/5ML IJ SOLN
INTRAMUSCULAR | Status: AC
  Filled 2014-02-14: qty 10

## 2014-02-14 MED ORDER — SODIUM CHLORIDE 0.9 % IV SOLN
INTRAVENOUS | Status: DC
  Administered 2014-02-14: 12:00:00 via INTRAVENOUS

## 2014-02-14 NOTE — H&P (Signed)
Primary Care Physician:  Cyndee Brightly, MD Primary Gastroenterologist:  Dr. Oneida Alar  Pre-Procedure History & Physical: HPI:  Nancy [name] is a 80 y.o. female here for PERSONAL HISTORY OF POLYPS/COLON CANCER.  Past Medical History  Diagnosis Date  . Cardiac dysrhythmia, unspecified   . Syncope   . Hypothyroidism   . Hypertension   . Diabetes mellitus   . Anemia   . Depressive disorder   . Organic brain syndrome   . CAD (coronary atherosclerotic disease)   . Lung disease   . Colon cancer 1989    Past Surgical History  Procedure Laterality Date  . Colon surgery    . Coronary artery bypass graft    . Lacrimal tube insertion  05/14/2011    Procedure: LACRIMAL TUBE INSERTION;  Surgeon: Williams Che;  Location: [facility];  Service: Ophthalmology;  Laterality: Right;  Placement of Stent Right Lower Canaliculus  . Colonoscopy  12/27/2008    Dr. Hilma Favors 6-mm hepatic flexure polyps removed via snare cautery/ONE mm sessile hepatic flexure polyp removed/ONE sessile 6-mm transverse colon polyp removed/ONE sessile 3-mm transverse colon polyp removed/Rare diverticula seen/. The anastomosis is approximately 10 cm above the anal verge. Tubular adenomas.     Prior to Admission medications   Medication Sig Start Date End Date Taking? Authorizing Provider  aspirin 325 MG EC tablet Take 325 mg by mouth daily.     Yes Historical Provider, MD  donepezil (ARICEPT) 10 MG tablet Take 10 mg by mouth daily. Daily med   Yes Historical Provider, MD  ferrous sulfate 325 (65 FE) MG tablet Take 325 mg by mouth 2 (two) times daily.     Yes Historical Provider, MD  glipiZIDE (GLUCOTROL) 5 MG tablet Take 5 mg by mouth daily before breakfast.   Yes Historical Provider, MD  lisinopril (PRINIVIL,ZESTRIL) 2.5 MG tablet Take 2.5 mg by mouth daily.   Yes Historical Provider, MD  memantine (NAMENDA) 10 MG tablet Take 20 mg by mouth daily.    Yes Historical Provider, MD  simvastatin (ZOCOR) 40 MG tablet  Take 40 mg by mouth at bedtime.     Yes Historical Provider, MD    Allergies as of 01/27/2014  . (No Known Allergies)    Family History  Problem Relation Age of Onset  . Anesthesia problems Neg Hx   . Hypotension Neg Hx   . Malignant hyperthermia Neg Hx   . Pseudochol deficiency Neg Hx     History   Social History  . Marital Status: Single    Spouse Name: N/A    Number of Children: N/A  . Years of Education: N/A   Occupational History  . Not on file.   Social History Main Topics  . Smoking status: Never Smoker   . Smokeless tobacco: Never Used  . Alcohol Use: No  . Drug Use: No  . Sexual Activity:    Other Topics Concern  . Not on file   Social History Narrative  . No narrative on file    Review of Systems: See HPI, otherwise negative ROS   Physical Exam: BP 181/100  Pulse 76  Temp(Src) 98.3 F (36.8 C) (Oral)  Resp 18  Ht 5\' 2"  (1.575 m)  Wt 157 lb (71.215 kg)  BMI 28.71 kg/m2  SpO2 96% General:   Alert,  pleasant and cooperative in NAD Head:  Normocephalic and atraumatic. Neck:  Supple; Lungs:  Clear throughout to auscultation.    Heart:  Regular rate and rhythm. Abdomen:  Soft,  nontender and nondistended. Normal bowel sounds, without guarding, and without rebound.   Neurologic:  Alert, NO  NEW FOCAL DEFICITS  Impression/Plan:    PERSONAL HISTORY OF POLYPS/COLON CANCER.  PLAN:  1. TCS TODAY

## 2014-02-14 NOTE — Discharge Instructions (Signed)
You had 1 small polyp removed. You have  diverticulosis IN YOUR  LEFT COLON.   FOLLOW A HIGH FIBER DIET. AVOID ITEMS THAT CAUSE BLOATING. SEE INFO BELOW.  YOUR BIOPSY RESULTS SHOULD BE BACK IN 7 DAYS.  THE PT DOES NOT NEED FOR ROUTINE SCREENING COLONOSCOPY ANYMORE.   Colonoscopy Care After Read the instructions outlined below and refer to this sheet in the next week. These discharge instructions provide you with general information on caring for yourself after you leave the hospital. While your treatment has been planned according to the most current medical practices available, unavoidable complications occasionally occur. If you have any problems or questions after discharge, call DR. FIELDS, [phone].  ACTIVITY  You may resume your regular activity, but move at a slower pace for the next 24 hours.   Take frequent rest periods for the next 24 hours.   Walking will help get rid of the air and reduce the bloated feeling in your belly (abdomen).   No driving for 24 hours (because of the medicine (anesthesia) used during the test).   You may shower.   Do not sign any important legal documents or operate any machinery for 24 hours (because of the anesthesia used during the test).    NUTRITION  Drink plenty of fluids.   You may resume your normal diet as instructed by your doctor.   Begin with a light meal and progress to your normal diet. Heavy or fried foods are harder to digest and may make you feel sick to your stomach (nauseated).   Avoid alcoholic beverages for 24 hours or as instructed.    MEDICATIONS  You may resume your normal medications.   WHAT YOU CAN EXPECT TODAY  Some feelings of bloating in the abdomen.   Passage of more gas than usual.   Spotting of blood in your stool or on the toilet paper  .  IF YOU HAD POLYPS REMOVED DURING THE COLONOSCOPY:  Eat a soft diet IF YOU HAVE NAUSEA, BLOATING, ABDOMINAL PAIN, OR VOMITING.    FINDING OUT THE  RESULTS OF YOUR TEST Not all test results are available during your visit. DR. Oneida Alar WILL CALL YOU WITHIN 7 DAYS OF YOUR PROCEDUE WITH YOUR RESULTS. Do not assume everything is normal if you have not heard from DR. FIELDS IN ONE WEEK, CALL HER OFFICE AT [phone].  SEEK IMMEDIATE MEDICAL ATTENTION AND CALL THE OFFICE: [phone] IF:  You have more than a spotting of blood in your stool.   Your belly is swollen (abdominal distention).   You are nauseated or vomiting.   You have a temperature over 101F.   You have abdominal pain or discomfort that is severe or gets worse throughout the day.

## 2014-02-14 NOTE — Op Note (Signed)
[facility] [address] [place], [address]   COLONOSCOPY PROCEDURE REPORT  PATIENT: Nancy [name], Nancy [name]  MR#: [id number] BIRTHDATE: September 25, 1924 , 88  yrs. old GENDER: Female ENDOSCOPIST: Barney Drain, MD REFERRED IR:CVELFYB Dellia Nims, M.D. PROCEDURE DATE:  02/14/2014 PROCEDURE:   Colonoscopy with cold biopsy polypectomy INDICATIONS:Patient's personal history of adenomatous colon polyps and High risk patient with personal history of colon cancer. MEDICATIONS: Demerol 25 mg IV and Versed 2 mg IV  DESCRIPTION OF PROCEDURE:    Physical exam was performed.  Informed consent was obtained from the patient after explaining the benefits, risks, and alternatives to procedure.  The patient was connected to monitor and placed in left lateral position. Continuous oxygen was provided by nasal cannula and IV medicine administered through an indwelling cannula.  After administration of sedation and rectal exam, the patients rectum was intubated and the EC-[id number] ([id number])  colonoscope was advanced under direct visualization to the cecum.  The scope was removed slowly by carefully examining the color, texture, anatomy, and integrity mucosa on the way out.  The patient was recovered in endoscopy and discharged home in satisfactory condition.    COLON FINDINGS: A sessile polyp measuring 2 mm in size was found at the hepatic flexure.  A polypectomy was performed with cold forceps.  , Mild diverticulosis was noted in the transverse colon. , The mucosa appeared normal in the terminal ileum.  , and NORMAL ANASTOMOSIS AND RECTUM.  PREP QUALITY: excellent.CECAL W/D TIME: 8 minutes     COMPLICATIONS: None  ENDOSCOPIC IMPRESSION: 1.   ONE COLON POLYP REMOVED 2.   Mild diverticulosis in the transverse colon 3.   NORMAL ANASTOMOSIS   RECOMMENDATIONS: FOLLOW A HIGH FIBER DIET.  AVOID ITEMS THAT CAUSE BLOATING. BIOPSY RESULTS SHOULD BE BACK IN 7 DAYS. THE PT DOES NOT NEED ROUTINE  SCREENING COLONOSCOPY ANYMORE.       _______________________________ Lorrin MaisBarney Drain, MD 02/14/2014 2:51 PM

## 2014-02-14 NOTE — Progress Notes (Signed)
REVIEWED.  

## 2014-02-15 LAB — GLUCOSE, CAPILLARY
GLUCOSE-CAPILLARY: 97 mg/dL (ref 70–99)
GLUCOSE-CAPILLARY: 98 mg/dL (ref 70–99)
Glucose-Capillary: 228 mg/dL — ABNORMAL HIGH (ref 70–99)

## 2014-02-16 ENCOUNTER — Encounter (HOSPITAL_COMMUNITY): Payer: Self-pay | Admitting: Gastroenterology

## 2014-02-16 LAB — GLUCOSE, CAPILLARY
GLUCOSE-CAPILLARY: 92 mg/dL (ref 70–99)
GLUCOSE-CAPILLARY: 205 mg/dL — AB (ref 70–99)

## 2014-02-17 LAB — GLUCOSE, CAPILLARY
GLUCOSE-CAPILLARY: 166 mg/dL — AB (ref 70–99)
Glucose-Capillary: 98 mg/dL (ref 70–99)

## 2014-02-18 LAB — GLUCOSE, CAPILLARY
Glucose-Capillary: 108 mg/dL — ABNORMAL HIGH (ref 70–99)
Glucose-Capillary: 203 mg/dL — ABNORMAL HIGH (ref 70–99)

## 2014-02-19 LAB — GLUCOSE, CAPILLARY
Glucose-Capillary: 106 mg/dL — ABNORMAL HIGH (ref 70–99)
Glucose-Capillary: 138 mg/dL — ABNORMAL HIGH (ref 70–99)

## 2014-02-20 LAB — GLUCOSE, CAPILLARY: Glucose-Capillary: 129 mg/dL — ABNORMAL HIGH (ref 70–99)

## 2014-02-21 LAB — GLUCOSE, CAPILLARY: GLUCOSE-CAPILLARY: 105 mg/dL — AB (ref 70–99)

## 2014-02-22 LAB — GLUCOSE, CAPILLARY
GLUCOSE-CAPILLARY: 147 mg/dL — AB (ref 70–99)
Glucose-Capillary: 111 mg/dL — ABNORMAL HIGH (ref 70–99)

## 2014-02-23 LAB — GLUCOSE, CAPILLARY
GLUCOSE-CAPILLARY: 190 mg/dL — AB (ref 70–99)
Glucose-Capillary: 117 mg/dL — ABNORMAL HIGH (ref 70–99)
Glucose-Capillary: 189 mg/dL — ABNORMAL HIGH (ref 70–99)

## 2014-02-24 ENCOUNTER — Telehealth: Payer: Self-pay | Admitting: Gastroenterology

## 2014-02-24 LAB — GLUCOSE, CAPILLARY
GLUCOSE-CAPILLARY: 131 mg/dL — AB (ref 70–99)
Glucose-Capillary: 107 mg/dL — ABNORMAL HIGH (ref 70–99)

## 2014-02-24 NOTE — Telephone Encounter (Signed)
PLEASE CALL PT'S FACILITY. She had simple adenomas removed. FOLLOW A High fiber diet. NO NEED FOR CONTINUED ROUTINE SCREENING.

## 2014-02-25 LAB — GLUCOSE, CAPILLARY
Glucose-Capillary: 103 mg/dL — ABNORMAL HIGH (ref 70–99)
Glucose-Capillary: 111 mg/dL — ABNORMAL HIGH (ref 70–99)

## 2014-02-26 LAB — GLUCOSE, CAPILLARY
GLUCOSE-CAPILLARY: 96 mg/dL (ref 70–99)
Glucose-Capillary: 188 mg/dL — ABNORMAL HIGH (ref 70–99)

## 2014-02-27 LAB — GLUCOSE, CAPILLARY: Glucose-Capillary: 222 mg/dL — ABNORMAL HIGH (ref 70–99)

## 2014-02-28 LAB — GLUCOSE, CAPILLARY: GLUCOSE-CAPILLARY: 126 mg/dL — AB (ref 70–99)

## 2014-03-01 LAB — GLUCOSE, CAPILLARY
GLUCOSE-CAPILLARY: 109 mg/dL — AB (ref 70–99)
Glucose-Capillary: 126 mg/dL — ABNORMAL HIGH (ref 70–99)

## 2014-03-01 NOTE — Telephone Encounter (Signed)
Called and informed the nurse at the [facility], [place]. Also, faxed the info to her at [phone].

## 2014-03-02 LAB — GLUCOSE, CAPILLARY
GLUCOSE-CAPILLARY: 108 mg/dL — AB (ref 70–99)
Glucose-Capillary: 92 mg/dL (ref 70–99)
Glucose-Capillary: 218 mg/dL — ABNORMAL HIGH (ref 70–99)

## 2014-03-03 LAB — GLUCOSE, CAPILLARY
GLUCOSE-CAPILLARY: 110 mg/dL — AB (ref 70–99)
Glucose-Capillary: 191 mg/dL — ABNORMAL HIGH (ref 70–99)

## 2014-03-04 LAB — GLUCOSE, CAPILLARY
Glucose-Capillary: 92 mg/dL (ref 70–99)
Glucose-Capillary: 110 mg/dL — ABNORMAL HIGH (ref 70–99)

## 2014-03-05 LAB — GLUCOSE, CAPILLARY: Glucose-Capillary: 92 mg/dL (ref 70–99)

## 2014-03-06 LAB — GLUCOSE, CAPILLARY
GLUCOSE-CAPILLARY: 106 mg/dL — AB (ref 70–99)
GLUCOSE-CAPILLARY: 207 mg/dL — AB (ref 70–99)
Glucose-Capillary: 190 mg/dL — ABNORMAL HIGH (ref 70–99)

## 2014-03-07 LAB — GLUCOSE, CAPILLARY
GLUCOSE-CAPILLARY: 86 mg/dL (ref 70–99)
Glucose-Capillary: 164 mg/dL — ABNORMAL HIGH (ref 70–99)

## 2014-03-08 LAB — GLUCOSE, CAPILLARY: Glucose-Capillary: 107 mg/dL — ABNORMAL HIGH (ref 70–99)

## 2014-03-09 LAB — GLUCOSE, CAPILLARY: Glucose-Capillary: 102 mg/dL — ABNORMAL HIGH (ref 70–99)

## 2014-03-10 LAB — GLUCOSE, CAPILLARY
Glucose-Capillary: 178 mg/dL — ABNORMAL HIGH (ref 70–99)
Glucose-Capillary: 191 mg/dL — ABNORMAL HIGH (ref 70–99)
Glucose-Capillary: 105 mg/dL — ABNORMAL HIGH (ref 70–99)
Glucose-Capillary: 193 mg/dL — ABNORMAL HIGH (ref 70–99)

## 2014-03-11 LAB — GLUCOSE, CAPILLARY: Glucose-Capillary: 92 mg/dL (ref 70–99)

## 2014-03-12 LAB — GLUCOSE, CAPILLARY
Glucose-Capillary: 196 mg/dL — ABNORMAL HIGH (ref 70–99)
Glucose-Capillary: 88 mg/dL (ref 70–99)
Glucose-Capillary: 251 mg/dL — ABNORMAL HIGH (ref 70–99)

## 2014-03-13 LAB — GLUCOSE, CAPILLARY: Glucose-Capillary: 107 mg/dL — ABNORMAL HIGH (ref 70–99)

## 2014-03-14 LAB — GLUCOSE, CAPILLARY: Glucose-Capillary: 138 mg/dL — ABNORMAL HIGH (ref 70–99)

## 2014-03-15 LAB — GLUCOSE, CAPILLARY
GLUCOSE-CAPILLARY: 104 mg/dL — AB (ref 70–99)
GLUCOSE-CAPILLARY: 116 mg/dL — AB (ref 70–99)
Glucose-Capillary: 174 mg/dL — ABNORMAL HIGH (ref 70–99)
Glucose-Capillary: 98 mg/dL (ref 70–99)

## 2014-03-16 LAB — GLUCOSE, CAPILLARY: GLUCOSE-CAPILLARY: 105 mg/dL — AB (ref 70–99)

## 2014-03-17 LAB — GLUCOSE, CAPILLARY
GLUCOSE-CAPILLARY: 197 mg/dL — AB (ref 70–99)
Glucose-Capillary: 107 mg/dL — ABNORMAL HIGH (ref 70–99)
Glucose-Capillary: 156 mg/dL — ABNORMAL HIGH (ref 70–99)

## 2014-03-18 LAB — GLUCOSE, CAPILLARY: Glucose-Capillary: 98 mg/dL (ref 70–99)

## 2014-03-19 LAB — GLUCOSE, CAPILLARY
GLUCOSE-CAPILLARY: 183 mg/dL — AB (ref 70–99)
Glucose-Capillary: 88 mg/dL (ref 70–99)

## 2014-03-20 LAB — GLUCOSE, CAPILLARY
GLUCOSE-CAPILLARY: 107 mg/dL — AB (ref 70–99)
Glucose-Capillary: 212 mg/dL — ABNORMAL HIGH (ref 70–99)

## 2014-03-21 LAB — GLUCOSE, CAPILLARY
GLUCOSE-CAPILLARY: 106 mg/dL — AB (ref 70–99)
GLUCOSE-CAPILLARY: 190 mg/dL — AB (ref 70–99)
Glucose-Capillary: 166 mg/dL — ABNORMAL HIGH (ref 70–99)

## 2014-03-22 LAB — GLUCOSE, CAPILLARY: Glucose-Capillary: 91 mg/dL (ref 70–99)

## 2014-03-23 LAB — GLUCOSE, CAPILLARY
GLUCOSE-CAPILLARY: 126 mg/dL — AB (ref 70–99)
GLUCOSE-CAPILLARY: 96 mg/dL (ref 70–99)
Glucose-Capillary: 137 mg/dL — ABNORMAL HIGH (ref 70–99)

## 2014-03-24 LAB — GLUCOSE, CAPILLARY: GLUCOSE-CAPILLARY: 124 mg/dL — AB (ref 70–99)

## 2014-03-25 LAB — GLUCOSE, CAPILLARY
GLUCOSE-CAPILLARY: 196 mg/dL — AB (ref 70–99)
Glucose-Capillary: 98 mg/dL (ref 70–99)

## 2014-03-26 LAB — GLUCOSE, CAPILLARY
GLUCOSE-CAPILLARY: 178 mg/dL — AB (ref 70–99)
GLUCOSE-CAPILLARY: 85 mg/dL (ref 70–99)
GLUCOSE-CAPILLARY: 181 mg/dL — AB (ref 70–99)

## 2014-03-27 LAB — GLUCOSE, CAPILLARY
GLUCOSE-CAPILLARY: 96 mg/dL (ref 70–99)
Glucose-Capillary: 195 mg/dL — ABNORMAL HIGH (ref 70–99)

## 2014-03-28 LAB — GLUCOSE, CAPILLARY
GLUCOSE-CAPILLARY: 88 mg/dL (ref 70–99)
Glucose-Capillary: 175 mg/dL — ABNORMAL HIGH (ref 70–99)

## 2014-03-29 LAB — GLUCOSE, CAPILLARY
GLUCOSE-CAPILLARY: 103 mg/dL — AB (ref 70–99)
Glucose-Capillary: 164 mg/dL — ABNORMAL HIGH (ref 70–99)

## 2014-03-30 LAB — GLUCOSE, CAPILLARY
GLUCOSE-CAPILLARY: 82 mg/dL (ref 70–99)
Glucose-Capillary: 237 mg/dL — ABNORMAL HIGH (ref 70–99)

## 2014-03-31 LAB — GLUCOSE, CAPILLARY
GLUCOSE-CAPILLARY: 205 mg/dL — AB (ref 70–99)
Glucose-Capillary: 117 mg/dL — ABNORMAL HIGH (ref 70–99)

## 2014-04-01 LAB — GLUCOSE, CAPILLARY
GLUCOSE-CAPILLARY: 126 mg/dL — AB (ref 70–99)
Glucose-Capillary: 85 mg/dL (ref 70–99)

## 2014-04-02 LAB — GLUCOSE, CAPILLARY: GLUCOSE-CAPILLARY: 89 mg/dL (ref 70–99)

## 2014-04-03 LAB — GLUCOSE, CAPILLARY: Glucose-Capillary: 100 mg/dL — ABNORMAL HIGH (ref 70–99)

## 2014-04-04 LAB — GLUCOSE, CAPILLARY
Glucose-Capillary: 174 mg/dL — ABNORMAL HIGH (ref 70–99)
Glucose-Capillary: 172 mg/dL — ABNORMAL HIGH (ref 70–99)
Glucose-Capillary: 95 mg/dL (ref 70–99)

## 2014-04-05 LAB — GLUCOSE, CAPILLARY
GLUCOSE-CAPILLARY: 219 mg/dL — AB (ref 70–99)
GLUCOSE-CAPILLARY: 100 mg/dL — AB (ref 70–99)

## 2014-04-06 LAB — GLUCOSE, CAPILLARY
GLUCOSE-CAPILLARY: 110 mg/dL — AB (ref 70–99)
Glucose-Capillary: 125 mg/dL — ABNORMAL HIGH (ref 70–99)

## 2014-04-07 LAB — GLUCOSE, CAPILLARY
GLUCOSE-CAPILLARY: 223 mg/dL — AB (ref 70–99)
Glucose-Capillary: 140 mg/dL — ABNORMAL HIGH (ref 70–99)
Glucose-Capillary: 88 mg/dL (ref 70–99)

## 2014-04-08 LAB — GLUCOSE, CAPILLARY
GLUCOSE-CAPILLARY: 91 mg/dL (ref 70–99)
Glucose-Capillary: 136 mg/dL — ABNORMAL HIGH (ref 70–99)

## 2014-04-08 IMAGING — CR DG CHEST 1V
1 series · 1 of 1 positions shown · non-contrast
Comparison: CT chest 10/25/2008, 12/29/2007, 06/22/2007.  One-view
chest x-ray 08/07/2008.

CLINICAL DATA: Cough.  Chest congestion.

CHEST - 1 VIEW

[view not recorded]
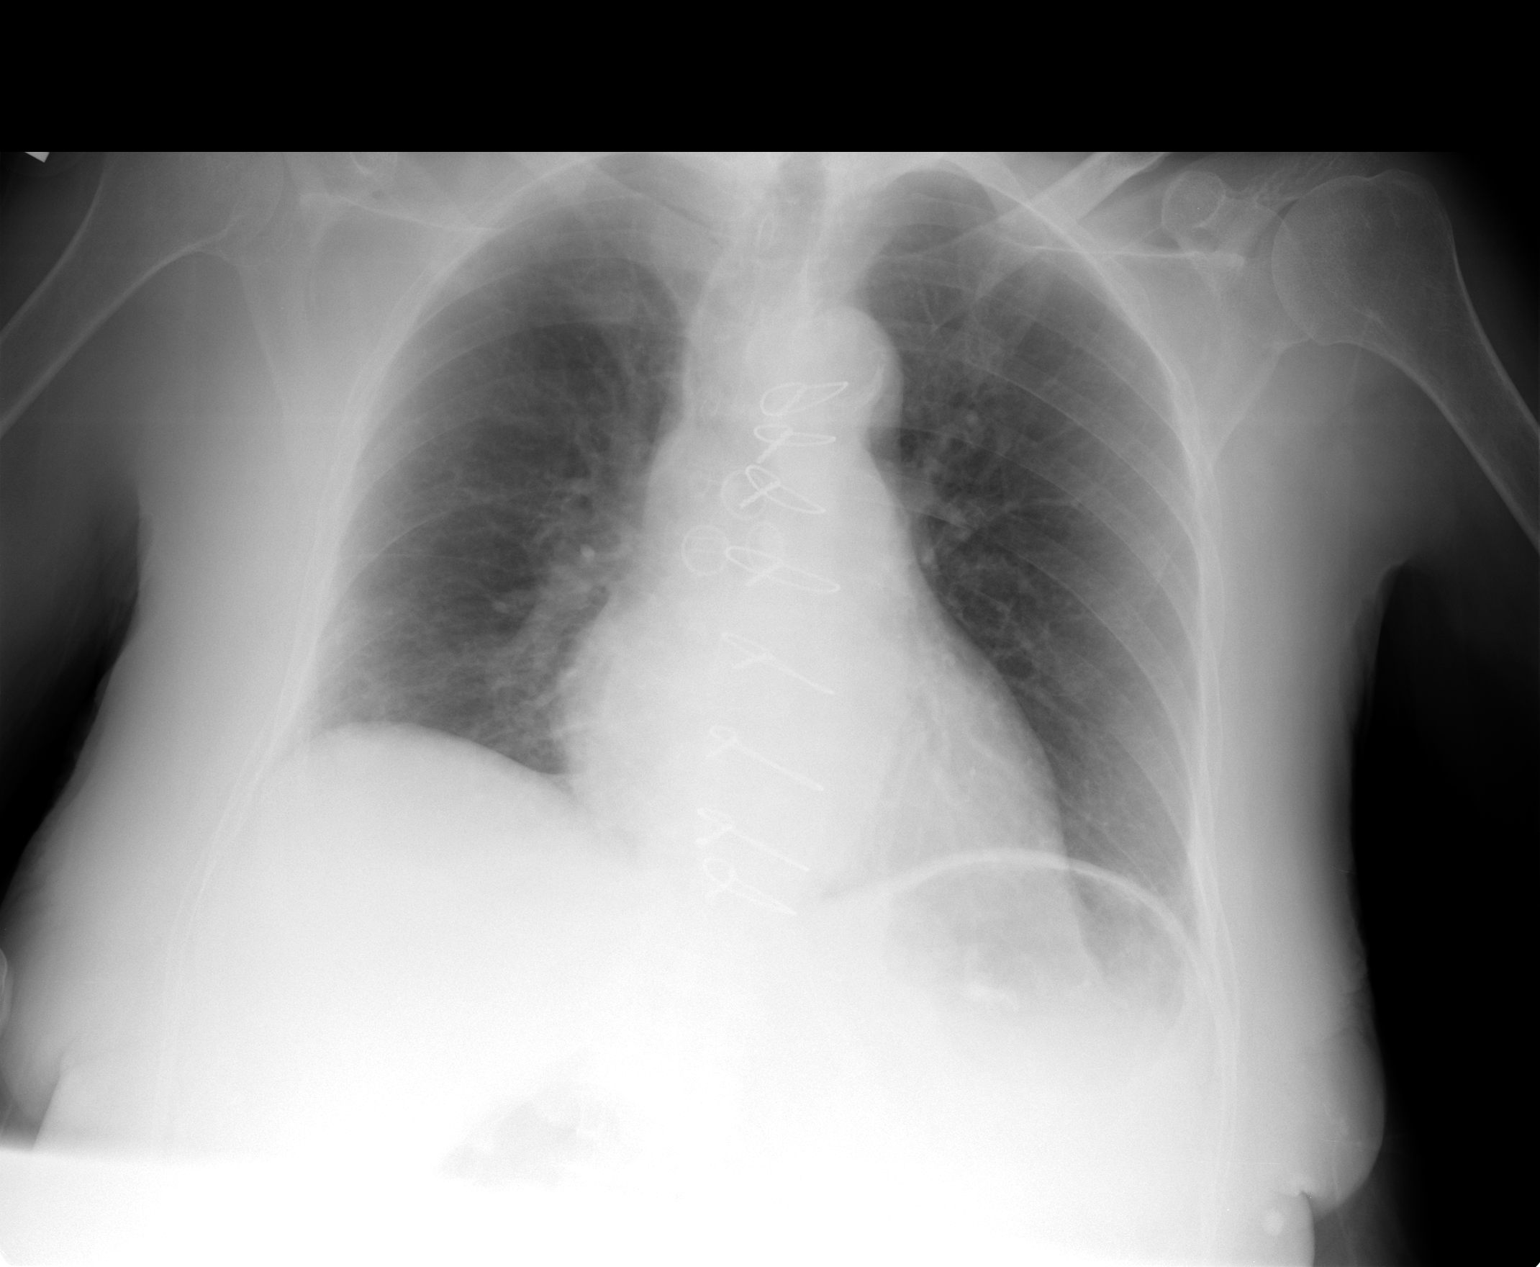

[1 of 1 positions shown; findings below may reference images not displayed]

FINDINGS: Prior sternotomy for CABG.  Cardiac silhouette mildly
enlarged but stable.  Lungs clear.  Pulmonary vascularity normal.
No visible pleural effusions.  Generalized osteopenia.
IMPRESSION: Stable mild cardiomegaly.  No acute cardiopulmonary disease.

## 2014-04-09 LAB — GLUCOSE, CAPILLARY
GLUCOSE-CAPILLARY: 270 mg/dL — AB (ref 70–99)
Glucose-Capillary: 81 mg/dL (ref 70–99)

## 2014-04-10 LAB — GLUCOSE, CAPILLARY
Glucose-Capillary: 88 mg/dL (ref 70–99)
Glucose-Capillary: 256 mg/dL — ABNORMAL HIGH (ref 70–99)

## 2014-04-11 LAB — GLUCOSE, CAPILLARY: GLUCOSE-CAPILLARY: 100 mg/dL — AB (ref 70–99)

## 2014-04-12 LAB — GLUCOSE, CAPILLARY
GLUCOSE-CAPILLARY: 105 mg/dL — AB (ref 70–99)
GLUCOSE-CAPILLARY: 113 mg/dL — AB (ref 70–99)
Glucose-Capillary: 218 mg/dL — ABNORMAL HIGH (ref 70–99)

## 2014-04-13 LAB — GLUCOSE, CAPILLARY: Glucose-Capillary: 86 mg/dL (ref 70–99)

## 2014-04-14 LAB — GLUCOSE, CAPILLARY
GLUCOSE-CAPILLARY: 241 mg/dL — AB (ref 70–99)
Glucose-Capillary: 135 mg/dL — ABNORMAL HIGH (ref 70–99)
Glucose-Capillary: 129 mg/dL — ABNORMAL HIGH (ref 70–99)

## 2014-04-15 LAB — GLUCOSE, CAPILLARY
GLUCOSE-CAPILLARY: 87 mg/dL (ref 70–99)
Glucose-Capillary: 224 mg/dL — ABNORMAL HIGH (ref 70–99)

## 2014-04-16 LAB — GLUCOSE, CAPILLARY: Glucose-Capillary: 96 mg/dL (ref 70–99)

## 2014-04-17 LAB — GLUCOSE, CAPILLARY
GLUCOSE-CAPILLARY: 118 mg/dL — AB (ref 70–99)
GLUCOSE-CAPILLARY: 139 mg/dL — AB (ref 70–99)
Glucose-Capillary: 82 mg/dL (ref 70–99)

## 2014-04-18 LAB — GLUCOSE, CAPILLARY
GLUCOSE-CAPILLARY: 91 mg/dL (ref 70–99)
GLUCOSE-CAPILLARY: 139 mg/dL — AB (ref 70–99)

## 2014-04-19 LAB — GLUCOSE, CAPILLARY: GLUCOSE-CAPILLARY: 92 mg/dL (ref 70–99)

## 2014-04-20 LAB — GLUCOSE, CAPILLARY
Glucose-Capillary: 160 mg/dL — ABNORMAL HIGH (ref 70–99)
Glucose-Capillary: 101 mg/dL — ABNORMAL HIGH (ref 70–99)
Glucose-Capillary: 141 mg/dL — ABNORMAL HIGH (ref 70–99)

## 2014-04-21 LAB — GLUCOSE, CAPILLARY: Glucose-Capillary: 125 mg/dL — ABNORMAL HIGH (ref 70–99)

## 2014-04-22 LAB — GLUCOSE, CAPILLARY
GLUCOSE-CAPILLARY: 183 mg/dL — AB (ref 70–99)
GLUCOSE-CAPILLARY: 90 mg/dL (ref 70–99)
Glucose-Capillary: 185 mg/dL — ABNORMAL HIGH (ref 70–99)

## 2014-04-23 LAB — GLUCOSE, CAPILLARY
GLUCOSE-CAPILLARY: 87 mg/dL (ref 70–99)
Glucose-Capillary: 196 mg/dL — ABNORMAL HIGH (ref 70–99)

## 2014-04-25 LAB — GLUCOSE, CAPILLARY
Glucose-Capillary: 183 mg/dL — ABNORMAL HIGH (ref 70–99)
Glucose-Capillary: 110 mg/dL — ABNORMAL HIGH (ref 70–99)
Glucose-Capillary: 121 mg/dL — ABNORMAL HIGH (ref 70–99)

## 2014-04-26 LAB — GLUCOSE, CAPILLARY
GLUCOSE-CAPILLARY: 79 mg/dL (ref 70–99)
GLUCOSE-CAPILLARY: 155 mg/dL — AB (ref 70–99)

## 2014-04-27 LAB — GLUCOSE, CAPILLARY: Glucose-Capillary: 116 mg/dL — ABNORMAL HIGH (ref 70–99)

## 2014-04-28 LAB — GLUCOSE, CAPILLARY
GLUCOSE-CAPILLARY: 136 mg/dL — AB (ref 70–99)
GLUCOSE-CAPILLARY: 100 mg/dL — AB (ref 70–99)
Glucose-Capillary: 173 mg/dL — ABNORMAL HIGH (ref 70–99)

## 2014-04-29 LAB — GLUCOSE, CAPILLARY
Glucose-Capillary: 108 mg/dL — ABNORMAL HIGH (ref 70–99)
Glucose-Capillary: 211 mg/dL — ABNORMAL HIGH (ref 70–99)

## 2014-04-30 LAB — GLUCOSE, CAPILLARY
Glucose-Capillary: 103 mg/dL — ABNORMAL HIGH (ref 70–99)
Glucose-Capillary: 165 mg/dL — ABNORMAL HIGH (ref 70–99)

## 2014-05-01 LAB — GLUCOSE, CAPILLARY
GLUCOSE-CAPILLARY: 100 mg/dL — AB (ref 70–99)
GLUCOSE-CAPILLARY: 101 mg/dL — AB (ref 70–99)

## 2014-05-02 LAB — GLUCOSE, CAPILLARY: Glucose-Capillary: 96 mg/dL (ref 70–99)

## 2014-05-03 LAB — GLUCOSE, CAPILLARY
GLUCOSE-CAPILLARY: 152 mg/dL — AB (ref 70–99)
Glucose-Capillary: 117 mg/dL — ABNORMAL HIGH (ref 70–99)

## 2014-05-04 ENCOUNTER — Non-Acute Institutional Stay (SKILLED_NURSING_FACILITY): Payer: Medicare Other | Admitting: Internal Medicine

## 2014-05-04 DIAGNOSIS — I6350 Cerebral infarction due to unspecified occlusion or stenosis of unspecified cerebral artery: Secondary | ICD-10-CM

## 2014-05-04 DIAGNOSIS — E1129 Type 2 diabetes mellitus with other diabetic kidney complication: Secondary | ICD-10-CM

## 2014-05-04 DIAGNOSIS — E1122 Type 2 diabetes mellitus with diabetic chronic kidney disease: Secondary | ICD-10-CM

## 2014-05-04 DIAGNOSIS — N189 Chronic kidney disease, unspecified: Secondary | ICD-10-CM

## 2014-05-04 DIAGNOSIS — I2510 Atherosclerotic heart disease of native coronary artery without angina pectoris: Secondary | ICD-10-CM

## 2014-05-04 DIAGNOSIS — F068 Other specified mental disorders due to known physiological condition: Secondary | ICD-10-CM

## 2014-05-04 DIAGNOSIS — I10 Essential (primary) hypertension: Secondary | ICD-10-CM

## 2014-05-04 DIAGNOSIS — K219 Gastro-esophageal reflux disease without esophagitis: Secondary | ICD-10-CM

## 2014-05-04 LAB — GLUCOSE, CAPILLARY
GLUCOSE-CAPILLARY: 202 mg/dL — AB (ref 70–99)
GLUCOSE-CAPILLARY: 105 mg/dL — AB (ref 70–99)
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
## 2014-05-05 LAB — GLUCOSE, CAPILLARY
Glucose-Capillary: 155 mg/dL — ABNORMAL HIGH (ref 70–99)
Glucose-Capillary: 97 mg/dL (ref 70–99)

## 2014-05-06 LAB — GLUCOSE, CAPILLARY
Glucose-Capillary: 104 mg/dL — ABNORMAL HIGH (ref 70–99)
Glucose-Capillary: 222 mg/dL — ABNORMAL HIGH (ref 70–99)

## 2014-05-07 LAB — GLUCOSE, CAPILLARY
GLUCOSE-CAPILLARY: 90 mg/dL (ref 70–99)
Glucose-Capillary: 152 mg/dL — ABNORMAL HIGH (ref 70–99)

## 2014-05-08 LAB — GLUCOSE, CAPILLARY
Glucose-Capillary: 96 mg/dL (ref 70–99)
Glucose-Capillary: 218 mg/dL — ABNORMAL HIGH (ref 70–99)

## 2014-05-09 LAB — GLUCOSE, CAPILLARY
Glucose-Capillary: 107 mg/dL — ABNORMAL HIGH (ref 70–99)
Glucose-Capillary: 222 mg/dL — ABNORMAL HIGH (ref 70–99)

## 2014-05-10 LAB — GLUCOSE, CAPILLARY
GLUCOSE-CAPILLARY: 103 mg/dL — AB (ref 70–99)
Glucose-Capillary: 146 mg/dL — ABNORMAL HIGH (ref 70–99)

## 2014-05-11 LAB — GLUCOSE, CAPILLARY
Glucose-Capillary: 121 mg/dL — ABNORMAL HIGH (ref 70–99)
Glucose-Capillary: 119 mg/dL — ABNORMAL HIGH (ref 70–99)

## 2014-05-12 LAB — GLUCOSE, CAPILLARY
GLUCOSE-CAPILLARY: 109 mg/dL — AB (ref 70–99)
Glucose-Capillary: 287 mg/dL — ABNORMAL HIGH (ref 70–99)
Glucose-Capillary: 117 mg/dL — ABNORMAL HIGH (ref 70–99)

## 2014-05-13 LAB — GLUCOSE, CAPILLARY: Glucose-Capillary: 149 mg/dL — ABNORMAL HIGH (ref 70–99)

## 2014-05-14 LAB — GLUCOSE, CAPILLARY
GLUCOSE-CAPILLARY: 114 mg/dL — AB (ref 70–99)
Glucose-Capillary: 207 mg/dL — ABNORMAL HIGH (ref 70–99)

## 2014-05-15 LAB — GLUCOSE, CAPILLARY: GLUCOSE-CAPILLARY: 108 mg/dL — AB (ref 70–99)

## 2014-05-16 LAB — GLUCOSE, CAPILLARY
GLUCOSE-CAPILLARY: 103 mg/dL — AB (ref 70–99)
Glucose-Capillary: 140 mg/dL — ABNORMAL HIGH (ref 70–99)
Glucose-Capillary: 187 mg/dL — ABNORMAL HIGH (ref 70–99)

## 2014-05-17 LAB — GLUCOSE, CAPILLARY: GLUCOSE-CAPILLARY: 97 mg/dL (ref 70–99)

## 2014-05-18 LAB — GLUCOSE, CAPILLARY
GLUCOSE-CAPILLARY: 113 mg/dL — AB (ref 70–99)
Glucose-Capillary: 216 mg/dL — ABNORMAL HIGH (ref 70–99)

## 2014-05-19 LAB — GLUCOSE, CAPILLARY
GLUCOSE-CAPILLARY: 168 mg/dL — AB (ref 70–99)
Glucose-Capillary: 163 mg/dL — ABNORMAL HIGH (ref 70–99)
Glucose-Capillary: 106 mg/dL — ABNORMAL HIGH (ref 70–99)

## 2014-05-20 LAB — GLUCOSE, CAPILLARY: Glucose-Capillary: 107 mg/dL — ABNORMAL HIGH (ref 70–99)

## 2014-05-21 LAB — GLUCOSE, CAPILLARY
Glucose-Capillary: 213 mg/dL — ABNORMAL HIGH (ref 70–99)
Glucose-Capillary: 101 mg/dL — ABNORMAL HIGH (ref 70–99)

## 2014-05-22 LAB — GLUCOSE, CAPILLARY
GLUCOSE-CAPILLARY: 193 mg/dL — AB (ref 70–99)
Glucose-Capillary: 95 mg/dL (ref 70–99)

## 2014-05-23 LAB — GLUCOSE, CAPILLARY
GLUCOSE-CAPILLARY: 180 mg/dL — AB (ref 70–99)
Glucose-Capillary: 114 mg/dL — ABNORMAL HIGH (ref 70–99)

## 2014-05-24 LAB — GLUCOSE, CAPILLARY
GLUCOSE-CAPILLARY: 85 mg/dL (ref 70–99)
Glucose-Capillary: 146 mg/dL — ABNORMAL HIGH (ref 70–99)

## 2014-05-25 LAB — GLUCOSE, CAPILLARY
GLUCOSE-CAPILLARY: 90 mg/dL (ref 70–99)
Glucose-Capillary: 143 mg/dL — ABNORMAL HIGH (ref 70–99)
Glucose-Capillary: 163 mg/dL — ABNORMAL HIGH (ref 70–99)

## 2014-05-26 LAB — GLUCOSE, CAPILLARY
Glucose-Capillary: 83 mg/dL (ref 70–99)
Glucose-Capillary: 194 mg/dL — ABNORMAL HIGH (ref 70–99)

## 2014-05-27 LAB — GLUCOSE, CAPILLARY: GLUCOSE-CAPILLARY: 89 mg/dL (ref 70–99)

## 2014-05-28 LAB — GLUCOSE, CAPILLARY
GLUCOSE-CAPILLARY: 80 mg/dL (ref 70–99)
GLUCOSE-CAPILLARY: 176 mg/dL — AB (ref 70–99)
Glucose-Capillary: 130 mg/dL — ABNORMAL HIGH (ref 70–99)

## 2014-05-29 LAB — GLUCOSE, CAPILLARY: Glucose-Capillary: 93 mg/dL (ref 70–99)

## 2014-05-30 LAB — GLUCOSE, CAPILLARY
GLUCOSE-CAPILLARY: 95 mg/dL (ref 70–99)
Glucose-Capillary: 187 mg/dL — ABNORMAL HIGH (ref 70–99)
Glucose-Capillary: 181 mg/dL — ABNORMAL HIGH (ref 70–99)

## 2014-05-31 LAB — GLUCOSE, CAPILLARY
GLUCOSE-CAPILLARY: 88 mg/dL (ref 70–99)
GLUCOSE-CAPILLARY: 126 mg/dL — AB (ref 70–99)

## 2014-06-01 LAB — GLUCOSE, CAPILLARY
Glucose-Capillary: 82 mg/dL (ref 70–99)
Glucose-Capillary: 199 mg/dL — ABNORMAL HIGH (ref 70–99)

## 2014-06-02 LAB — GLUCOSE, CAPILLARY: Glucose-Capillary: 84 mg/dL (ref 70–99)

## 2014-06-03 LAB — GLUCOSE, CAPILLARY
GLUCOSE-CAPILLARY: 115 mg/dL — AB (ref 70–99)
GLUCOSE-CAPILLARY: 194 mg/dL — AB (ref 70–99)
Glucose-Capillary: 156 mg/dL — ABNORMAL HIGH (ref 70–99)

## 2014-06-04 LAB — GLUCOSE, CAPILLARY
Glucose-Capillary: 221 mg/dL — ABNORMAL HIGH (ref 70–99)
Glucose-Capillary: 89 mg/dL (ref 70–99)
Glucose-Capillary: 245 mg/dL — ABNORMAL HIGH (ref 70–99)

## 2014-06-05 LAB — GLUCOSE, CAPILLARY
Glucose-Capillary: 95 mg/dL (ref 70–99)
Glucose-Capillary: 183 mg/dL — ABNORMAL HIGH (ref 70–99)

## 2014-06-06 LAB — GLUCOSE, CAPILLARY
GLUCOSE-CAPILLARY: 103 mg/dL — AB (ref 70–99)
GLUCOSE-CAPILLARY: 182 mg/dL — AB (ref 70–99)

## 2014-06-07 LAB — GLUCOSE, CAPILLARY: GLUCOSE-CAPILLARY: 103 mg/dL — AB (ref 70–99)

## 2014-06-08 LAB — GLUCOSE, CAPILLARY
GLUCOSE-CAPILLARY: 153 mg/dL — AB (ref 70–99)
GLUCOSE-CAPILLARY: 89 mg/dL (ref 70–99)
Glucose-Capillary: 156 mg/dL — ABNORMAL HIGH (ref 70–99)

## 2014-06-09 LAB — GLUCOSE, CAPILLARY: Glucose-Capillary: 86 mg/dL (ref 70–99)

## 2014-06-11 LAB — GLUCOSE, CAPILLARY
GLUCOSE-CAPILLARY: 233 mg/dL — AB (ref 70–99)
GLUCOSE-CAPILLARY: 104 mg/dL — AB (ref 70–99)
Glucose-Capillary: 92 mg/dL (ref 70–99)
Glucose-Capillary: 186 mg/dL — ABNORMAL HIGH (ref 70–99)

## 2014-06-12 LAB — GLUCOSE, CAPILLARY
GLUCOSE-CAPILLARY: 176 mg/dL — AB (ref 70–99)
Glucose-Capillary: 101 mg/dL — ABNORMAL HIGH (ref 70–99)

## 2014-06-13 LAB — GLUCOSE, CAPILLARY: Glucose-Capillary: 106 mg/dL — ABNORMAL HIGH (ref 70–99)

## 2014-06-14 LAB — GLUCOSE, CAPILLARY
GLUCOSE-CAPILLARY: 183 mg/dL — AB (ref 70–99)
GLUCOSE-CAPILLARY: 82 mg/dL (ref 70–99)
Glucose-Capillary: 221 mg/dL — ABNORMAL HIGH (ref 70–99)

## 2014-06-15 LAB — GLUCOSE, CAPILLARY: GLUCOSE-CAPILLARY: 93 mg/dL (ref 70–99)

## 2014-06-16 LAB — GLUCOSE, CAPILLARY
GLUCOSE-CAPILLARY: 180 mg/dL — AB (ref 70–99)
Glucose-Capillary: 194 mg/dL — ABNORMAL HIGH (ref 70–99)
Glucose-Capillary: 115 mg/dL — ABNORMAL HIGH (ref 70–99)

## 2014-06-18 LAB — GLUCOSE, CAPILLARY: Glucose-Capillary: 189 mg/dL — ABNORMAL HIGH (ref 70–99)

## 2014-06-19 LAB — GLUCOSE, CAPILLARY
GLUCOSE-CAPILLARY: 152 mg/dL — AB (ref 70–99)
GLUCOSE-CAPILLARY: 114 mg/dL — AB (ref 70–99)
Glucose-Capillary: 216 mg/dL — ABNORMAL HIGH (ref 70–99)
Glucose-Capillary: 109 mg/dL — ABNORMAL HIGH (ref 70–99)
Glucose-Capillary: 191 mg/dL — ABNORMAL HIGH (ref 70–99)

## 2014-06-20 LAB — GLUCOSE, CAPILLARY: GLUCOSE-CAPILLARY: 104 mg/dL — AB (ref 70–99)

## 2014-06-22 LAB — GLUCOSE, CAPILLARY
GLUCOSE-CAPILLARY: 112 mg/dL — AB (ref 70–99)
GLUCOSE-CAPILLARY: 225 mg/dL — AB (ref 70–99)
GLUCOSE-CAPILLARY: 104 mg/dL — AB (ref 70–99)
Glucose-Capillary: 189 mg/dL — ABNORMAL HIGH (ref 70–99)
Glucose-Capillary: 216 mg/dL — ABNORMAL HIGH (ref 70–99)

## 2014-06-23 LAB — GLUCOSE, CAPILLARY
Glucose-Capillary: 114 mg/dL — ABNORMAL HIGH (ref 70–99)
Glucose-Capillary: 160 mg/dL — ABNORMAL HIGH (ref 70–99)

## 2014-06-24 LAB — GLUCOSE, CAPILLARY
GLUCOSE-CAPILLARY: 187 mg/dL — AB (ref 70–99)
Glucose-Capillary: 105 mg/dL — ABNORMAL HIGH (ref 70–99)
Glucose-Capillary: 356 mg/dL — ABNORMAL HIGH (ref 70–99)

## 2014-06-26 LAB — GLUCOSE, CAPILLARY
GLUCOSE-CAPILLARY: 106 mg/dL — AB (ref 70–99)
GLUCOSE-CAPILLARY: 142 mg/dL — AB (ref 70–99)
Glucose-Capillary: 106 mg/dL — ABNORMAL HIGH (ref 70–99)
Glucose-Capillary: 191 mg/dL — ABNORMAL HIGH (ref 70–99)

## 2014-06-27 LAB — GLUCOSE, CAPILLARY: GLUCOSE-CAPILLARY: 90 mg/dL (ref 70–99)

## 2014-06-28 LAB — GLUCOSE, CAPILLARY
GLUCOSE-CAPILLARY: 87 mg/dL (ref 70–99)
Glucose-Capillary: 205 mg/dL — ABNORMAL HIGH (ref 70–99)

## 2014-06-29 LAB — GLUCOSE, CAPILLARY
GLUCOSE-CAPILLARY: 92 mg/dL (ref 70–99)
Glucose-Capillary: 223 mg/dL — ABNORMAL HIGH (ref 70–99)
Glucose-Capillary: 168 mg/dL — ABNORMAL HIGH (ref 70–99)

## 2014-06-30 LAB — GLUCOSE, CAPILLARY
GLUCOSE-CAPILLARY: 95 mg/dL (ref 70–99)
Glucose-Capillary: 236 mg/dL — ABNORMAL HIGH (ref 70–99)

## 2014-07-01 LAB — GLUCOSE, CAPILLARY: Glucose-Capillary: 111 mg/dL — ABNORMAL HIGH (ref 70–99)

## 2014-07-02 LAB — GLUCOSE, CAPILLARY
GLUCOSE-CAPILLARY: 92 mg/dL (ref 70–99)
Glucose-Capillary: 250 mg/dL — ABNORMAL HIGH (ref 70–99)
Glucose-Capillary: 193 mg/dL — ABNORMAL HIGH (ref 70–99)

## 2014-07-03 LAB — GLUCOSE, CAPILLARY: GLUCOSE-CAPILLARY: 175 mg/dL — AB (ref 70–99)

## 2014-07-04 LAB — GLUCOSE, CAPILLARY
GLUCOSE-CAPILLARY: 108 mg/dL — AB (ref 70–99)
Glucose-Capillary: 201 mg/dL — ABNORMAL HIGH (ref 70–99)

## 2014-07-05 LAB — GLUCOSE, CAPILLARY
Glucose-Capillary: 97 mg/dL (ref 70–99)
Glucose-Capillary: 246 mg/dL — ABNORMAL HIGH (ref 70–99)

## 2014-07-06 LAB — GLUCOSE, CAPILLARY
GLUCOSE-CAPILLARY: 80 mg/dL (ref 70–99)
Glucose-Capillary: 182 mg/dL — ABNORMAL HIGH (ref 70–99)

## 2014-07-08 LAB — GLUCOSE, CAPILLARY
Glucose-Capillary: 92 mg/dL (ref 70–99)
Glucose-Capillary: 205 mg/dL — ABNORMAL HIGH (ref 70–99)
Glucose-Capillary: 93 mg/dL (ref 70–99)

## 2014-07-09 LAB — GLUCOSE, CAPILLARY: Glucose-Capillary: 232 mg/dL — ABNORMAL HIGH (ref 70–99)

## 2014-07-10 LAB — GLUCOSE, CAPILLARY
GLUCOSE-CAPILLARY: 183 mg/dL — AB (ref 70–99)
GLUCOSE-CAPILLARY: 92 mg/dL (ref 70–99)
Glucose-Capillary: 106 mg/dL — ABNORMAL HIGH (ref 70–99)

## 2014-07-11 LAB — GLUCOSE, CAPILLARY
GLUCOSE-CAPILLARY: 136 mg/dL — AB (ref 70–99)
Glucose-Capillary: 85 mg/dL (ref 70–99)
Glucose-Capillary: 212 mg/dL — ABNORMAL HIGH (ref 70–99)

## 2014-07-12 LAB — GLUCOSE, CAPILLARY: Glucose-Capillary: 90 mg/dL (ref 70–99)

## 2014-07-13 LAB — GLUCOSE, CAPILLARY
GLUCOSE-CAPILLARY: 156 mg/dL — AB (ref 70–99)
GLUCOSE-CAPILLARY: 86 mg/dL (ref 70–99)
GLUCOSE-CAPILLARY: 208 mg/dL — AB (ref 70–99)

## 2014-07-14 LAB — GLUCOSE, CAPILLARY
Glucose-Capillary: 105 mg/dL — ABNORMAL HIGH (ref 70–99)
Glucose-Capillary: 177 mg/dL — ABNORMAL HIGH (ref 70–99)

## 2014-07-15 LAB — GLUCOSE, CAPILLARY
Glucose-Capillary: 95 mg/dL (ref 70–99)
Glucose-Capillary: 159 mg/dL — ABNORMAL HIGH (ref 70–99)

## 2014-07-16 LAB — GLUCOSE, CAPILLARY
GLUCOSE-CAPILLARY: 148 mg/dL — AB (ref 70–99)
Glucose-Capillary: 88 mg/dL (ref 70–99)

## 2014-07-17 LAB — GLUCOSE, CAPILLARY
GLUCOSE-CAPILLARY: 86 mg/dL (ref 70–99)
Glucose-Capillary: 156 mg/dL — ABNORMAL HIGH (ref 70–99)

## 2014-07-18 LAB — GLUCOSE, CAPILLARY
GLUCOSE-CAPILLARY: 88 mg/dL (ref 70–99)
Glucose-Capillary: 161 mg/dL — ABNORMAL HIGH (ref 70–99)

## 2014-07-20 LAB — GLUCOSE, CAPILLARY
GLUCOSE-CAPILLARY: 189 mg/dL — AB (ref 70–99)
GLUCOSE-CAPILLARY: 88 mg/dL (ref 70–99)
Glucose-Capillary: 86 mg/dL (ref 70–99)
Glucose-Capillary: 164 mg/dL — ABNORMAL HIGH (ref 70–99)

## 2014-07-21 LAB — GLUCOSE, CAPILLARY: GLUCOSE-CAPILLARY: 92 mg/dL (ref 70–99)

## 2014-07-22 LAB — GLUCOSE, CAPILLARY
GLUCOSE-CAPILLARY: 192 mg/dL — AB (ref 70–99)
Glucose-Capillary: 247 mg/dL — ABNORMAL HIGH (ref 70–99)
Glucose-Capillary: 112 mg/dL — ABNORMAL HIGH (ref 70–99)

## 2014-07-23 LAB — GLUCOSE, CAPILLARY
Glucose-Capillary: 95 mg/dL (ref 70–99)
Glucose-Capillary: 249 mg/dL — ABNORMAL HIGH (ref 70–99)

## 2014-07-24 LAB — GLUCOSE, CAPILLARY: GLUCOSE-CAPILLARY: 96 mg/dL (ref 70–99)

## 2014-07-25 LAB — GLUCOSE, CAPILLARY
GLUCOSE-CAPILLARY: 243 mg/dL — AB (ref 70–99)
GLUCOSE-CAPILLARY: 165 mg/dL — AB (ref 70–99)
Glucose-Capillary: 86 mg/dL (ref 70–99)

## 2014-07-26 LAB — GLUCOSE, CAPILLARY
GLUCOSE-CAPILLARY: 73 mg/dL (ref 70–99)
GLUCOSE-CAPILLARY: 197 mg/dL — AB (ref 70–99)

## 2014-07-27 LAB — GLUCOSE, CAPILLARY
Glucose-Capillary: 86 mg/dL (ref 70–99)
Glucose-Capillary: 139 mg/dL — ABNORMAL HIGH (ref 70–99)

## 2014-07-29 LAB — GLUCOSE, CAPILLARY
GLUCOSE-CAPILLARY: 96 mg/dL (ref 70–99)
GLUCOSE-CAPILLARY: 85 mg/dL (ref 70–99)
GLUCOSE-CAPILLARY: 208 mg/dL — AB (ref 70–99)
Glucose-Capillary: 226 mg/dL — ABNORMAL HIGH (ref 70–99)

## 2014-07-30 LAB — GLUCOSE, CAPILLARY: Glucose-Capillary: 86 mg/dL (ref 70–99)

## 2014-07-31 LAB — GLUCOSE, CAPILLARY: Glucose-Capillary: 101 mg/dL — ABNORMAL HIGH (ref 70–99)

## 2014-08-01 LAB — GLUCOSE, CAPILLARY
Glucose-Capillary: 174 mg/dL — ABNORMAL HIGH (ref 70–99)
Glucose-Capillary: 95 mg/dL (ref 70–99)

## 2014-08-02 LAB — GLUCOSE, CAPILLARY
GLUCOSE-CAPILLARY: 222 mg/dL — AB (ref 70–99)
Glucose-Capillary: 222 mg/dL — ABNORMAL HIGH (ref 70–99)
Glucose-Capillary: 97 mg/dL (ref 70–99)

## 2014-08-03 LAB — GLUCOSE, CAPILLARY
Glucose-Capillary: 101 mg/dL — ABNORMAL HIGH (ref 70–99)
Glucose-Capillary: 227 mg/dL — ABNORMAL HIGH (ref 70–99)

## 2014-08-04 LAB — GLUCOSE, CAPILLARY
GLUCOSE-CAPILLARY: 239 mg/dL — AB (ref 70–99)
Glucose-Capillary: 94 mg/dL (ref 70–99)

## 2014-08-05 LAB — GLUCOSE, CAPILLARY
GLUCOSE-CAPILLARY: 242 mg/dL — AB (ref 70–99)
Glucose-Capillary: 100 mg/dL — ABNORMAL HIGH (ref 70–99)

## 2014-08-06 LAB — GLUCOSE, CAPILLARY
GLUCOSE-CAPILLARY: 102 mg/dL — AB (ref 70–99)
Glucose-Capillary: 172 mg/dL — ABNORMAL HIGH (ref 70–99)

## 2014-08-07 ENCOUNTER — Non-Acute Institutional Stay (SKILLED_NURSING_FACILITY): Payer: Medicare Other | Admitting: Internal Medicine

## 2014-08-07 ENCOUNTER — Encounter: Payer: Self-pay | Admitting: Internal Medicine

## 2014-08-07 DIAGNOSIS — E785 Hyperlipidemia, unspecified: Secondary | ICD-10-CM

## 2014-08-07 DIAGNOSIS — F0280 Dementia in other diseases classified elsewhere without behavioral disturbance: Secondary | ICD-10-CM

## 2014-08-07 DIAGNOSIS — B20 Human immunodeficiency virus [HIV] disease: Secondary | ICD-10-CM

## 2014-08-07 DIAGNOSIS — I10 Essential (primary) hypertension: Secondary | ICD-10-CM

## 2014-08-07 DIAGNOSIS — N189 Chronic kidney disease, unspecified: Secondary | ICD-10-CM

## 2014-08-07 DIAGNOSIS — H109 Unspecified conjunctivitis: Secondary | ICD-10-CM

## 2014-08-07 DIAGNOSIS — E119 Type 2 diabetes mellitus without complications: Secondary | ICD-10-CM

## 2014-08-07 LAB — GLUCOSE, CAPILLARY: Glucose-Capillary: 100 mg/dL — ABNORMAL HIGH (ref 70–99)
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
## 2014-08-08 LAB — GLUCOSE, CAPILLARY
Glucose-Capillary: 186 mg/dL — ABNORMAL HIGH (ref 70–99)
Glucose-Capillary: 103 mg/dL — ABNORMAL HIGH (ref 70–99)

## 2014-08-09 LAB — GLUCOSE, CAPILLARY
Glucose-Capillary: 158 mg/dL — ABNORMAL HIGH (ref 70–99)
Glucose-Capillary: 113 mg/dL — ABNORMAL HIGH (ref 70–99)

## 2014-08-10 LAB — GLUCOSE, CAPILLARY
GLUCOSE-CAPILLARY: 140 mg/dL — AB (ref 70–99)
Glucose-Capillary: 116 mg/dL — ABNORMAL HIGH (ref 70–99)
Glucose-Capillary: 172 mg/dL — ABNORMAL HIGH (ref 70–99)

## 2014-08-11 LAB — GLUCOSE, CAPILLARY: Glucose-Capillary: 111 mg/dL — ABNORMAL HIGH (ref 70–99)

## 2014-08-12 LAB — GLUCOSE, CAPILLARY
GLUCOSE-CAPILLARY: 112 mg/dL — AB (ref 70–99)
Glucose-Capillary: 179 mg/dL — ABNORMAL HIGH (ref 70–99)

## 2014-08-13 LAB — GLUCOSE, CAPILLARY
GLUCOSE-CAPILLARY: 103 mg/dL — AB (ref 70–99)
GLUCOSE-CAPILLARY: 178 mg/dL — AB (ref 70–99)
Glucose-Capillary: 227 mg/dL — ABNORMAL HIGH (ref 70–99)

## 2014-08-14 LAB — GLUCOSE, CAPILLARY
GLUCOSE-CAPILLARY: 185 mg/dL — AB (ref 70–99)
Glucose-Capillary: 103 mg/dL — ABNORMAL HIGH (ref 70–99)

## 2014-08-15 LAB — GLUCOSE, CAPILLARY: GLUCOSE-CAPILLARY: 112 mg/dL — AB (ref 70–99)

## 2014-08-16 LAB — GLUCOSE, CAPILLARY
GLUCOSE-CAPILLARY: 197 mg/dL — AB (ref 70–99)
Glucose-Capillary: 177 mg/dL — ABNORMAL HIGH (ref 70–99)
Glucose-Capillary: 105 mg/dL — ABNORMAL HIGH (ref 70–99)

## 2014-08-17 LAB — GLUCOSE, CAPILLARY
GLUCOSE-CAPILLARY: 236 mg/dL — AB (ref 70–99)
Glucose-Capillary: 115 mg/dL — ABNORMAL HIGH (ref 70–99)

## 2014-08-18 LAB — GLUCOSE, CAPILLARY: GLUCOSE-CAPILLARY: 134 mg/dL — AB (ref 70–99)

## 2014-08-19 LAB — GLUCOSE, CAPILLARY
Glucose-Capillary: 127 mg/dL — ABNORMAL HIGH (ref 70–99)
Glucose-Capillary: 102 mg/dL — ABNORMAL HIGH (ref 70–99)
Glucose-Capillary: 212 mg/dL — ABNORMAL HIGH (ref 70–99)

## 2014-08-20 LAB — GLUCOSE, CAPILLARY: Glucose-Capillary: 105 mg/dL — ABNORMAL HIGH (ref 70–99)

## 2014-08-21 LAB — GLUCOSE, CAPILLARY: Glucose-Capillary: 241 mg/dL — ABNORMAL HIGH (ref 70–99)

## 2014-08-22 LAB — GLUCOSE, CAPILLARY
GLUCOSE-CAPILLARY: 114 mg/dL — AB (ref 70–99)
GLUCOSE-CAPILLARY: 116 mg/dL — AB (ref 70–99)
GLUCOSE-CAPILLARY: 165 mg/dL — AB (ref 70–99)
Glucose-Capillary: 205 mg/dL — ABNORMAL HIGH (ref 70–99)

## 2014-08-23 LAB — GLUCOSE, CAPILLARY: Glucose-Capillary: 116 mg/dL — ABNORMAL HIGH (ref 70–99)

## 2014-08-24 LAB — GLUCOSE, CAPILLARY
GLUCOSE-CAPILLARY: 210 mg/dL — AB (ref 70–99)
GLUCOSE-CAPILLARY: 117 mg/dL — AB (ref 70–99)
Glucose-Capillary: 181 mg/dL — ABNORMAL HIGH (ref 70–99)

## 2014-08-25 LAB — GLUCOSE, CAPILLARY
GLUCOSE-CAPILLARY: 106 mg/dL — AB (ref 70–99)
Glucose-Capillary: 163 mg/dL — ABNORMAL HIGH (ref 70–99)

## 2014-08-26 LAB — GLUCOSE, CAPILLARY
Glucose-Capillary: 127 mg/dL — ABNORMAL HIGH (ref 70–99)
Glucose-Capillary: 210 mg/dL — ABNORMAL HIGH (ref 70–99)

## 2014-08-27 LAB — GLUCOSE, CAPILLARY: Glucose-Capillary: 119 mg/dL — ABNORMAL HIGH (ref 70–99)

## 2014-08-28 LAB — GLUCOSE, CAPILLARY
GLUCOSE-CAPILLARY: 194 mg/dL — AB (ref 70–99)
Glucose-Capillary: 199 mg/dL — ABNORMAL HIGH (ref 70–99)
Glucose-Capillary: 119 mg/dL — ABNORMAL HIGH (ref 70–99)

## 2014-08-29 LAB — GLUCOSE, CAPILLARY: Glucose-Capillary: 105 mg/dL — ABNORMAL HIGH (ref 70–99)

## 2014-08-30 LAB — GLUCOSE, CAPILLARY
GLUCOSE-CAPILLARY: 192 mg/dL — AB (ref 70–99)
GLUCOSE-CAPILLARY: 123 mg/dL — AB (ref 70–99)

## 2014-08-31 LAB — GLUCOSE, CAPILLARY
Glucose-Capillary: 208 mg/dL — ABNORMAL HIGH (ref 70–99)
Glucose-Capillary: 97 mg/dL (ref 70–99)
Glucose-Capillary: 213 mg/dL — ABNORMAL HIGH (ref 70–99)

## 2014-09-01 LAB — GLUCOSE, CAPILLARY
Glucose-Capillary: 119 mg/dL — ABNORMAL HIGH (ref 70–99)
Glucose-Capillary: 208 mg/dL — ABNORMAL HIGH (ref 70–99)

## 2014-09-02 ENCOUNTER — Non-Acute Institutional Stay (SKILLED_NURSING_FACILITY): Payer: Medicare Other | Admitting: Internal Medicine

## 2014-09-02 ENCOUNTER — Encounter: Payer: Self-pay | Admitting: Internal Medicine

## 2014-09-02 DIAGNOSIS — I10 Essential (primary) hypertension: Secondary | ICD-10-CM

## 2014-09-02 DIAGNOSIS — K219 Gastro-esophageal reflux disease without esophagitis: Secondary | ICD-10-CM

## 2014-09-02 DIAGNOSIS — F0390 Unspecified dementia without behavioral disturbance: Secondary | ICD-10-CM

## 2014-09-02 DIAGNOSIS — E785 Hyperlipidemia, unspecified: Secondary | ICD-10-CM

## 2014-09-02 DIAGNOSIS — F0393 Unspecified dementia, unspecified severity, with mood disturbance: Secondary | ICD-10-CM

## 2014-09-02 DIAGNOSIS — I6350 Cerebral infarction due to unspecified occlusion or stenosis of unspecified cerebral artery: Secondary | ICD-10-CM

## 2014-09-02 DIAGNOSIS — N189 Chronic kidney disease, unspecified: Secondary | ICD-10-CM

## 2014-09-02 DIAGNOSIS — E119 Type 2 diabetes mellitus without complications: Secondary | ICD-10-CM

## 2014-09-02 DIAGNOSIS — H109 Unspecified conjunctivitis: Secondary | ICD-10-CM

## 2014-09-02 DIAGNOSIS — I639 Cerebral infarction, unspecified: Secondary | ICD-10-CM

## 2014-09-02 LAB — GLUCOSE, CAPILLARY
Glucose-Capillary: 129 mg/dL — ABNORMAL HIGH (ref 70–99)
Glucose-Capillary: 249 mg/dL — ABNORMAL HIGH (ref 70–99)
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
## 2014-09-03 LAB — GLUCOSE, CAPILLARY
Glucose-Capillary: 131 mg/dL — ABNORMAL HIGH (ref 70–99)
Glucose-Capillary: 241 mg/dL — ABNORMAL HIGH (ref 70–99)

## 2014-09-04 LAB — GLUCOSE, CAPILLARY
Glucose-Capillary: 123 mg/dL — ABNORMAL HIGH (ref 70–99)
Glucose-Capillary: 210 mg/dL — ABNORMAL HIGH (ref 70–99)

## 2014-09-05 LAB — GLUCOSE, CAPILLARY
Glucose-Capillary: 103 mg/dL — ABNORMAL HIGH (ref 70–99)
Glucose-Capillary: 204 mg/dL — ABNORMAL HIGH (ref 70–99)

## 2014-09-06 LAB — GLUCOSE, CAPILLARY
GLUCOSE-CAPILLARY: 100 mg/dL — AB (ref 70–99)
Glucose-Capillary: 191 mg/dL — ABNORMAL HIGH (ref 70–99)

## 2014-09-07 LAB — GLUCOSE, CAPILLARY
GLUCOSE-CAPILLARY: 109 mg/dL — AB (ref 70–99)
GLUCOSE-CAPILLARY: 149 mg/dL — AB (ref 70–99)

## 2014-09-08 LAB — GLUCOSE, CAPILLARY
Glucose-Capillary: 130 mg/dL — ABNORMAL HIGH (ref 70–99)
Glucose-Capillary: 201 mg/dL — ABNORMAL HIGH (ref 70–99)

## 2014-09-09 LAB — GLUCOSE, CAPILLARY
GLUCOSE-CAPILLARY: 112 mg/dL — AB (ref 70–99)
Glucose-Capillary: 156 mg/dL — ABNORMAL HIGH (ref 70–99)

## 2014-09-10 LAB — GLUCOSE, CAPILLARY: GLUCOSE-CAPILLARY: 111 mg/dL — AB (ref 70–99)

## 2014-09-11 LAB — GLUCOSE, CAPILLARY
Glucose-Capillary: 110 mg/dL — ABNORMAL HIGH (ref 70–99)
Glucose-Capillary: 69 mg/dL — ABNORMAL LOW (ref 70–99)

## 2014-09-12 LAB — GLUCOSE, CAPILLARY: Glucose-Capillary: 107 mg/dL — ABNORMAL HIGH (ref 70–99)

## 2014-09-13 LAB — GLUCOSE, CAPILLARY
GLUCOSE-CAPILLARY: 181 mg/dL — AB (ref 70–99)
Glucose-Capillary: 100 mg/dL — ABNORMAL HIGH (ref 70–99)

## 2014-09-14 LAB — GLUCOSE, CAPILLARY
GLUCOSE-CAPILLARY: 123 mg/dL — AB (ref 70–99)
Glucose-Capillary: 205 mg/dL — ABNORMAL HIGH (ref 70–99)
Glucose-Capillary: 169 mg/dL — ABNORMAL HIGH (ref 70–99)

## 2014-09-15 LAB — GLUCOSE, CAPILLARY: Glucose-Capillary: 95 mg/dL (ref 70–99)

## 2014-09-16 LAB — GLUCOSE, CAPILLARY
GLUCOSE-CAPILLARY: 152 mg/dL — AB (ref 70–99)
Glucose-Capillary: 102 mg/dL — ABNORMAL HIGH (ref 70–99)
Glucose-Capillary: 183 mg/dL — ABNORMAL HIGH (ref 70–99)

## 2014-09-17 LAB — GLUCOSE, CAPILLARY
GLUCOSE-CAPILLARY: 99 mg/dL (ref 70–99)
Glucose-Capillary: 159 mg/dL — ABNORMAL HIGH (ref 70–99)

## 2014-09-18 LAB — GLUCOSE, CAPILLARY: Glucose-Capillary: 94 mg/dL (ref 70–99)

## 2014-09-19 LAB — GLUCOSE, CAPILLARY
GLUCOSE-CAPILLARY: 109 mg/dL — AB (ref 70–99)
GLUCOSE-CAPILLARY: 80 mg/dL (ref 70–99)
Glucose-Capillary: 184 mg/dL — ABNORMAL HIGH (ref 70–99)

## 2014-09-20 LAB — GLUCOSE, CAPILLARY
GLUCOSE-CAPILLARY: 100 mg/dL — AB (ref 70–99)
Glucose-Capillary: 152 mg/dL — ABNORMAL HIGH (ref 70–99)

## 2014-09-21 LAB — GLUCOSE, CAPILLARY: Glucose-Capillary: 99 mg/dL (ref 70–99)

## 2014-09-22 LAB — GLUCOSE, CAPILLARY
Glucose-Capillary: 187 mg/dL — ABNORMAL HIGH (ref 70–99)
Glucose-Capillary: 109 mg/dL — ABNORMAL HIGH (ref 70–99)
Glucose-Capillary: 199 mg/dL — ABNORMAL HIGH (ref 70–99)

## 2014-09-23 LAB — GLUCOSE, CAPILLARY: Glucose-Capillary: 116 mg/dL — ABNORMAL HIGH (ref 70–99)

## 2014-09-24 LAB — GLUCOSE, CAPILLARY
GLUCOSE-CAPILLARY: 209 mg/dL — AB (ref 70–99)
GLUCOSE-CAPILLARY: 97 mg/dL (ref 70–99)

## 2014-09-25 LAB — GLUCOSE, CAPILLARY
GLUCOSE-CAPILLARY: 210 mg/dL — AB (ref 70–99)
Glucose-Capillary: 197 mg/dL — ABNORMAL HIGH (ref 70–99)
Glucose-Capillary: 96 mg/dL (ref 70–99)

## 2014-09-26 LAB — GLUCOSE, CAPILLARY
GLUCOSE-CAPILLARY: 107 mg/dL — AB (ref 70–99)
Glucose-Capillary: 161 mg/dL — ABNORMAL HIGH (ref 70–99)

## 2014-09-27 LAB — GLUCOSE, CAPILLARY
GLUCOSE-CAPILLARY: 109 mg/dL — AB (ref 70–99)
Glucose-Capillary: 213 mg/dL — ABNORMAL HIGH (ref 70–99)

## 2014-09-28 LAB — GLUCOSE, CAPILLARY
Glucose-Capillary: 135 mg/dL — ABNORMAL HIGH (ref 70–99)
Glucose-Capillary: 180 mg/dL — ABNORMAL HIGH (ref 70–99)

## 2014-09-29 LAB — GLUCOSE, CAPILLARY
GLUCOSE-CAPILLARY: 95 mg/dL (ref 70–99)
Glucose-Capillary: 178 mg/dL — ABNORMAL HIGH (ref 70–99)

## 2014-09-30 LAB — GLUCOSE, CAPILLARY
Glucose-Capillary: 119 mg/dL — ABNORMAL HIGH (ref 70–99)
Glucose-Capillary: 147 mg/dL — ABNORMAL HIGH (ref 70–99)

## 2014-10-01 LAB — GLUCOSE, CAPILLARY
Glucose-Capillary: 108 mg/dL — ABNORMAL HIGH (ref 70–99)
Glucose-Capillary: 167 mg/dL — ABNORMAL HIGH (ref 70–99)

## 2014-10-02 LAB — GLUCOSE, CAPILLARY
GLUCOSE-CAPILLARY: 102 mg/dL — AB (ref 70–99)
GLUCOSE-CAPILLARY: 182 mg/dL — AB (ref 70–99)

## 2014-10-03 LAB — GLUCOSE, CAPILLARY
Glucose-Capillary: 110 mg/dL — ABNORMAL HIGH (ref 70–99)
Glucose-Capillary: 169 mg/dL — ABNORMAL HIGH (ref 70–99)

## 2014-10-04 LAB — GLUCOSE, CAPILLARY
GLUCOSE-CAPILLARY: 123 mg/dL — AB (ref 70–99)
Glucose-Capillary: 223 mg/dL — ABNORMAL HIGH (ref 70–99)

## 2014-10-05 LAB — GLUCOSE, CAPILLARY: Glucose-Capillary: 107 mg/dL — ABNORMAL HIGH (ref 70–99)

## 2014-10-06 LAB — GLUCOSE, CAPILLARY
GLUCOSE-CAPILLARY: 177 mg/dL — AB (ref 70–99)
Glucose-Capillary: 107 mg/dL — ABNORMAL HIGH (ref 70–99)
Glucose-Capillary: 191 mg/dL — ABNORMAL HIGH (ref 70–99)

## 2014-10-07 LAB — GLUCOSE, CAPILLARY: Glucose-Capillary: 102 mg/dL — ABNORMAL HIGH (ref 70–99)

## 2014-10-08 LAB — GLUCOSE, CAPILLARY
Glucose-Capillary: 135 mg/dL — ABNORMAL HIGH (ref 70–99)
Glucose-Capillary: 90 mg/dL (ref 70–99)

## 2014-10-09 LAB — GLUCOSE, CAPILLARY
Glucose-Capillary: 125 mg/dL — ABNORMAL HIGH (ref 70–99)
Glucose-Capillary: 92 mg/dL (ref 70–99)

## 2014-10-10 LAB — GLUCOSE, CAPILLARY
GLUCOSE-CAPILLARY: 154 mg/dL — AB (ref 70–99)
Glucose-Capillary: 216 mg/dL — ABNORMAL HIGH (ref 70–99)
Glucose-Capillary: 105 mg/dL — ABNORMAL HIGH (ref 70–99)

## 2014-10-11 LAB — GLUCOSE, CAPILLARY
Glucose-Capillary: 90 mg/dL (ref 70–99)
Glucose-Capillary: 124 mg/dL — ABNORMAL HIGH (ref 70–99)

## 2014-10-12 LAB — GLUCOSE, CAPILLARY
GLUCOSE-CAPILLARY: 142 mg/dL — AB (ref 70–99)
Glucose-Capillary: 96 mg/dL (ref 70–99)

## 2014-10-13 LAB — GLUCOSE, CAPILLARY
Glucose-Capillary: 100 mg/dL — ABNORMAL HIGH (ref 70–99)
Glucose-Capillary: 249 mg/dL — ABNORMAL HIGH (ref 70–99)

## 2014-10-14 LAB — GLUCOSE, CAPILLARY
GLUCOSE-CAPILLARY: 92 mg/dL (ref 70–99)
Glucose-Capillary: 89 mg/dL (ref 70–99)

## 2014-10-15 LAB — GLUCOSE, CAPILLARY
GLUCOSE-CAPILLARY: 226 mg/dL — AB (ref 70–99)
Glucose-Capillary: 110 mg/dL — ABNORMAL HIGH (ref 70–99)
Glucose-Capillary: 249 mg/dL — ABNORMAL HIGH (ref 70–99)

## 2014-10-16 LAB — GLUCOSE, CAPILLARY: Glucose-Capillary: 114 mg/dL — ABNORMAL HIGH (ref 70–99)

## 2014-10-17 LAB — GLUCOSE, CAPILLARY
GLUCOSE-CAPILLARY: 223 mg/dL — AB (ref 70–99)
Glucose-Capillary: 113 mg/dL — ABNORMAL HIGH (ref 70–99)

## 2014-10-18 LAB — GLUCOSE, CAPILLARY: GLUCOSE-CAPILLARY: 208 mg/dL — AB (ref 70–99)

## 2014-10-19 LAB — GLUCOSE, CAPILLARY
GLUCOSE-CAPILLARY: 173 mg/dL — AB (ref 70–99)
Glucose-Capillary: 100 mg/dL — ABNORMAL HIGH (ref 70–99)
Glucose-Capillary: 107 mg/dL — ABNORMAL HIGH (ref 70–99)
Glucose-Capillary: 82 mg/dL (ref 70–99)

## 2014-10-20 LAB — GLUCOSE, CAPILLARY
GLUCOSE-CAPILLARY: 112 mg/dL — AB (ref 70–99)
GLUCOSE-CAPILLARY: 187 mg/dL — AB (ref 70–99)

## 2014-10-21 LAB — GLUCOSE, CAPILLARY: Glucose-Capillary: 104 mg/dL — ABNORMAL HIGH (ref 70–99)

## 2014-10-22 LAB — GLUCOSE, CAPILLARY
GLUCOSE-CAPILLARY: 258 mg/dL — AB (ref 70–99)
Glucose-Capillary: 115 mg/dL — ABNORMAL HIGH (ref 70–99)

## 2014-10-23 LAB — GLUCOSE, CAPILLARY
GLUCOSE-CAPILLARY: 219 mg/dL — AB (ref 70–99)
GLUCOSE-CAPILLARY: 151 mg/dL — AB (ref 70–99)
Glucose-Capillary: 117 mg/dL — ABNORMAL HIGH (ref 70–99)

## 2014-10-24 LAB — GLUCOSE, CAPILLARY: Glucose-Capillary: 132 mg/dL — ABNORMAL HIGH (ref 70–99)

## 2014-10-25 LAB — GLUCOSE, CAPILLARY
GLUCOSE-CAPILLARY: 126 mg/dL — AB (ref 70–99)
Glucose-Capillary: 190 mg/dL — ABNORMAL HIGH (ref 70–99)

## 2014-10-26 LAB — GLUCOSE, CAPILLARY
GLUCOSE-CAPILLARY: 119 mg/dL — AB (ref 70–99)
Glucose-Capillary: 170 mg/dL — ABNORMAL HIGH (ref 70–99)

## 2014-10-27 LAB — GLUCOSE, CAPILLARY
GLUCOSE-CAPILLARY: 112 mg/dL — AB (ref 70–99)
Glucose-Capillary: 190 mg/dL — ABNORMAL HIGH (ref 70–99)

## 2014-10-28 LAB — GLUCOSE, CAPILLARY
GLUCOSE-CAPILLARY: 112 mg/dL — AB (ref 70–99)
Glucose-Capillary: 208 mg/dL — ABNORMAL HIGH (ref 70–99)

## 2014-10-29 LAB — GLUCOSE, CAPILLARY: Glucose-Capillary: 106 mg/dL — ABNORMAL HIGH (ref 70–99)

## 2014-10-30 LAB — GLUCOSE, CAPILLARY
GLUCOSE-CAPILLARY: 240 mg/dL — AB (ref 70–99)
GLUCOSE-CAPILLARY: 118 mg/dL — AB (ref 70–99)

## 2014-10-31 LAB — GLUCOSE, CAPILLARY
GLUCOSE-CAPILLARY: 105 mg/dL — AB (ref 70–99)
Glucose-Capillary: 188 mg/dL — ABNORMAL HIGH (ref 70–99)
Glucose-Capillary: 172 mg/dL — ABNORMAL HIGH (ref 70–99)

## 2014-11-01 LAB — GLUCOSE, CAPILLARY
GLUCOSE-CAPILLARY: 103 mg/dL — AB (ref 70–99)
GLUCOSE-CAPILLARY: 158 mg/dL — AB (ref 70–99)

## 2014-11-12 ENCOUNTER — Encounter: Payer: Self-pay | Admitting: Internal Medicine

## 2014-11-12 ENCOUNTER — Non-Acute Institutional Stay (SKILLED_NURSING_FACILITY): Payer: Medicare Other | Admitting: Internal Medicine

## 2014-11-12 DIAGNOSIS — E119 Type 2 diabetes mellitus without complications: Secondary | ICD-10-CM | POA: Diagnosis not present

## 2014-11-12 DIAGNOSIS — F0390 Unspecified dementia without behavioral disturbance: Secondary | ICD-10-CM | POA: Diagnosis not present

## 2014-11-12 DIAGNOSIS — N189 Chronic kidney disease, unspecified: Secondary | ICD-10-CM | POA: Diagnosis not present

## 2014-11-12 DIAGNOSIS — I10 Essential (primary) hypertension: Secondary | ICD-10-CM
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
## 2014-11-14 ENCOUNTER — Encounter (HOSPITAL_COMMUNITY)
Admission: RE | Admit: 2014-11-14 | Discharge: 2014-11-14 | Disposition: A | Discharge status: Home / Self Care | Payer: Medicare Other | Source: Ambulatory Visit | Attending: Internal Medicine | Admitting: Internal Medicine

## 2014-11-14 DIAGNOSIS — N189 Chronic kidney disease, unspecified: Secondary | ICD-10-CM | POA: Diagnosis not present

## 2014-11-14 DIAGNOSIS — E119 Type 2 diabetes mellitus without complications: Secondary | ICD-10-CM | POA: Diagnosis not present

## 2014-11-14 DIAGNOSIS — I10 Essential (primary) hypertension: Secondary | ICD-10-CM | POA: Diagnosis not present

## 2014-11-14 DIAGNOSIS — F0390 Unspecified dementia without behavioral disturbance: Secondary | ICD-10-CM | POA: Insufficient documentation

## 2014-11-14 LAB — COMPREHENSIVE METABOLIC PANEL
ALBUMIN: 3.2 g/dL — AB (ref 3.5–5.2)
ALT: 12 U/L (ref 0–35)
ANION GAP: 3 — AB (ref 5–15)
AST: 16 U/L (ref 0–37)
Alkaline Phosphatase: 117 U/L (ref 39–117)
BILIRUBIN TOTAL: 0.3 mg/dL (ref 0.3–1.2)
BUN: 31 mg/dL — AB (ref 6–23)
CO2: 29 mmol/L (ref 19–32)
Calcium: 9.5 mg/dL (ref 8.4–10.5)
Chloride: 110 mmol/L (ref 96–112)
Creatinine, Ser: 1.49 mg/dL — ABNORMAL HIGH (ref 0.50–1.10)
GFR calc Af Amer: 35 mL/min — ABNORMAL LOW (ref >90)
GFR calc non Af Amer: 30 mL/min — ABNORMAL LOW (ref >90)
GLUCOSE: 112 mg/dL — AB (ref 70–99)
POTASSIUM: 3.8 mmol/L (ref 3.5–5.1)
Sodium: 142 mmol/L (ref 135–145)
TOTAL PROTEIN: 6.9 g/dL (ref 6.0–8.3)

## 2014-11-14 LAB — CBC WITH DIFFERENTIAL/PLATELET
Basophils Absolute: 0 10*3/uL (ref 0.0–0.1)
Basophils Relative: 1 % (ref 0–1)
EOS PCT: 2 % (ref 0–5)
Eosinophils Absolute: 0.1 10*3/uL (ref 0.0–0.7)
HCT: 33.6 % — ABNORMAL LOW (ref 36.0–46.0)
Hemoglobin: 10.4 g/dL — ABNORMAL LOW (ref 12.0–15.0)
LYMPHS ABS: 2.4 10*3/uL (ref 0.7–4.0)
Lymphocytes Relative: 28 % (ref 12–46)
MCH: 27.2 pg (ref 26.0–34.0)
MCHC: 31 g/dL (ref 30.0–36.0)
MCV: 87.7 fL (ref 78.0–100.0)
MONOS PCT: 7 % (ref 3–12)
Monocytes Absolute: 0.6 10*3/uL (ref 0.1–1.0)
Neutro Abs: 5.4 10*3/uL (ref 1.7–7.7)
Neutrophils Relative %: 62 % (ref 43–77)
Platelets: 143 10*3/uL — ABNORMAL LOW (ref 150–400)
RBC: 3.83 MIL/uL — AB (ref 3.87–5.11)
RDW: 14.9 % (ref 11.5–15.5)
WBC: 8.5 10*3/uL (ref 4.0–10.5)

## 2015-02-22 ENCOUNTER — Encounter: Payer: Self-pay | Admitting: Internal Medicine

## 2015-02-22 ENCOUNTER — Non-Acute Institutional Stay (SKILLED_NURSING_FACILITY): Payer: Medicare Other | Admitting: Internal Medicine

## 2015-02-22 DIAGNOSIS — F0390 Unspecified dementia without behavioral disturbance: Secondary | ICD-10-CM

## 2015-02-22 DIAGNOSIS — N189 Chronic kidney disease, unspecified: Secondary | ICD-10-CM | POA: Diagnosis not present

## 2015-02-22 DIAGNOSIS — I10 Essential (primary) hypertension: Secondary | ICD-10-CM | POA: Diagnosis not present

## 2015-02-22 DIAGNOSIS — H109 Unspecified conjunctivitis: Secondary | ICD-10-CM
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
## 2015-02-22 NOTE — Progress Notes (Signed)
Patient ID: Nancy [name], female   DOB: 04/21/1925, 81 y.o.   MRN: [id number]     Of note further information from nursing on CBG's shows CBG's largely lower 100's in AM  But more variable at HS--112-249--again will await update   [id number]

## 2015-02-23 ENCOUNTER — Encounter (HOSPITAL_COMMUNITY)
Admission: AD | Admit: 2015-02-23 | Discharge: 2015-02-23 | Disposition: A | Discharge status: Home / Self Care | Payer: Medicare Other | Source: Skilled Nursing Facility | Attending: Internal Medicine | Admitting: Internal Medicine

## 2015-02-23 DIAGNOSIS — E119 Type 2 diabetes mellitus without complications: Secondary | ICD-10-CM | POA: Diagnosis not present

## 2015-02-23 DIAGNOSIS — E785 Hyperlipidemia, unspecified: Secondary | ICD-10-CM | POA: Diagnosis not present

## 2015-02-23 LAB — COMPREHENSIVE METABOLIC PANEL
ALBUMIN: 3.3 g/dL — AB (ref 3.5–5.0)
ALT: 10 U/L — ABNORMAL LOW (ref 14–54)
ANION GAP: 6 (ref 5–15)
AST: 15 U/L (ref 15–41)
Alkaline Phosphatase: 104 U/L (ref 38–126)
BUN: 29 mg/dL — AB (ref 6–20)
CO2: 26 mmol/L (ref 22–32)
CREATININE: 1.34 mg/dL — AB (ref 0.44–1.00)
Calcium: 9.5 mg/dL (ref 8.9–10.3)
Chloride: 112 mmol/L — ABNORMAL HIGH (ref 101–111)
GFR calc Af Amer: 40 mL/min — ABNORMAL LOW (ref >60)
GFR, EST NON AFRICAN AMERICAN: 35 mL/min — AB (ref >60)
GLUCOSE: 102 mg/dL — AB (ref 65–99)
POTASSIUM: 4.2 mmol/L (ref 3.5–5.1)
SODIUM: 144 mmol/L (ref 135–145)
Total Bilirubin: 0.3 mg/dL (ref 0.3–1.2)
Total Protein: 6.6 g/dL (ref 6.5–8.1)

## 2015-02-23 LAB — CBC WITH DIFFERENTIAL/PLATELET
Basophils Absolute: 0 10*3/uL (ref 0.0–0.1)
Basophils Relative: 1 % (ref 0–1)
Eosinophils Absolute: 0.2 10*3/uL (ref 0.0–0.7)
Eosinophils Relative: 4 % (ref 0–5)
HEMATOCRIT: 32.6 % — AB (ref 36.0–46.0)
Hemoglobin: 10.2 g/dL — ABNORMAL LOW (ref 12.0–15.0)
Lymphocytes Relative: 48 % — ABNORMAL HIGH (ref 12–46)
Lymphs Abs: 2.1 10*3/uL (ref 0.7–4.0)
MCH: 27.5 pg (ref 26.0–34.0)
MCHC: 31.3 g/dL (ref 30.0–36.0)
MCV: 87.9 fL (ref 78.0–100.0)
MONO ABS: 0.4 10*3/uL (ref 0.1–1.0)
Monocytes Relative: 9 % (ref 3–12)
NEUTROS ABS: 1.7 10*3/uL (ref 1.7–7.7)
Neutrophils Relative %: 38 % — ABNORMAL LOW (ref 43–77)
Platelets: 150 10*3/uL (ref 150–400)
RBC: 3.71 MIL/uL — AB (ref 3.87–5.11)
RDW: 14.9 % (ref 11.5–15.5)
WBC: 4.3 10*3/uL (ref 4.0–10.5)

## 2015-02-23 LAB — LIPID PANEL
CHOL/HDL RATIO: 2.7 ratio
CHOLESTEROL: 116 mg/dL (ref 0–200)
HDL: 43 mg/dL (ref >40)
LDL Cholesterol: 58 mg/dL (ref 0–99)
Triglycerides: 73 mg/dL (ref <150)
VLDL: 15 mg/dL (ref 0–40)

## 2015-02-24 LAB — HEMOGLOBIN A1C
Hgb A1c MFr Bld: 8.4 % — ABNORMAL HIGH (ref 4.8–5.6)
MEAN PLASMA GLUCOSE: 194 mg/dL

## 2015-03-10 ENCOUNTER — Other Ambulatory Visit (HOSPITAL_COMMUNITY)
Admission: RE | Admit: 2015-03-10 | Discharge: 2015-03-10 | Disposition: A | Discharge status: Home / Self Care | Payer: Medicare Other | Source: Other Acute Inpatient Hospital | Attending: Internal Medicine | Admitting: Internal Medicine

## 2015-03-10 ENCOUNTER — Non-Acute Institutional Stay (SKILLED_NURSING_FACILITY): Payer: Medicare Other | Admitting: Internal Medicine

## 2015-03-10 DIAGNOSIS — H109 Unspecified conjunctivitis: Secondary | ICD-10-CM

## 2015-03-10 DIAGNOSIS — N289 Disorder of kidney and ureter, unspecified: Secondary | ICD-10-CM

## 2015-03-10 NOTE — Progress Notes (Signed)
Patient ID: Nancy [name], female   DOB: Jan 15, 1925, 81 y.o.   MRN: [id number]      This is an acute visit.  Level care skilled.  Facility [facility].  Chief complaint-acute visit secondary to eye erythema and drainage suspect conjunctivitis.  History of present illness.  Patient is an elderly resident with a history at times of intermittent conjunctivitis.  This is complicated somewhat with a history of lacrimal duct obstruction status post stenting--this actually has helped improve the recurrent conjunctivitis but still has this at times.  She usually responds to a course of topical Anna biotics gentamicin.  Apparently she is starting to develop again some left eye exudate and redness and I been asked to look at this.  Her vital signs are stable patient has no acute complaints she is a poor strain secondary to dementia.  Family medical social history is been reviewed per recent progress notes most recently 02/22/2015.  Medications have been reviewed per MAR.  Review of systems again quite limited secondary to dementia she is not complaining of any eye pain or visual changes.  Physical exam.  She is afebrile pulse of 68 respirations 18.  In general this is a somewhat frail elderly female in no distress she appears to be at her baseline.  Her skin is warm and dry.  Eyes she does have some erythema greater left versus right conjunctivae-there is some cream-colored exudate of the left eye do not really see exudate on the right eye yet.  Heart is regular rate and rhythm without murmur gallop or rub.  Chest is clear to auscultation there is no labored breathing.  Labs.  .  02/23/2015.   sodium 144 potassium 4.2 BUN 29 creatinine 1.34. ALT 10albumen 3.3 otherwise liver function tests within normal limits.   WBC 4.3 hemoglobin 10.2 platelets 150    assessment and plan.   #1-conjunctivitis-will restart gentamicin eyedrops to both eyes and monitor also will  obtain a culture of any exudate she does have a history of MRSA infection.   #2 history renal insufficiency most recent creatinine 1.3 for which appears to be relatively baseline  . [id number]

## 2015-03-14 LAB — WOUND CULTURE: GRAM STAIN: NONE SEEN

## 2015-08-15 ENCOUNTER — Encounter: Payer: Self-pay | Admitting: Internal Medicine

## 2015-08-15 ENCOUNTER — Non-Acute Institutional Stay (SKILLED_NURSING_FACILITY): Payer: Medicare Other | Admitting: Internal Medicine

## 2015-08-15 DIAGNOSIS — N189 Chronic kidney disease, unspecified: Secondary | ICD-10-CM

## 2015-08-15 DIAGNOSIS — E1121 Type 2 diabetes mellitus with diabetic nephropathy: Secondary | ICD-10-CM

## 2015-08-15 DIAGNOSIS — F0390 Unspecified dementia without behavioral disturbance: Secondary | ICD-10-CM | POA: Diagnosis not present

## 2015-08-15 DIAGNOSIS — I10 Essential (primary) hypertension: Secondary | ICD-10-CM
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
## 2015-08-16 ENCOUNTER — Encounter (HOSPITAL_COMMUNITY)
Admission: AD | Admit: 2015-08-16 | Discharge: 2015-08-16 | Disposition: A | Discharge status: Home / Self Care | Payer: Medicare Other | Source: Skilled Nursing Facility | Attending: Internal Medicine | Admitting: Internal Medicine

## 2015-08-16 DIAGNOSIS — E785 Hyperlipidemia, unspecified: Secondary | ICD-10-CM | POA: Diagnosis present

## 2015-08-16 DIAGNOSIS — E119 Type 2 diabetes mellitus without complications: Secondary | ICD-10-CM | POA: Diagnosis present

## 2015-08-16 LAB — LIPID PANEL
Cholesterol: 126 mg/dL (ref 0–200)
HDL: 49 mg/dL (ref >40)
LDL CALC: 55 mg/dL (ref 0–99)
Total CHOL/HDL Ratio: 2.6 RATIO
Triglycerides: 110 mg/dL (ref <150)
VLDL: 22 mg/dL (ref 0–40)

## 2015-08-16 LAB — CBC WITH DIFFERENTIAL/PLATELET
Basophils Absolute: 0.1 10*3/uL (ref 0.0–0.1)
Basophils Relative: 1 %
EOS ABS: 0.1 10*3/uL (ref 0.0–0.7)
Eosinophils Relative: 3 %
HEMATOCRIT: 36.6 % (ref 36.0–46.0)
HEMOGLOBIN: 11.5 g/dL — AB (ref 12.0–15.0)
LYMPHS ABS: 2.3 10*3/uL (ref 0.7–4.0)
Lymphocytes Relative: 48 %
MCH: 27.6 pg (ref 26.0–34.0)
MCHC: 31.4 g/dL (ref 30.0–36.0)
MCV: 87.8 fL (ref 78.0–100.0)
Monocytes Absolute: 0.4 10*3/uL (ref 0.1–1.0)
Monocytes Relative: 9 %
NEUTROS ABS: 1.8 10*3/uL (ref 1.7–7.7)
NEUTROS PCT: 39 %
Platelets: 180 10*3/uL (ref 150–400)
RBC: 4.17 MIL/uL (ref 3.87–5.11)
RDW: 15.1 % (ref 11.5–15.5)
WBC: 4.7 10*3/uL (ref 4.0–10.5)

## 2015-08-16 LAB — COMPREHENSIVE METABOLIC PANEL
ALT: 15 U/L (ref 14–54)
AST: 17 U/L (ref 15–41)
Albumin: 3.6 g/dL (ref 3.5–5.0)
Alkaline Phosphatase: 123 U/L (ref 38–126)
Anion gap: 3 — ABNORMAL LOW (ref 5–15)
BILIRUBIN TOTAL: 0.7 mg/dL (ref 0.3–1.2)
BUN: 30 mg/dL — ABNORMAL HIGH (ref 6–20)
CO2: 29 mmol/L (ref 22–32)
CREATININE: 1.24 mg/dL — AB (ref 0.44–1.00)
Calcium: 10 mg/dL (ref 8.9–10.3)
Chloride: 110 mmol/L (ref 101–111)
GFR calc non Af Amer: 38 mL/min — ABNORMAL LOW (ref >60)
GFR, EST AFRICAN AMERICAN: 44 mL/min — AB (ref >60)
Glucose, Bld: 113 mg/dL — ABNORMAL HIGH (ref 65–99)
Potassium: 3.9 mmol/L (ref 3.5–5.1)
Sodium: 142 mmol/L (ref 135–145)
Total Protein: 7.5 g/dL (ref 6.5–8.1)

## 2015-08-17 LAB — HEMOGLOBIN A1C
HEMOGLOBIN A1C: 8.2 % — AB (ref 4.8–5.6)
MEAN PLASMA GLUCOSE: 189 mg/dL

## 2015-11-14 ENCOUNTER — Ambulatory Visit (HOSPITAL_COMMUNITY)
Admit: 2015-11-14 | Discharge: 2015-11-14 | Disposition: A | Discharge status: Home / Self Care | Payer: Medicare Other | Source: Ambulatory Visit | Attending: Internal Medicine | Admitting: Internal Medicine

## 2015-11-14 ENCOUNTER — Other Ambulatory Visit (HOSPITAL_COMMUNITY)
Admission: AD | Admit: 2015-11-14 | Discharge: 2015-11-14 | Disposition: A | Discharge status: Home / Self Care | Payer: Medicare Other | Source: Skilled Nursing Facility | Attending: Internal Medicine | Admitting: Internal Medicine

## 2015-11-14 ENCOUNTER — Non-Acute Institutional Stay (SKILLED_NURSING_FACILITY): Payer: Medicare Other | Admitting: Internal Medicine

## 2015-11-14 DIAGNOSIS — F0393 Unspecified dementia, unspecified severity, with mood disturbance: Secondary | ICD-10-CM

## 2015-11-14 DIAGNOSIS — F0390 Unspecified dementia without behavioral disturbance: Secondary | ICD-10-CM | POA: Diagnosis not present

## 2015-11-14 DIAGNOSIS — E1121 Type 2 diabetes mellitus with diabetic nephropathy: Secondary | ICD-10-CM | POA: Diagnosis not present

## 2015-11-14 DIAGNOSIS — N189 Chronic kidney disease, unspecified: Secondary | ICD-10-CM

## 2015-11-14 DIAGNOSIS — R059 Cough, unspecified: Secondary | ICD-10-CM

## 2015-11-14 DIAGNOSIS — I10 Essential (primary) hypertension: Secondary | ICD-10-CM

## 2015-11-14 DIAGNOSIS — R05 Cough: Secondary | ICD-10-CM | POA: Insufficient documentation

## 2015-11-14 LAB — INFLUENZA PANEL BY PCR (TYPE A & B)
H1N1FLUPCR: NOT DETECTED
INFLBPCR: NEGATIVE
Influenza A By PCR: NEGATIVE
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
## 2015-11-15 ENCOUNTER — Encounter (HOSPITAL_COMMUNITY)
Admission: RE | Admit: 2015-11-15 | Discharge: 2015-11-15 | Disposition: A | Discharge status: Home / Self Care | Payer: Medicare Other | Source: Skilled Nursing Facility | Attending: Internal Medicine | Admitting: Internal Medicine

## 2015-11-15 DIAGNOSIS — K219 Gastro-esophageal reflux disease without esophagitis: Secondary | ICD-10-CM | POA: Insufficient documentation

## 2015-11-15 DIAGNOSIS — E119 Type 2 diabetes mellitus without complications: Secondary | ICD-10-CM | POA: Diagnosis present

## 2015-11-15 DIAGNOSIS — I2510 Atherosclerotic heart disease of native coronary artery without angina pectoris: Secondary | ICD-10-CM | POA: Diagnosis present

## 2015-11-15 DIAGNOSIS — I10 Essential (primary) hypertension: Secondary | ICD-10-CM | POA: Insufficient documentation

## 2015-11-15 LAB — COMPREHENSIVE METABOLIC PANEL
ALK PHOS: 93 U/L (ref 38–126)
ALT: 25 U/L (ref 14–54)
ANION GAP: 7 (ref 5–15)
AST: 25 U/L (ref 15–41)
Albumin: 2.9 g/dL — ABNORMAL LOW (ref 3.5–5.0)
BUN: 27 mg/dL — ABNORMAL HIGH (ref 6–20)
CO2: 25 mmol/L (ref 22–32)
Calcium: 9.7 mg/dL (ref 8.9–10.3)
Chloride: 109 mmol/L (ref 101–111)
Creatinine, Ser: 1.25 mg/dL — ABNORMAL HIGH (ref 0.44–1.00)
GFR calc non Af Amer: 37 mL/min — ABNORMAL LOW (ref >60)
GFR, EST AFRICAN AMERICAN: 43 mL/min — AB (ref >60)
Glucose, Bld: 189 mg/dL — ABNORMAL HIGH (ref 65–99)
Potassium: 3.6 mmol/L (ref 3.5–5.1)
SODIUM: 141 mmol/L (ref 135–145)
Total Bilirubin: 0.4 mg/dL (ref 0.3–1.2)
Total Protein: 7.3 g/dL (ref 6.5–8.1)

## 2015-11-15 LAB — CBC WITH DIFFERENTIAL/PLATELET
Basophils Absolute: 0 10*3/uL (ref 0.0–0.1)
Basophils Relative: 0 %
EOS PCT: 1 %
Eosinophils Absolute: 0.1 10*3/uL (ref 0.0–0.7)
HCT: 33.1 % — ABNORMAL LOW (ref 36.0–46.0)
HEMOGLOBIN: 10.6 g/dL — AB (ref 12.0–15.0)
LYMPHS ABS: 1.6 10*3/uL (ref 0.7–4.0)
LYMPHS PCT: 20 %
MCH: 27.6 pg (ref 26.0–34.0)
MCHC: 32 g/dL (ref 30.0–36.0)
MCV: 86.2 fL (ref 78.0–100.0)
MONOS PCT: 9 %
Monocytes Absolute: 0.7 10*3/uL (ref 0.1–1.0)
Neutro Abs: 5.6 10*3/uL (ref 1.7–7.7)
Neutrophils Relative %: 70 %
PLATELETS: 179 10*3/uL (ref 150–400)
RBC: 3.84 MIL/uL — AB (ref 3.87–5.11)
RDW: 14.8 % (ref 11.5–15.5)
WBC: 8.1 10*3/uL (ref 4.0–10.5)

## 2015-11-16 LAB — HEMOGLOBIN A1C
HEMOGLOBIN A1C: 8.4 % — AB (ref 4.8–5.6)
MEAN PLASMA GLUCOSE: 194 mg/dL

## 2015-11-19 ENCOUNTER — Encounter: Payer: Self-pay | Admitting: Internal Medicine

## 2015-11-19 DIAGNOSIS — R05 Cough: Secondary | ICD-10-CM | POA: Insufficient documentation

## 2015-11-19 DIAGNOSIS — R059 Cough, unspecified: Secondary | ICD-10-CM | POA: Insufficient documentation

## 2015-11-24 ENCOUNTER — Non-Acute Institutional Stay (SKILLED_NURSING_FACILITY): Payer: Medicare Other | Admitting: Internal Medicine

## 2015-11-24 DIAGNOSIS — N189 Chronic kidney disease, unspecified: Secondary | ICD-10-CM

## 2015-11-24 DIAGNOSIS — J209 Acute bronchitis, unspecified: Secondary | ICD-10-CM

## 2015-11-24 DIAGNOSIS — E1121 Type 2 diabetes mellitus with diabetic nephropathy: Secondary | ICD-10-CM

## 2015-11-24 DIAGNOSIS — D638 Anemia in other chronic diseases classified elsewhere: Secondary | ICD-10-CM
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
## 2015-11-26 ENCOUNTER — Encounter: Payer: Self-pay | Admitting: Internal Medicine

## 2015-11-26 ENCOUNTER — Non-Acute Institutional Stay (SKILLED_NURSING_FACILITY): Payer: Medicare Other | Admitting: Internal Medicine

## 2015-11-26 DIAGNOSIS — E1121 Type 2 diabetes mellitus with diabetic nephropathy: Secondary | ICD-10-CM

## 2015-11-26 NOTE — Progress Notes (Signed)
Patient ID: Nancy [name], female   DOB: 1925-07-18, 82 y.o.   MRN: [id number]    Facility: [facility] Chief complaint: hyperglycemia History: This patient is a type 2 diabetic. She was not on any treatment for a period of time after being admitted to this faciltiy. I started her on glyburide in March of 2015 at 2.5 mg.  She had remained stable for the last 2 years although her HgbA1c has been elevated. Over 8 for most of this year. Her glucotrol was increased to 5mg  on 11/24/15. She has  Recently been rxed with Levaquin for an upper resp tract issue. Urine culture done and results are pending  CBGwas 400 ac lunch today. On 11/25/15  22/295/202/336  BMP Latest Ref Rng 11/15/2015 08/16/2015 02/23/2015  Glucose 65 - 99 mg/dL 189(H) 113(H) 102(H)  BUN 6 - 20 mg/dL 27(H) 30(H) 29(H)  Creatinine 0.44 - 1.00 mg/dL 1.25(H) 1.24(H) 1.34(H)  Sodium 135 - 145 mmol/L 141 142 144  Potassium 3.5 - 5.1 mmol/L 3.6 3.9 4.2  Chloride 101 - 111 mmol/L 109 110 112(H)  CO2 22 - 32 mmol/L 25 29 26   Calcium 8.9 - 10.3 mg/dL 9.7 10.0 9.5     Current Outpatient Prescriptions on File Prior to Visit  Medication Sig Dispense Refill  . aspirin EC 81 MG tablet Take 81 mg by mouth daily.    Marland Kitchen donepezil (ARICEPT) 10 MG tablet Take 10 mg by mouth daily. Daily med    . ferrous sulfate 325 (65 FE) MG tablet Take 325 mg by mouth daily with breakfast.     . glipiZIDE (GLUCOTROL) 5 MG tablet Take 5 mg by mouth daily before breakfast.    . lisinopril (PRINIVIL,ZESTRIL) 2.5 MG tablet Take 5 mg by mouth daily.     . memantine (NAMENDA) 5 MG tablet Take 5 mg by mouth 2 (two) times daily.    . simvastatin (ZOCOR) 40 MG tablet Take 40 mg by mouth at bedtime.        Past Medical History  Diagnosis Date  . Cardiac dysrhythmia, unspecified   . Syncope   . Hypothyroidism   . Hypertension   . Diabetes mellitus   . Anemia   . Depressive disorder   . Organic brain syndrome   . CAD (coronary atherosclerotic disease)    . Lung disease   . Colon cancer ([place]) 1989    Review of Systems Gen: no weight loss Resp: no cough no sputum CVS: No chest pain GI; No abdominal pina or diarrhea GU no clear dysuria   Physical Exam General: looks stable REsp: clear a/e CVS NS normal no murmers ABD: soft non tender. No liver no spleen GU No blader tenderness no CVA tenderness  Impressions:  Poorly controlled type 2 diabetes: per her HgbA1C probably no a recent phenomenon. I have increased her glyburide to 5mg  bid and added [place] 50mg  qd. Possible metformin candidate. She may soon require insulin

## 2015-12-15 ENCOUNTER — Encounter (HOSPITAL_COMMUNITY)
Admission: RE | Admit: 2015-12-15 | Discharge: 2015-12-15 | Disposition: A | Discharge status: Home / Self Care | Payer: Medicare Other | Source: Skilled Nursing Facility | Attending: Internal Medicine | Admitting: Internal Medicine

## 2015-12-15 DIAGNOSIS — E119 Type 2 diabetes mellitus without complications: Secondary | ICD-10-CM | POA: Insufficient documentation

## 2015-12-15 DIAGNOSIS — K219 Gastro-esophageal reflux disease without esophagitis: Secondary | ICD-10-CM | POA: Diagnosis present

## 2015-12-15 DIAGNOSIS — I2510 Atherosclerotic heart disease of native coronary artery without angina pectoris: Secondary | ICD-10-CM | POA: Diagnosis present

## 2015-12-15 DIAGNOSIS — I10 Essential (primary) hypertension: Secondary | ICD-10-CM | POA: Diagnosis present

## 2015-12-15 LAB — COMPREHENSIVE METABOLIC PANEL
ALT: 13 U/L — ABNORMAL LOW (ref 14–54)
ANION GAP: 8 (ref 5–15)
AST: 18 U/L (ref 15–41)
Albumin: 3.4 g/dL — ABNORMAL LOW (ref 3.5–5.0)
Alkaline Phosphatase: 95 U/L (ref 38–126)
BILIRUBIN TOTAL: 0.4 mg/dL (ref 0.3–1.2)
BUN: 30 mg/dL — AB (ref 6–20)
CALCIUM: 9.6 mg/dL (ref 8.9–10.3)
CO2: 26 mmol/L (ref 22–32)
Chloride: 108 mmol/L (ref 101–111)
Creatinine, Ser: 1.21 mg/dL — ABNORMAL HIGH (ref 0.44–1.00)
GFR calc Af Amer: 45 mL/min — ABNORMAL LOW (ref >60)
GFR calc non Af Amer: 39 mL/min — ABNORMAL LOW (ref >60)
Glucose, Bld: 175 mg/dL — ABNORMAL HIGH (ref 65–99)
POTASSIUM: 3.8 mmol/L (ref 3.5–5.1)
Sodium: 142 mmol/L (ref 135–145)
Total Protein: 7.1 g/dL (ref 6.5–8.1)

## 2015-12-15 LAB — CBC WITH DIFFERENTIAL/PLATELET
Basophils Absolute: 0 10*3/uL (ref 0.0–0.1)
Basophils Relative: 0 %
Eosinophils Absolute: 0.2 10*3/uL (ref 0.0–0.7)
Eosinophils Relative: 4 %
HCT: 33 % — ABNORMAL LOW (ref 36.0–46.0)
Hemoglobin: 10.4 g/dL — ABNORMAL LOW (ref 12.0–15.0)
Lymphocytes Relative: 38 %
Lymphs Abs: 1.8 10*3/uL (ref 0.7–4.0)
MCH: 27.4 pg (ref 26.0–34.0)
MCHC: 31.5 g/dL (ref 30.0–36.0)
MCV: 86.8 fL (ref 78.0–100.0)
MONOS PCT: 6 %
Monocytes Absolute: 0.3 10*3/uL (ref 0.1–1.0)
NEUTROS ABS: 2.4 10*3/uL (ref 1.7–7.7)
Neutrophils Relative %: 52 %
Platelets: 157 10*3/uL (ref 150–400)
RBC: 3.8 MIL/uL — ABNORMAL LOW (ref 3.87–5.11)
RDW: 15.2 % (ref 11.5–15.5)
WBC: 4.6 10*3/uL (ref 4.0–10.5)

## 2015-12-16 LAB — HEMOGLOBIN A1C
HEMOGLOBIN A1C: 8.6 % — AB (ref 4.8–5.6)
MEAN PLASMA GLUCOSE: 200 mg/dL

## 2016-01-09 ENCOUNTER — Encounter: Payer: Self-pay | Admitting: Internal Medicine

## 2016-01-09 ENCOUNTER — Non-Acute Institutional Stay (SKILLED_NURSING_FACILITY): Payer: Medicare Other | Admitting: Internal Medicine

## 2016-01-09 DIAGNOSIS — F0393 Unspecified dementia, unspecified severity, with mood disturbance: Secondary | ICD-10-CM

## 2016-01-09 DIAGNOSIS — I10 Essential (primary) hypertension: Secondary | ICD-10-CM | POA: Diagnosis not present

## 2016-01-09 DIAGNOSIS — E785 Hyperlipidemia, unspecified: Secondary | ICD-10-CM | POA: Diagnosis not present

## 2016-01-09 DIAGNOSIS — E1121 Type 2 diabetes mellitus with diabetic nephropathy: Secondary | ICD-10-CM | POA: Diagnosis not present

## 2016-01-09 DIAGNOSIS — F0390 Unspecified dementia without behavioral disturbance: Secondary | ICD-10-CM

## 2016-01-09 NOTE — Assessment & Plan Note (Addendum)
Continue the statin because of her history of cerebral artery occlusion with infarction and absence of elevation in liver enzymes

## 2016-01-09 NOTE — Progress Notes (Deleted)
Patient ID: IDAH QUIZON, female   DOB: 02-09-1925, 82 y.o.   MRN: [id number]   Location:  [place] of Service:  SNF (31)  Provider:   Code Status: *** Goals of Care:  Advanced Directives 01/09/2016  Does patient have an advance directive? Yes  Type of Advance Directive Living will  Does patient want to make changes to advanced directive? No - Patient declined  Copy of advanced directive(s) in chart? Yes  Pre-existing out of facility DNR order (yellow form or pink MOST form) -     Chief Complaint  Patient presents with  . Medical Management of Chronic Issues    HPI: Patient is a 82 y.o. female seen today for medical management of chronic diseases.     Past Medical History  Diagnosis Date  . Cardiac dysrhythmia, unspecified   . Syncope   . Hypothyroidism   . Hypertension   . Diabetes mellitus   . Anemia   . Depressive disorder   . Organic brain syndrome   . CAD (coronary atherosclerotic disease)   . Lung disease   . Colon cancer ([place]) 1989    Past Surgical History  Procedure Laterality Date  . Colon surgery    . Coronary artery bypass graft    . Lacrimal tube insertion  05/14/2011    Procedure: LACRIMAL TUBE INSERTION;  Surgeon: Williams Che;  Location: [facility];  Service: Ophthalmology;  Laterality: Right;  Placement of Stent Right Lower Canaliculus  . Colonoscopy  12/27/2008    Dr. Hilma Favors 6-mm hepatic flexure polyps removed via snare cautery/ONE mm sessile hepatic flexure polyp removed/ONE sessile 6-mm transverse colon polyp removed/ONE sessile 3-mm transverse colon polyp removed/Rare diverticula seen/. The anastomosis is approximately 10 cm above the anal verge. Tubular adenomas.   . Colonoscopy N/A 02/14/2014    Procedure: COLONOSCOPY;  Surgeon: Danie Binder, MD;  Location: [facility];  Service: Endoscopy;  Laterality: N/A;  12:45    No Known Allergies  Current Outpatient Prescriptions on File Prior to Visit  Medication Sig  Dispense Refill  . aspirin EC 81 MG tablet Take 81 mg by mouth daily.    Marland Kitchen donepezil (ARICEPT) 10 MG tablet Take 10 mg by mouth daily. Daily med    . ferrous sulfate 325 (65 FE) MG tablet Take 325 mg by mouth daily with breakfast.     . lisinopril (PRINIVIL,ZESTRIL) 2.5 MG tablet Take 5 mg by mouth daily.     . memantine (NAMENDA) 5 MG tablet Take 5 mg by mouth 2 (two) times daily.    . simvastatin (ZOCOR) 40 MG tablet Take 40 mg by mouth at bedtime.       No current facility-administered medications on file prior to visit.     Review of Systems:  Review of Systems  Health Maintenance  Topic Date Due  . FOOT EXAM  06/03/1937  . OPHTHALMOLOGY EXAM  06/03/1937  . URINE MICROALBUMIN  06/03/1937  . TETANUS/TDAP  06/03/1946  . ZOSTAVAX  06/04/1987  . DEXA SCAN  06/03/1992  . PNA vac Low Risk Adult (2 of 2 - PCV13) 06/18/2008  . INFLUENZA VACCINE  04/09/2016  . HEMOGLOBIN A1C  06/15/2016    Physical Exam: Filed Vitals:   01/09/16 1303  BP: 161/71  Pulse: 73  Temp: 95.5 F (35.3 C)  TempSrc: Oral  Resp: 24  Height: 5\' 2"  (1.575 m)  Weight: 134 lb 12.8 oz (61.145 kg)   Body mass index is 24.65  kg/(m^2). Physical Exam  Labs reviewed: Basic Metabolic Panel:  Recent Labs  08/16/15 0750 11/15/15 0710 12/15/15 0900  NA 142 141 142  K 3.9 3.6 3.8  CL 110 109 108  CO2 29 25 26   GLUCOSE 113* 189* 175*  BUN 30* 27* 30*  CREATININE 1.24* 1.25* 1.21*  CALCIUM 10.0 9.7 9.6   Liver Function Tests:  Recent Labs  08/16/15 0750 11/15/15 0710 12/15/15 0900  AST 17 25 18   ALT 15 25 13*  ALKPHOS 123 93 95  BILITOT 0.7 0.4 0.4  PROT 7.5 7.3 7.1  ALBUMIN 3.6 2.9* 3.4*   No results for input(s): LIPASE, AMYLASE in the last 8760 hours. No results for input(s): AMMONIA in the last 8760 hours. CBC:  Recent Labs  08/16/15 0750 11/15/15 0710 12/15/15 0900  WBC 4.7 8.1 4.6  NEUTROABS 1.8 5.6 2.4  HGB 11.5* 10.6* 10.4*  HCT 36.6 33.1* 33.0*  MCV 87.8 86.2 86.8  PLT  180 179 157   Lipid Panel:  Recent Labs  02/23/15 0805 08/16/15 0750  CHOL 116 126  HDL 43 49  LDLCALC 58 55  TRIG 73 110  CHOLHDL 2.7 2.6   Lab Results  Component Value Date   HGBA1C 8.6* 12/15/2015    Procedures since last visit: No results found.  Assessment/Plan There are no diagnoses linked to this encounter.   Labs/tests ordered:  @ORDERS @ Next appt:  Visit date not found       This encounter was created in error - please disregard.

## 2016-01-09 NOTE — Progress Notes (Signed)
Patient ID: Nancy [name], female   DOB: 08-Dec-1924, 82 y.o.   MRN: [id number]    This is a nursing facility mandated  follow up for chronic medical diagnoses as addressed.  Interim medical record and care since last [facility] visit was updated with review of diagnostic studies and change in clinical status since last visit were documented.  HPI: Blood pressure today 161/77 represents the high over the past 5 months. Blood pressures have ranged from 125/68 to today's high. She is on very low-dose lisinopril. She is on dual agents for dementia which is associated with depression. She is on generic Januvia, oral agent for her diabetes. Last A1c was 8.6% on 12/15/15.  She has dyslipidemia and is on simvastatin 40 mg daily. The last lipids on record were 08/16/15. Total cholesterol was 126; triglycerides 110; HDL 49; and VLDL 22. Hepatic enzymes have been normal, most recently 12/15/15.   Comprehensive review of systems: Totally negative; all answers "no'; but she is demented (see PE below). Constitutional: No fever,significant weight change, fatigue  Eyes: No redness, discharge, pain, vision change ENT/mouth: No nasal congestion,  purulent discharge, earache,change in hearing ,sore throat  Cardiovascular: No chest pain, palpitations,paroxysmal nocturnal dyspnea, claudication, edema  Respiratory: No cough, sputum production,hemoptysis, DOE , significant snoring,apnea  Gastrointestinal: No heartburn,dysphagia,abdominal pain, nausea / vomiting,rectal bleeding, melena,change in bowels Genitourinary: No dysuria,hematuria, pyuria,  incontinence, nocturia Musculoskeletal: No joint stiffness, joint swelling, weakness,pain Dermatologic: No rash, pruritus, change in appearance of skin Neurologic: No dizziness,headache,syncope, seizures, numbness , tingling Psychiatric: No significant anxiety , depression, insomnia, anorexia Endocrine: No change in hair/skin/ nails, excessive thirst, excessive  hunger, excessive urination  Hematologic/lymphatic: No significant bruising, lymphadenopathy,abnormal bleeding Allergy/immunology: No itchy/ watery eyes, significant sneezing, urticaria, angioedema  Physical exam:  Pertinent or positive findings: She has severe scattered irregular vitiligo over the face. There is marked prominence of the lower lip with suggestion of eventration. Maxilla is edentulous. She has a few remaining mandibular teeth which are heavily coated with plaque.  She has severe flexion contraction of the fourth and fifth right fingers. She stated the year was 29 and the president was [place].  General appearance:Adequately nourished; no acute distress , increased work of breathing is present.   Lymphatic: No lymphadenopathy about the head, neck, axilla . Eyes: No conjunctival inflammation or lid edema is present. There is no scleral icterus. Ears:  External ear exam shows no significant lesions or deformities.   Nose:  External nasal examination shows no deformity or inflammation. Nasal mucosa are pink and moist without lesions ,exudates Oral exam: lips and gums are healthy appearing.There is no oropharyngeal erythema or exudate . Neck:  No thyromegaly, masses, tenderness noted.    Heart:  Normal rate and regular rhythm. S1 and S2 normal without gallop, murmur, click, rub .  Lungs:Chest clear to auscultation without wheezes, rhonchi,rales , rubs. Abdomen:Bowel sounds are normal. Abdomen is soft and nontender with no organomegaly, hernias,masses. GU: deferred as previously addressed. Extremities:  No cyanosis, clubbing,edema  Neurologic exam : Cn 2-7 intact Strength equal  in upper & lower extremities Balance,Rhomberg,finger to nose testing could not be completed due to clinical state Deep tendon reflexes are equal Skin: Warm & dry w/o tenting. No significant lesions or rash.    See summary under each active problem in the Problem List with associated updated  therapeutic plan

## 2016-01-09 NOTE — Patient Instructions (Signed)
New orders for [facility] written

## 2016-01-09 NOTE — Assessment & Plan Note (Signed)
Continue dual agents

## 2016-01-09 NOTE — Assessment & Plan Note (Addendum)
BP goal = < 150/90 with PMH Increase lisinopril dose

## 2016-01-09 NOTE — Progress Notes (Signed)
This encounter was created in error - please disregard.

## 2016-01-09 NOTE — Assessment & Plan Note (Signed)
A1c is  above goal of 8% Glimiperide  1 mg daily

## 2016-01-23 ENCOUNTER — Non-Acute Institutional Stay (SKILLED_NURSING_FACILITY): Payer: Medicare Other | Admitting: Internal Medicine

## 2016-01-23 DIAGNOSIS — E1121 Type 2 diabetes mellitus with diabetic nephropathy: Secondary | ICD-10-CM | POA: Diagnosis not present

## 2016-01-23 DIAGNOSIS — I10 Essential (primary) hypertension: Secondary | ICD-10-CM | POA: Diagnosis not present

## 2016-01-23 DIAGNOSIS — N289 Disorder of kidney and ureter, unspecified: Secondary | ICD-10-CM | POA: Diagnosis not present
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
## 2016-01-24 ENCOUNTER — Encounter: Payer: Self-pay | Admitting: Internal Medicine

## 2016-01-30 ENCOUNTER — Encounter (HOSPITAL_COMMUNITY)
Admission: RE | Admit: 2016-01-30 | Discharge: 2016-01-30 | Disposition: A | Discharge status: Home / Self Care | Payer: Medicare Other | Source: Skilled Nursing Facility | Attending: Internal Medicine | Admitting: Internal Medicine

## 2016-01-30 DIAGNOSIS — E785 Hyperlipidemia, unspecified: Secondary | ICD-10-CM | POA: Diagnosis not present

## 2016-01-30 DIAGNOSIS — I2510 Atherosclerotic heart disease of native coronary artery without angina pectoris: Secondary | ICD-10-CM | POA: Insufficient documentation

## 2016-01-30 DIAGNOSIS — K219 Gastro-esophageal reflux disease without esophagitis: Secondary | ICD-10-CM | POA: Diagnosis present

## 2016-01-30 DIAGNOSIS — I10 Essential (primary) hypertension: Secondary | ICD-10-CM | POA: Insufficient documentation

## 2016-01-30 DIAGNOSIS — E119 Type 2 diabetes mellitus without complications: Secondary | ICD-10-CM | POA: Diagnosis present

## 2016-01-30 LAB — BASIC METABOLIC PANEL
ANION GAP: 5 (ref 5–15)
BUN: 29 mg/dL — ABNORMAL HIGH (ref 6–20)
CO2: 25 mmol/L (ref 22–32)
CREATININE: 1.19 mg/dL — AB (ref 0.44–1.00)
Calcium: 9.4 mg/dL (ref 8.9–10.3)
Chloride: 110 mmol/L (ref 101–111)
GFR calc Af Amer: 46 mL/min — ABNORMAL LOW (ref >60)
GFR calc non Af Amer: 40 mL/min — ABNORMAL LOW (ref >60)
GLUCOSE: 100 mg/dL — AB (ref 65–99)
Potassium: 3.8 mmol/L (ref 3.5–5.1)
Sodium: 140 mmol/L (ref 135–145)

## 2016-02-06 ENCOUNTER — Encounter: Payer: Self-pay | Admitting: Internal Medicine

## 2016-02-06 ENCOUNTER — Non-Acute Institutional Stay (SKILLED_NURSING_FACILITY): Payer: Medicare Other | Admitting: Internal Medicine

## 2016-02-06 DIAGNOSIS — I10 Essential (primary) hypertension: Secondary | ICD-10-CM

## 2016-02-06 DIAGNOSIS — F0390 Unspecified dementia without behavioral disturbance: Secondary | ICD-10-CM | POA: Diagnosis not present

## 2016-02-06 DIAGNOSIS — E1121 Type 2 diabetes mellitus with diabetic nephropathy: Secondary | ICD-10-CM | POA: Diagnosis not present

## 2016-02-06 DIAGNOSIS — N189 Chronic kidney disease, unspecified: Secondary | ICD-10-CM | POA: Diagnosis not present

## 2016-02-06 DIAGNOSIS — F0393 Unspecified dementia, unspecified severity, with mood disturbance: Secondary | ICD-10-CM
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
## 2016-02-06 NOTE — Progress Notes (Signed)
This encounter was created in error - please disregard.

## 2016-02-19 ENCOUNTER — Non-Acute Institutional Stay (SKILLED_NURSING_FACILITY): Payer: Medicare Other | Admitting: Internal Medicine

## 2016-02-19 DIAGNOSIS — H9190 Unspecified hearing loss, unspecified ear: Secondary | ICD-10-CM | POA: Diagnosis not present

## 2016-02-19 DIAGNOSIS — E1121 Type 2 diabetes mellitus with diabetic nephropathy: Secondary | ICD-10-CM | POA: Diagnosis not present

## 2016-02-19 NOTE — Assessment & Plan Note (Signed)
Clinically hearing aids are not indicated

## 2016-02-19 NOTE — Assessment & Plan Note (Signed)
BMET and hemoglobin A1c due in mid July 2017
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
## 2016-02-19 NOTE — Patient Instructions (Signed)
Hearing aids are not warranted based on my examination 02/19/16.

## 2016-02-21 ENCOUNTER — Encounter: Payer: Self-pay | Admitting: Internal Medicine

## 2016-02-21 ENCOUNTER — Non-Acute Institutional Stay (SKILLED_NURSING_FACILITY): Payer: Medicare Other | Admitting: Internal Medicine

## 2016-02-21 DIAGNOSIS — E1121 Type 2 diabetes mellitus with diabetic nephropathy: Secondary | ICD-10-CM | POA: Diagnosis not present

## 2016-02-21 DIAGNOSIS — N189 Chronic kidney disease, unspecified: Secondary | ICD-10-CM

## 2016-02-21 DIAGNOSIS — F0390 Unspecified dementia without behavioral disturbance: Secondary | ICD-10-CM | POA: Diagnosis not present

## 2016-02-21 DIAGNOSIS — I10 Essential (primary) hypertension: Secondary | ICD-10-CM

## 2016-02-21 DIAGNOSIS — F0393 Unspecified dementia, unspecified severity, with mood disturbance: Secondary | ICD-10-CM
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
## 2016-02-22 ENCOUNTER — Encounter (HOSPITAL_COMMUNITY)
Admission: RE | Admit: 2016-02-22 | Discharge: 2016-02-22 | Disposition: A | Discharge status: Home / Self Care | Payer: Medicare Other | Source: Skilled Nursing Facility | Attending: Internal Medicine | Admitting: Internal Medicine

## 2016-02-22 DIAGNOSIS — I2510 Atherosclerotic heart disease of native coronary artery without angina pectoris: Secondary | ICD-10-CM | POA: Insufficient documentation

## 2016-02-22 DIAGNOSIS — K219 Gastro-esophageal reflux disease without esophagitis: Secondary | ICD-10-CM | POA: Insufficient documentation

## 2016-02-22 DIAGNOSIS — I10 Essential (primary) hypertension: Secondary | ICD-10-CM | POA: Insufficient documentation

## 2016-02-22 DIAGNOSIS — E119 Type 2 diabetes mellitus without complications: Secondary | ICD-10-CM | POA: Diagnosis present

## 2016-02-22 LAB — BASIC METABOLIC PANEL
Anion gap: 4 — ABNORMAL LOW (ref 5–15)
BUN: 35 mg/dL — AB (ref 6–20)
CHLORIDE: 110 mmol/L (ref 101–111)
CO2: 27 mmol/L (ref 22–32)
CREATININE: 1.36 mg/dL — AB (ref 0.44–1.00)
Calcium: 9.7 mg/dL (ref 8.9–10.3)
GFR calc Af Amer: 39 mL/min — ABNORMAL LOW (ref >60)
GFR calc non Af Amer: 34 mL/min — ABNORMAL LOW (ref >60)
GLUCOSE: 101 mg/dL — AB (ref 65–99)
POTASSIUM: 3.7 mmol/L (ref 3.5–5.1)
SODIUM: 141 mmol/L (ref 135–145)

## 2016-02-27 ENCOUNTER — Encounter (HOSPITAL_COMMUNITY)
Admission: RE | Admit: 2016-02-27 | Discharge: 2016-02-27 | Disposition: A | Discharge status: Home / Self Care | Payer: Medicare Other | Source: Skilled Nursing Facility | Attending: Internal Medicine | Admitting: Internal Medicine

## 2016-02-27 DIAGNOSIS — I10 Essential (primary) hypertension: Secondary | ICD-10-CM | POA: Diagnosis not present

## 2016-02-27 LAB — BASIC METABOLIC PANEL
ANION GAP: 5 (ref 5–15)
BUN: 33 mg/dL — ABNORMAL HIGH (ref 6–20)
CALCIUM: 9.5 mg/dL (ref 8.9–10.3)
CHLORIDE: 110 mmol/L (ref 101–111)
CO2: 28 mmol/L (ref 22–32)
CREATININE: 1.27 mg/dL — AB (ref 0.44–1.00)
GFR calc non Af Amer: 37 mL/min — ABNORMAL LOW (ref >60)
GFR, EST AFRICAN AMERICAN: 42 mL/min — AB (ref >60)
Glucose, Bld: 87 mg/dL (ref 65–99)
Potassium: 3.9 mmol/L (ref 3.5–5.1)
SODIUM: 143 mmol/L (ref 135–145)

## 2016-03-01 ENCOUNTER — Encounter (HOSPITAL_COMMUNITY)
Admission: RE | Admit: 2016-03-01 | Discharge: 2016-03-01 | Disposition: A | Discharge status: Home / Self Care | Payer: Medicare Other | Source: Skilled Nursing Facility | Attending: Internal Medicine | Admitting: Internal Medicine

## 2016-03-01 DIAGNOSIS — I10 Essential (primary) hypertension: Secondary | ICD-10-CM | POA: Diagnosis not present

## 2016-03-01 LAB — BASIC METABOLIC PANEL
Anion gap: 4 — ABNORMAL LOW (ref 5–15)
BUN: 36 mg/dL — AB (ref 4–21)
BUN: 36 mg/dL — AB (ref 6–20)
CHLORIDE: 111 mmol/L (ref 101–111)
CO2: 25 mmol/L (ref 22–32)
CREATININE: 1.29 mg/dL — AB (ref 0.44–1.00)
Calcium: 9.1 mg/dL (ref 8.9–10.3)
Creatinine: 1.3 mg/dL — AB (ref <=1.1)
GFR calc Af Amer: 42 mL/min — ABNORMAL LOW (ref >60)
GFR calc non Af Amer: 36 mL/min — ABNORMAL LOW (ref >60)
GLUCOSE: 81 mg/dL (ref 65–99)
POTASSIUM: 4.2 mmol/L (ref 3.5–5.1)
SODIUM: 140 mmol/L (ref 135–145)
Sodium: 140 mmol/L (ref 137–147)

## 2016-03-26 LAB — BASIC METABOLIC PANEL
Glucose: 156 mg/dL
Glucose: 92 mg/dL

## 2016-03-27 LAB — BASIC METABOLIC PANEL: Glucose: 86 mg/dL

## 2016-03-28 ENCOUNTER — Non-Acute Institutional Stay (SKILLED_NURSING_FACILITY): Payer: Medicare Other | Admitting: Internal Medicine

## 2016-03-28 ENCOUNTER — Encounter: Payer: Self-pay | Admitting: Internal Medicine

## 2016-03-28 DIAGNOSIS — E1121 Type 2 diabetes mellitus with diabetic nephropathy: Secondary | ICD-10-CM

## 2016-03-28 DIAGNOSIS — I25119 Atherosclerotic heart disease of native coronary artery with unspecified angina pectoris: Secondary | ICD-10-CM | POA: Diagnosis not present

## 2016-03-28 DIAGNOSIS — C9110 Chronic lymphocytic leukemia of B-cell type not having achieved remission: Secondary | ICD-10-CM | POA: Diagnosis not present

## 2016-03-28 DIAGNOSIS — D126 Benign neoplasm of colon, unspecified: Secondary | ICD-10-CM | POA: Diagnosis not present

## 2016-03-28 DIAGNOSIS — K2100 Gastro-esophageal reflux disease with esophagitis, without bleeding: Secondary | ICD-10-CM

## 2016-03-28 NOTE — Patient Instructions (Signed)
New orders for [facility] entry: CBC and differential  AST,ALT CK A1c Intensity and aggressiveness of intervention in this elderly patient with multiple advanced comorbidities should be addressed at a [facility]  interdisciplinary patient care team meeting. Specifically should surveillance colonoscopies and the statin be continued.  At what point does the risk of intervention outweigh any benefits and what is the patient's decisional capacity . Medical capacity can be terminated by the use of a 4-point test as follows: #1 the patient understands the nature of intervention #2 patient understands the consequences of that decision (especially refusal of treatment ) #3 the patient is able to communicate his/her wishes. #4 those wishes are compatible with patient's known values.

## 2016-03-28 NOTE — Progress Notes (Signed)
   [facility] Room:109 Full Code  Chief Complaint  Patient presents with  . Medical Management of Chronic Issues    Medical Management of Chronic Issues   No Known Allergies   This is a nursing facility follow up of chronic medical diagnoses  Interim medical record and care since last [facility] visit was updated with review of diagnostic studies and change in clinical status since last visit were documented.  HPI:The patient has diagnoses of hypertension, coronary artery disease, history of cerebral infarction, GERD, diabetes with neuropathy, renal insufficiency, dyslipidemia, chronic lymphocytic leukemia, dementia and history of recurrent polyps. Creatinine baseline value is approximately 1.3. GFR is 42.BMET last checked 6/23. Her white blood count has varied from 4.6-8.1. Differential has been normal. Hematocrit has ranged from 33-36.6. CBC was last checked 4/7. I could not find documentation for the diagnosis of chronic lymphocytic leukemia. She has history coronary artery disease status post CABG 1992. She apparently had cardiogenic shock. She remains on a statin. Last lipids were 08/16/15. HDL & LDL were at goal. Liver function tests were normal 4/17. Last CK on record was in 2008.    Review of systems could not be completed due to dementia. She gave the date as 07/14/1947. Answers to all queries was "no".  Physical exam:  Pertinent or positive findings: She lies in the right lateral decubitus position with minimal spontaneous activity. She has scattered vitiligo over the face with perioral scarring. The maxilla is edentulous. The lower teeth are heavily plaqued. The lower lip is protuberant. Pedal pulses are decreased, especially posterior tibial pulses. She has an ankle bracelet to prevent elopement. There are flexion contractures of the fourth and fifth right fingers. General appearance: Thin but adequately nourished; no acute distress , increased work of  breathing is present.   Lymphatic: No lymphadenopathy about the head, neck, axilla . Eyes: No conjunctival inflammation or lid edema is present. There is no scleral icterus. Ears:  External ear exam shows no significant lesions or deformities.   Nose:  External nasal examination shows no deformity or inflammation. Nasal mucosa are pink and moist without lesions ,exudates Neck:  No thyromegaly, masses, tenderness noted.    Heart:  Normal rate and regular rhythm. S1 and S2 normal without gallop, murmur, click, rub .  Lungs:Chest clear to auscultation without wheezes, rhonchi,rales , rubs. Abdomen:Bowel sounds are normal. Abdomen is soft, doughy and nontender with no organomegaly GU deferred. Extremities:  No cyanosis, clubbing,edema  Neurologic exam : Strength equal  in upper decreased Balance,Rhomberg,finger to nose testing could not be completed due to clinical state Skin: Warm & dry w/o tenting. No significant lesions or rash other than vitiligo.    See summary under each active problem in the Problem List with associated updated therapeutic plan

## 2016-03-28 NOTE — Assessment & Plan Note (Addendum)
A1c

## 2016-03-28 NOTE — Assessment & Plan Note (Signed)
Recheck CBC and differential

## 2016-03-28 NOTE — Assessment & Plan Note (Signed)
CBC and differential 

## 2016-03-28 NOTE — Assessment & Plan Note (Signed)
CBC

## 2016-03-28 NOTE — Assessment & Plan Note (Signed)
Based on recent studies in the medical literature there is possible increased risk  compared to less benefit with these agents in patients over 75. LFTs WNL 4/17 Check LFTs and CK

## 2016-03-29 ENCOUNTER — Encounter (HOSPITAL_COMMUNITY)
Admission: RE | Admit: 2016-03-29 | Discharge: 2016-03-29 | Disposition: A | Discharge status: Home / Self Care | Payer: Medicare Other | Source: Skilled Nursing Facility | Attending: Internal Medicine | Admitting: Internal Medicine

## 2016-03-29 DIAGNOSIS — E119 Type 2 diabetes mellitus without complications: Secondary | ICD-10-CM | POA: Diagnosis present

## 2016-03-29 DIAGNOSIS — I2510 Atherosclerotic heart disease of native coronary artery without angina pectoris: Secondary | ICD-10-CM | POA: Insufficient documentation

## 2016-03-29 DIAGNOSIS — K219 Gastro-esophageal reflux disease without esophagitis: Secondary | ICD-10-CM | POA: Diagnosis present

## 2016-03-29 DIAGNOSIS — I10 Essential (primary) hypertension: Secondary | ICD-10-CM | POA: Insufficient documentation

## 2016-03-29 LAB — CBC WITH DIFFERENTIAL/PLATELET
Basophils Absolute: 0 10*3/uL (ref 0.0–0.1)
Basophils Relative: 1 %
EOS ABS: 0.1 10*3/uL (ref 0.0–0.7)
Eosinophils Relative: 2 %
HEMATOCRIT: 33.2 % — AB (ref 36.0–46.0)
HEMOGLOBIN: 10.6 g/dL — AB (ref 12.0–15.0)
LYMPHS ABS: 2.7 10*3/uL (ref 0.7–4.0)
Lymphocytes Relative: 53 %
MCH: 27.8 pg (ref 26.0–34.0)
MCHC: 31.9 g/dL (ref 30.0–36.0)
MCV: 87.1 fL (ref 78.0–100.0)
MONOS PCT: 8 %
Monocytes Absolute: 0.4 10*3/uL (ref 0.1–1.0)
NEUTROS PCT: 36 %
Neutro Abs: 1.8 10*3/uL (ref 1.7–7.7)
Platelets: 135 10*3/uL — ABNORMAL LOW (ref 150–400)
RBC: 3.81 MIL/uL — ABNORMAL LOW (ref 3.87–5.11)
RDW: 14.9 % (ref 11.5–15.5)
WBC: 5 10*3/uL (ref 4.0–10.5)

## 2016-03-29 LAB — ALT: ALT: 14 U/L (ref 14–54)

## 2016-03-29 LAB — CK: CK TOTAL: 44 U/L (ref 38–234)

## 2016-03-29 LAB — AST: AST: 17 U/L (ref 15–41)

## 2016-03-30 LAB — HEMOGLOBIN A1C
Hgb A1c MFr Bld: 7 % — ABNORMAL HIGH (ref 4.8–5.6)
Mean Plasma Glucose: 154 mg/dL

## 2016-04-30 ENCOUNTER — Non-Acute Institutional Stay (SKILLED_NURSING_FACILITY): Payer: Medicare Other | Admitting: Internal Medicine

## 2016-04-30 ENCOUNTER — Encounter: Payer: Self-pay | Admitting: Internal Medicine

## 2016-04-30 DIAGNOSIS — E1121 Type 2 diabetes mellitus with diabetic nephropathy: Secondary | ICD-10-CM

## 2016-04-30 DIAGNOSIS — F0390 Unspecified dementia without behavioral disturbance: Secondary | ICD-10-CM

## 2016-04-30 DIAGNOSIS — Z85038 Personal history of other malignant neoplasm of large intestine: Secondary | ICD-10-CM

## 2016-04-30 DIAGNOSIS — I10 Essential (primary) hypertension: Secondary | ICD-10-CM

## 2016-04-30 DIAGNOSIS — N189 Chronic kidney disease, unspecified: Secondary | ICD-10-CM

## 2016-04-30 DIAGNOSIS — E785 Hyperlipidemia, unspecified: Secondary | ICD-10-CM
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
## 2016-05-01 ENCOUNTER — Encounter (HOSPITAL_COMMUNITY)
Admission: RE | Admit: 2016-05-01 | Discharge: 2016-05-01 | Disposition: A | Discharge status: Home / Self Care | Payer: Medicare Other | Source: Skilled Nursing Facility | Attending: Internal Medicine | Admitting: Internal Medicine

## 2016-05-01 DIAGNOSIS — K219 Gastro-esophageal reflux disease without esophagitis: Secondary | ICD-10-CM | POA: Diagnosis not present

## 2016-05-01 DIAGNOSIS — E119 Type 2 diabetes mellitus without complications: Secondary | ICD-10-CM | POA: Diagnosis present

## 2016-05-01 LAB — BASIC METABOLIC PANEL
Anion gap: 4 — ABNORMAL LOW (ref 5–15)
BUN: 34 mg/dL — AB (ref 6–20)
CHLORIDE: 110 mmol/L (ref 101–111)
CO2: 26 mmol/L (ref 22–32)
CREATININE: 1.42 mg/dL — AB (ref 0.44–1.00)
Calcium: 9.4 mg/dL (ref 8.9–10.3)
GFR calc Af Amer: 37 mL/min — ABNORMAL LOW (ref >60)
GFR calc non Af Amer: 32 mL/min — ABNORMAL LOW (ref >60)
Glucose, Bld: 90 mg/dL (ref 65–99)
Potassium: 4 mmol/L (ref 3.5–5.1)
SODIUM: 140 mmol/L (ref 135–145)

## 2016-05-01 LAB — LIPID PANEL
Cholesterol: 124 mg/dL (ref 0–200)
HDL: 49 mg/dL (ref >40)
LDL Cholesterol: 61 mg/dL (ref 0–99)
Total CHOL/HDL Ratio: 2.5 RATIO
Triglycerides: 68 mg/dL (ref <150)
VLDL: 14 mg/dL (ref 0–40)

## 2016-05-01 LAB — TSH: TSH: 1.588 u[IU]/mL (ref 0.350–4.500)

## 2016-06-04 ENCOUNTER — Non-Acute Institutional Stay (SKILLED_NURSING_FACILITY): Payer: Medicare Other | Admitting: Internal Medicine

## 2016-06-04 ENCOUNTER — Encounter: Payer: Self-pay | Admitting: Internal Medicine

## 2016-06-04 DIAGNOSIS — I10 Essential (primary) hypertension: Secondary | ICD-10-CM | POA: Diagnosis not present

## 2016-06-04 DIAGNOSIS — D649 Anemia, unspecified: Secondary | ICD-10-CM | POA: Diagnosis not present

## 2016-06-04 DIAGNOSIS — E1121 Type 2 diabetes mellitus with diabetic nephropathy: Secondary | ICD-10-CM | POA: Diagnosis not present

## 2016-06-04 DIAGNOSIS — F0393 Unspecified dementia, unspecified severity, with mood disturbance: Secondary | ICD-10-CM

## 2016-06-04 DIAGNOSIS — F0390 Unspecified dementia without behavioral disturbance: Secondary | ICD-10-CM

## 2016-06-04 DIAGNOSIS — N183 Chronic kidney disease, stage 3 unspecified: Secondary | ICD-10-CM

## 2016-06-04 NOTE — Progress Notes (Signed)
Location:   [facility] Room Number: 109/D Place of Service:  SNF (31) Provider:  Haze Justin, MD  Patient Care Team: Hendricks Limes, MD as PCP - General (Internal Medicine) Danie Binder, MD as Consulting Physician (Gastroenterology) Virgie Dad, MD as Consulting Physician [facility])  Extended Emergency Contact Information Primary Emergency Contact: [facility] Address: [address]          [place], [place] [address] [place] Phone: [phone] Work Phone: [phone] Relation: Other  Code Status:  Full Code Goals of care: Advanced Directive information Advanced Directives 06/04/2016  Does patient have an advance directive? Yes  Type of Advance Directive (No Data)  Does patient want to make changes to advanced directive? No - Patient declined  Copy of advanced directive(s) in chart? Yes  Pre-existing out of facility DNR order (yellow form or pink MOST form) -     Chief Complaint  Patient presents with  . Medical Management of Chronic Issues    Routine Visit    HPI:  Pt is a 82 y.o. female seen today for medical management of chronic diseases. She has History of Dementia, Hypertension, Diabetes Mellitus. Type 2 Chronic Kidney disease, CAD and Cerebral artery disease. She is sitting in her wheelchair and is very pleasantly demented. She just wants to know when she can go home. No New complains. It is hard to get much reliable History from her due to her dementia.   Past Medical History:  Diagnosis Date  . Anemia   . CAD (coronary atherosclerotic disease)   . Cardiac dysrhythmia, unspecified   . Colon cancer ([facility]) 1989  . Depressive disorder   . Diabetes mellitus   . Hypertension   . Hypothyroidism   . Organic brain syndrome   . Syncope    Past Surgical History:  Procedure Laterality Date  . COLON SURGERY    . COLONOSCOPY  12/27/2008   Dr. Hilma Favors 6-mm hepatic flexure polyps  removed via snare cautery/ONE mm sessile hepatic flexure polyp removed/ONE sessile 6-mm transverse colon polyp removed/ONE sessile 3-mm transverse colon polyp removed/Rare diverticula seen/. The anastomosis is approximately 10 cm above the anal verge. Tubular adenomas.   . COLONOSCOPY N/A 02/14/2014   Procedure: COLONOSCOPY;  Surgeon: Danie Binder, MD;  [facility];  Service: Endoscopy;  Laterality: N/A;  12:45  . CORONARY ARTERY BYPASS GRAFT    . LACRIMAL TUBE INSERTION  05/14/2011   Procedure: LACRIMAL TUBE INSERTION;  Surgeon: Williams Che;  Location: [facility];  Service: Ophthalmology;  Laterality: Right;  Placement of Stent Right Lower Canaliculus    No Known Allergies  Current Outpatient Prescriptions on File Prior to Visit  Medication Sig Dispense Refill  . aspirin EC 81 MG tablet Take 81 mg by mouth daily.    Marland Kitchen donepezil (ARICEPT) 10 MG tablet Take 10 mg by mouth daily. Daily med    . ferrous sulfate 325 (65 FE) MG tablet Take 325 mg by mouth daily with breakfast.     . glimepiride (AMARYL) 1 MG tablet Take 1 tablet (1 mg total) by mouth daily with breakfast.    . lisinopril (PRINIVIL,ZESTRIL) 10 MG tablet Take 10 mg by mouth daily. Hold for systolic 99991111    . memantine (NAMENDA) 5 MG tablet Take 5 mg by mouth 2 (two) times daily.    . Nutritional Supplements (ENSURE CLEAR) LIQD Take by mouth. Give Ensure Clear by mouth 3 times a day    .  simvastatin (ZOCOR) 40 MG tablet Take 40 mg by mouth at bedtime. Hold for HLD    . sitaGLIPtin (JANUVIA) 25 MG tablet Take 25 mg by mouth daily.    Marland Kitchen ZINC OXIDE EX Apply topically. Apply  every shift prn on top of buttocks     No current facility-administered medications on file prior to visit.      Review of Systems  Unable to perform ROS: Dementia    Immunization History  Administered Date(s) Administered  . Influenza-Unspecified 06/16/2014  . Pneumococcal Polysaccharide-23 06/19/2007   Pertinent  Health Maintenance Due    Topic Date Due  . INFLUENZA VACCINE  08/09/2016 (Originally 04/09/2016)  . FOOT EXAM  02/05/2017 (Originally 06/03/1935)  . OPHTHALMOLOGY EXAM  02/05/2017 (Originally 06/03/1935)  . DEXA SCAN  02/05/2017 (Originally 06/03/1992)  . PNA vac Low Risk Adult (2 of 2 - PCV13) 02/05/2017 (Originally 06/18/2008)  . URINE MICROALBUMIN  06/04/2017 (Originally 06/03/1935)  . HEMOGLOBIN A1C  09/29/2016   No flowsheet data found. Functional Status Survey:    Vitals:   06/04/16 1005  BP: 132/73  Pulse: 68  Resp: 20  Temp: 97.7 F (36.5 C)  TempSrc: Oral  SpO2: 99%   There is no height or weight on file to calculate BMI. Physical Exam  Constitutional: She appears well-developed.  HENT:  Head: Normocephalic.  Poor Dental Hygiene  Cardiovascular: S1 normal and S2 normal.  An irregular rhythm present. Bradycardia present.   Pulmonary/Chest: Effort normal and breath sounds normal. No respiratory distress. She has no wheezes. She has no rales.  Abdominal: Soft. Bowel sounds are normal. She exhibits no distension. There is no tenderness. There is no rebound.  Musculoskeletal: She exhibits no edema.  Neurological: She is alert.  Oriented to [place] not oriented to time or person.,    Labs reviewed:  Recent Labs  02/27/16 0705 03/01/16 03/01/16 0755 05/01/16 0700  NA 143 140 140 140  K 3.9  --  4.2 4.0  CL 110  --  111 110  CO2 28  --  25 26  GLUCOSE 87  --  81 90  BUN 33* 36* 36* 34*  CREATININE 1.27* 1.3* 1.29* 1.42*  CALCIUM 9.5  --  9.1 9.4    Recent Labs  08/16/15 0750 11/15/15 0710 12/15/15 0900 03/29/16 0740  AST 17 25 18 17   ALT 15 25 13* 14  ALKPHOS 123 93 95  --   BILITOT 0.7 0.4 0.4  --   PROT 7.5 7.3 7.1  --   ALBUMIN 3.6 2.9* 3.4*  --     Recent Labs  11/15/15 0710 12/15/15 0900 03/29/16 0740  WBC 8.1 4.6 5.0  NEUTROABS 5.6 2.4 1.8  HGB 10.6* 10.4* 10.6*  HCT 33.1* 33.0* 33.2*  MCV 86.2 86.8 87.1  PLT 179 157 135*   Lab Results  Component Value Date    TSH 1.588 05/01/2016   Lab Results  Component Value Date   HGBA1C 7.0 (H) 03/29/2016   Lab Results  Component Value Date   CHOL 124 05/01/2016   HDL 49 05/01/2016   LDLCALC 61 05/01/2016   TRIG 68 05/01/2016   CHOLHDL 2.5 05/01/2016    Significant Diagnostic Results in last 30 days:  No results found.  Assessment/Plan  Type 2 diabetes with nephropathy   Patient BS has been very stable on Amaryl and Januvia. Her mostly 120-160. Her Last Hga1c was 7.1in 03/2016. Will continue same dose for now. Will change accu checks to once a day.  Essential hypertension Blood pressure stable. Continue lisinopril  Chronic kidney disease, stage 3 (moderate)  Last Creat was 1.4. Continue to monitor.  Senile dementia Patient Doing well on Namenda and Aricept.  Anemia Hgb stable  No further W/U needed right now.  Hyperlipidemia Continue Zocor.   Family/ staff Communication:   Labs/tests ordered:

## 2016-07-03 ENCOUNTER — Non-Acute Institutional Stay (SKILLED_NURSING_FACILITY): Payer: Medicare Other | Admitting: Internal Medicine

## 2016-07-03 ENCOUNTER — Encounter: Payer: Self-pay | Admitting: Internal Medicine

## 2016-07-03 DIAGNOSIS — E1121 Type 2 diabetes mellitus with diabetic nephropathy: Secondary | ICD-10-CM | POA: Diagnosis not present

## 2016-07-03 DIAGNOSIS — I25119 Atherosclerotic heart disease of native coronary artery with unspecified angina pectoris: Secondary | ICD-10-CM | POA: Diagnosis not present

## 2016-07-03 DIAGNOSIS — I209 Angina pectoris, unspecified: Secondary | ICD-10-CM | POA: Diagnosis not present

## 2016-07-03 DIAGNOSIS — I10 Essential (primary) hypertension: Secondary | ICD-10-CM | POA: Diagnosis not present

## 2016-07-03 DIAGNOSIS — N183 Chronic kidney disease, stage 3 unspecified: Secondary | ICD-10-CM

## 2016-07-03 DIAGNOSIS — F0390 Unspecified dementia without behavioral disturbance: Secondary | ICD-10-CM

## 2016-07-03 DIAGNOSIS — D649 Anemia, unspecified: Secondary | ICD-10-CM

## 2016-07-03 DIAGNOSIS — E785 Hyperlipidemia, unspecified: Secondary | ICD-10-CM | POA: Diagnosis not present

## 2016-07-03 DIAGNOSIS — I6350 Cerebral infarction due to unspecified occlusion or stenosis of unspecified cerebral artery: Secondary | ICD-10-CM

## 2016-07-03 DIAGNOSIS — F0393 Unspecified dementia, unspecified severity, with mood disturbance: Secondary | ICD-10-CM
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
## 2016-07-04 ENCOUNTER — Encounter (HOSPITAL_COMMUNITY)
Admission: RE | Admit: 2016-07-04 | Discharge: 2016-07-04 | Disposition: A | Discharge status: Home / Self Care | Payer: Medicare Other | Source: Skilled Nursing Facility | Attending: Internal Medicine | Admitting: Internal Medicine

## 2016-07-04 DIAGNOSIS — E119 Type 2 diabetes mellitus without complications: Secondary | ICD-10-CM | POA: Diagnosis present

## 2016-07-04 DIAGNOSIS — I2510 Atherosclerotic heart disease of native coronary artery without angina pectoris: Secondary | ICD-10-CM | POA: Insufficient documentation

## 2016-07-04 DIAGNOSIS — K219 Gastro-esophageal reflux disease without esophagitis: Secondary | ICD-10-CM | POA: Diagnosis present

## 2016-07-04 DIAGNOSIS — I10 Essential (primary) hypertension: Secondary | ICD-10-CM | POA: Diagnosis present

## 2016-07-04 LAB — CBC WITH DIFFERENTIAL/PLATELET
BASOS PCT: 1 %
Basophils Absolute: 0 10*3/uL (ref 0.0–0.1)
EOS ABS: 0.1 10*3/uL (ref 0.0–0.7)
Eosinophils Relative: 2 %
HEMATOCRIT: 35.7 % — AB (ref 36.0–46.0)
Hemoglobin: 11.3 g/dL — ABNORMAL LOW (ref 12.0–15.0)
Lymphocytes Relative: 42 %
Lymphs Abs: 2 10*3/uL (ref 0.7–4.0)
MCH: 28.3 pg (ref 26.0–34.0)
MCHC: 31.7 g/dL (ref 30.0–36.0)
MCV: 89.5 fL (ref 78.0–100.0)
Monocytes Absolute: 0.5 10*3/uL (ref 0.1–1.0)
Monocytes Relative: 10 %
NEUTROS PCT: 45 %
Neutro Abs: 2.1 10*3/uL (ref 1.7–7.7)
Platelets: 153 10*3/uL (ref 150–400)
RBC: 3.99 MIL/uL (ref 3.87–5.11)
RDW: 14.4 % (ref 11.5–15.5)
WBC: 4.7 10*3/uL (ref 4.0–10.5)

## 2016-07-04 LAB — COMPREHENSIVE METABOLIC PANEL
ALBUMIN: 3.7 g/dL (ref 3.5–5.0)
ALT: 15 U/L (ref 14–54)
ANION GAP: 5 (ref 5–15)
AST: 19 U/L (ref 15–41)
Alkaline Phosphatase: 79 U/L (ref 38–126)
BUN: 40 mg/dL — AB (ref 6–20)
CALCIUM: 9.9 mg/dL (ref 8.9–10.3)
CO2: 27 mmol/L (ref 22–32)
Chloride: 109 mmol/L (ref 101–111)
Creatinine, Ser: 1.53 mg/dL — ABNORMAL HIGH (ref 0.44–1.00)
GFR calc Af Amer: 34 mL/min — ABNORMAL LOW (ref >60)
GFR calc non Af Amer: 29 mL/min — ABNORMAL LOW (ref >60)
GLUCOSE: 149 mg/dL — AB (ref 65–99)
Potassium: 4.1 mmol/L (ref 3.5–5.1)
SODIUM: 141 mmol/L (ref 135–145)
Total Bilirubin: 0.4 mg/dL (ref 0.3–1.2)
Total Protein: 7.4 g/dL (ref 6.5–8.1)

## 2016-07-05 LAB — HEMOGLOBIN A1C
HEMOGLOBIN A1C: 7.1 % — AB (ref 4.8–5.6)
Mean Plasma Glucose: 157 mg/dL

## 2016-07-29 ENCOUNTER — Encounter: Payer: Self-pay | Admitting: Internal Medicine

## 2016-07-29 ENCOUNTER — Non-Acute Institutional Stay (SKILLED_NURSING_FACILITY): Payer: Medicare Other | Admitting: Internal Medicine

## 2016-07-29 DIAGNOSIS — N183 Chronic kidney disease, stage 3 unspecified: Secondary | ICD-10-CM

## 2016-07-29 DIAGNOSIS — E1121 Type 2 diabetes mellitus with diabetic nephropathy: Secondary | ICD-10-CM | POA: Diagnosis not present

## 2016-07-29 DIAGNOSIS — I10 Essential (primary) hypertension: Secondary | ICD-10-CM

## 2016-07-29 DIAGNOSIS — F0390 Unspecified dementia without behavioral disturbance: Secondary | ICD-10-CM | POA: Diagnosis not present

## 2016-07-29 DIAGNOSIS — E785 Hyperlipidemia, unspecified: Secondary | ICD-10-CM | POA: Diagnosis not present

## 2016-07-29 DIAGNOSIS — D649 Anemia, unspecified: Secondary | ICD-10-CM | POA: Diagnosis not present

## 2016-07-29 DIAGNOSIS — F0393 Unspecified dementia, unspecified severity, with mood disturbance: Secondary | ICD-10-CM

## 2016-07-29 NOTE — Progress Notes (Signed)
Location:   [place] Room Number: 109/D Place of Service:  SNF (31) Provider:  Geralynn Capri,Ethell Blatchford  No primary care provider on file.  Patient Care Team: Danie Binder, MD as Consulting Physician (Gastroenterology) Virgie Dad, MD as Consulting Physician (Geriatric Medicine)  Extended Emergency Contact Information Primary Emergency Contact: [facility] Address: [address]          [place], [place] [address] [place] Phone: [phone] Work Phone: [phone] Relation: Other  Code Status:  Full Code Goals of care: Advanced Directive information Advanced Directives 07/29/2016  Does patient have an advance directive? Yes  Type of Advance Directive (No Data)  Does patient want to make changes to advanced directive? No - Patient declined  Copy of advanced directive(s) in chart? Yes  Pre-existing out of facility DNR order (yellow form or pink MOST form) -     Chief Complaint  Patient presents with  . Medical Management of Chronic Issues    Routine Visit    HPI:  Pt is a 82 y.o. female seen today for medical management of chronic diseases.  Patient has h/o dementia, hypertension, Diabetes mellitus,  CKD stage 3, CAD S/P CABG  and Cerebral artery occlusion and She also has a history of colon cancer back and then late 1980s with a left hemicolectomy and colonoscopy in 2010 that showed tubular adenomas-had a colonoscopy about 2 years ago that didn't show any acute findings did show a polyp which was removed and mild diverticulosis in the transverse colon.  She also has a history of a lung nodule with long-term stability iand radiology in the past has recommended no further follow-up secondary to its stability   Patient has been long term nursing home patient. She has been stable in the facility. Her weight is stable at 143 lbs. Patient is unable to give any history. She says no for everything. Is able to perform simple  commands. Her BS have been 124 to 118.   Past Medical History:  Diagnosis Date  . Anemia   . CAD (coronary atherosclerotic disease)   . Cardiac dysrhythmia, unspecified   . Colon cancer ([place]) 1989  . Depressive disorder   . Diabetes mellitus   . Hypertension   . Hypothyroidism   . Organic brain syndrome   . Syncope    Past Surgical History:  Procedure Laterality Date  . COLON SURGERY    . COLONOSCOPY  12/27/2008   Dr. Hilma Favors 6-mm hepatic flexure polyps removed via snare cautery/ONE mm sessile hepatic flexure polyp removed/ONE sessile 6-mm transverse colon polyp removed/ONE sessile 3-mm transverse colon polyp removed/Rare diverticula seen/. The anastomosis is approximately 10 cm above the anal verge. Tubular adenomas.   . COLONOSCOPY N/A 02/14/2014   Procedure: COLONOSCOPY;  Surgeon: Danie Binder, MD;  [facility];  Service: Endoscopy;  Laterality: N/A;  12:45  . CORONARY ARTERY BYPASS GRAFT    . LACRIMAL TUBE INSERTION  05/14/2011   Procedure: LACRIMAL TUBE INSERTION;  Surgeon: Williams Che;  Location: [facility];  Service: Ophthalmology;  Laterality: Right;  Placement of Stent Right Lower Canaliculus    No Known Allergies  Current Outpatient Prescriptions on File Prior to Visit  Medication Sig Dispense Refill  . aspirin EC 81 MG tablet Take 81 mg by mouth daily.    Marland Kitchen donepezil (ARICEPT) 10 MG tablet Take 10 mg by mouth daily. Daily med    . ferrous sulfate 325 (65 FE) MG tablet Take 325  mg by mouth daily with breakfast.     . glimepiride (AMARYL) 1 MG tablet Take 1 tablet (1 mg total) by mouth daily with breakfast.    . lisinopril (PRINIVIL,ZESTRIL) 10 MG tablet Take 10 mg by mouth daily. Hold for systolic 99991111    . memantine (NAMENDA) 5 MG tablet Take 5 mg by mouth 2 (two) times daily.    . simvastatin (ZOCOR) 40 MG tablet Take 40 mg by mouth at bedtime. Hold for HLD    . sitaGLIPtin (JANUVIA) 25 MG tablet Take 25 mg by mouth daily.    Marland Kitchen ZINC OXIDE EX Apply  topically. Apply  every shift prn on top of buttocks     No current facility-administered medications on file prior to visit.     Review of Systems  Unable to perform ROS: Dementia    Immunization History  Administered Date(s) Administered  . Influenza-Unspecified 06/16/2014  . Pneumococcal Polysaccharide-23 06/19/2007  . Pneumococcal-Unspecified 06/21/2016   Pertinent  Health Maintenance Due  Topic Date Due  . INFLUENZA VACCINE  08/09/2016 (Originally 04/09/2016)  . FOOT EXAM  02/05/2017 (Originally 06/03/1935)  . OPHTHALMOLOGY EXAM  02/05/2017 (Originally 06/03/1935)  . DEXA SCAN  02/05/2017 (Originally 06/03/1992)  . URINE MICROALBUMIN  06/04/2017 (Originally 06/03/1935)  . HEMOGLOBIN A1C  01/02/2017  . PNA vac Low Risk Adult (2 of 2 - PCV13) 06/21/2017   No flowsheet data found. Functional Status Survey:    Vitals:   07/29/16 1530  BP: (!) 145/67  Pulse: (!) 56  Resp: 18  Temp: 98.1 F (36.7 C)  TempSrc: Oral  SpO2: 100%   There is no height or weight on file to calculate BMI. Physical Exam  Constitutional: She appears well-developed and well-nourished.  HENT:  Head: Normocephalic.  Mouth/Throat: Oropharynx is clear and moist.  Cardiovascular: Normal rate, regular rhythm and normal heart sounds.   Pulmonary/Chest: Effort normal and breath sounds normal. No respiratory distress. She has no wheezes. She has no rales.  Abdominal: Soft. Bowel sounds are normal. She exhibits no distension. There is no tenderness. There is no rebound and no guarding.  Musculoskeletal: She exhibits no edema.  Neurological: She is alert.  Not oriented. Thinks it is 4 and March.    Labs reviewed:  Recent Labs  03/01/16 0755 05/01/16 0700 07/04/16 0700  NA 140 140 141  K 4.2 4.0 4.1  CL 111 110 109  CO2 25 26 27   GLUCOSE 81 90 149*  BUN 36* 34* 40*  CREATININE 1.29* 1.42* 1.53*  CALCIUM 9.1 9.4 9.9    Recent Labs  11/15/15 0710 12/15/15 0900 03/29/16 0740  07/04/16 0700  AST 25 18 17 19   ALT 25 13* 14 15  ALKPHOS 93 95  --  79  BILITOT 0.4 0.4  --  0.4  PROT 7.3 7.1  --  7.4  ALBUMIN 2.9* 3.4*  --  3.7    Recent Labs  12/15/15 0900 03/29/16 0740 07/04/16 0700  WBC 4.6 5.0 4.7  NEUTROABS 2.4 1.8 2.1  HGB 10.4* 10.6* 11.3*  HCT 33.0* 33.2* 35.7*  MCV 86.8 87.1 89.5  PLT 157 135* 153   Lab Results  Component Value Date   TSH 1.588 05/01/2016   Lab Results  Component Value Date   HGBA1C 7.1 (H) 07/04/2016   Lab Results  Component Value Date   CHOL 124 05/01/2016   HDL 49 05/01/2016   LDLCALC 61 05/01/2016   TRIG 68 05/01/2016   CHOLHDL 2.5 05/01/2016  Significant Diagnostic Results in last 30 days:  No results found.  Assessment/Plan  Stage 3 chronic kidney disease  In recent labs her BUN has gone up to 40 and creat is 1.53. Patient is on low dose of Lotensin. Will repeat BMP.   Essential hypertension BP slightly high today but usually her systolic runs less then [id number]. Continue Lotensin and monitor.  Type 2 diabetes with nephropathy  Last A1C is 7.1 in 10/17 Continue Januvia and Glimepiride.  Anemia HGB stable Continue iron supplement   Hyperlipidemia, LDL 61 Continue Zocor  Senile dementia with depression without behavioral disturbance Patient doing well on Namenda and Aricept.     Family/ staff Communication:   Labs/tests ordered:  Recheck BMP to follow BUN.

## 2016-07-30 ENCOUNTER — Encounter (HOSPITAL_COMMUNITY)
Admission: RE | Admit: 2016-07-30 | Discharge: 2016-07-30 | Disposition: A | Discharge status: Home / Self Care | Payer: Medicare Other | Source: Skilled Nursing Facility | Attending: Internal Medicine | Admitting: Internal Medicine

## 2016-07-30 DIAGNOSIS — I2510 Atherosclerotic heart disease of native coronary artery without angina pectoris: Secondary | ICD-10-CM | POA: Diagnosis present

## 2016-07-30 DIAGNOSIS — E119 Type 2 diabetes mellitus without complications: Secondary | ICD-10-CM | POA: Insufficient documentation

## 2016-07-30 DIAGNOSIS — I10 Essential (primary) hypertension: Secondary | ICD-10-CM | POA: Diagnosis present

## 2016-07-30 DIAGNOSIS — K219 Gastro-esophageal reflux disease without esophagitis: Secondary | ICD-10-CM | POA: Insufficient documentation

## 2016-07-30 LAB — BASIC METABOLIC PANEL
Anion gap: 6 (ref 5–15)
BUN: 28 mg/dL — AB (ref 6–20)
CHLORIDE: 106 mmol/L (ref 101–111)
CO2: 28 mmol/L (ref 22–32)
CREATININE: 1.25 mg/dL — AB (ref 0.44–1.00)
Calcium: 10.2 mg/dL (ref 8.9–10.3)
GFR calc Af Amer: 43 mL/min — ABNORMAL LOW (ref >60)
GFR calc non Af Amer: 37 mL/min — ABNORMAL LOW (ref >60)
Glucose, Bld: 98 mg/dL (ref 65–99)
POTASSIUM: 3.9 mmol/L (ref 3.5–5.1)
Sodium: 140 mmol/L (ref 135–145)

## 2016-07-30 LAB — BRAIN NATRIURETIC PEPTIDE: B Natriuretic Peptide: 115 pg/mL — ABNORMAL HIGH (ref 0.0–100.0)

## 2016-08-27 ENCOUNTER — Non-Acute Institutional Stay (SKILLED_NURSING_FACILITY): Payer: Medicare Other | Admitting: Internal Medicine

## 2016-08-27 ENCOUNTER — Encounter: Payer: Self-pay | Admitting: Internal Medicine

## 2016-08-27 DIAGNOSIS — F0390 Unspecified dementia without behavioral disturbance: Secondary | ICD-10-CM

## 2016-08-27 DIAGNOSIS — I209 Angina pectoris, unspecified: Secondary | ICD-10-CM

## 2016-08-27 DIAGNOSIS — I25119 Atherosclerotic heart disease of native coronary artery with unspecified angina pectoris: Secondary | ICD-10-CM

## 2016-08-27 DIAGNOSIS — N183 Chronic kidney disease, stage 3 unspecified: Secondary | ICD-10-CM

## 2016-08-27 DIAGNOSIS — I10 Essential (primary) hypertension: Secondary | ICD-10-CM | POA: Diagnosis not present

## 2016-08-27 DIAGNOSIS — E1121 Type 2 diabetes mellitus with diabetic nephropathy: Secondary | ICD-10-CM | POA: Diagnosis not present

## 2016-08-27 DIAGNOSIS — F0393 Unspecified dementia, unspecified severity, with mood disturbance: Secondary | ICD-10-CM
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
## 2016-08-28 ENCOUNTER — Encounter (HOSPITAL_COMMUNITY)
Admission: RE | Admit: 2016-08-28 | Discharge: 2016-08-28 | Disposition: A | Discharge status: Home / Self Care | Payer: Medicare Other | Source: Skilled Nursing Facility | Attending: Internal Medicine | Admitting: Internal Medicine

## 2016-08-28 DIAGNOSIS — I10 Essential (primary) hypertension: Secondary | ICD-10-CM | POA: Diagnosis not present

## 2016-08-28 DIAGNOSIS — I2510 Atherosclerotic heart disease of native coronary artery without angina pectoris: Secondary | ICD-10-CM | POA: Diagnosis present

## 2016-08-28 DIAGNOSIS — E119 Type 2 diabetes mellitus without complications: Secondary | ICD-10-CM | POA: Diagnosis present

## 2016-08-28 DIAGNOSIS — K219 Gastro-esophageal reflux disease without esophagitis: Secondary | ICD-10-CM | POA: Diagnosis present

## 2016-08-28 LAB — BASIC METABOLIC PANEL
ANION GAP: 4 — AB (ref 5–15)
BUN: 32 mg/dL — AB (ref 6–20)
CALCIUM: 9.8 mg/dL (ref 8.9–10.3)
CO2: 26 mmol/L (ref 22–32)
Chloride: 112 mmol/L — ABNORMAL HIGH (ref 101–111)
Creatinine, Ser: 1.3 mg/dL — ABNORMAL HIGH (ref 0.44–1.00)
GFR calc Af Amer: 41 mL/min — ABNORMAL LOW (ref >60)
GFR calc non Af Amer: 35 mL/min — ABNORMAL LOW (ref >60)
Glucose, Bld: 92 mg/dL (ref 65–99)
Potassium: 3.6 mmol/L (ref 3.5–5.1)
SODIUM: 142 mmol/L (ref 135–145)

## 2016-08-28 LAB — CBC WITH DIFFERENTIAL/PLATELET
BASOS ABS: 0 10*3/uL (ref 0.0–0.1)
Basophils Relative: 1 %
EOS ABS: 0.1 10*3/uL (ref 0.0–0.7)
EOS PCT: 3 %
HCT: 33 % — ABNORMAL LOW (ref 36.0–46.0)
Hemoglobin: 10.4 g/dL — ABNORMAL LOW (ref 12.0–15.0)
LYMPHS PCT: 48 %
Lymphs Abs: 2.4 10*3/uL (ref 0.7–4.0)
MCH: 28.1 pg (ref 26.0–34.0)
MCHC: 31.5 g/dL (ref 30.0–36.0)
MCV: 89.2 fL (ref 78.0–100.0)
MONO ABS: 0.4 10*3/uL (ref 0.1–1.0)
Monocytes Relative: 8 %
Neutro Abs: 2 10*3/uL (ref 1.7–7.7)
Neutrophils Relative %: 40 %
PLATELETS: 152 10*3/uL (ref 150–400)
RBC: 3.7 MIL/uL — ABNORMAL LOW (ref 3.87–5.11)
RDW: 14.4 % (ref 11.5–15.5)
WBC: 4.9 10*3/uL (ref 4.0–10.5)

## 2016-10-10 ENCOUNTER — Inpatient Hospital Stay (HOSPITAL_COMMUNITY)
Admission: EM | Admit: 2016-10-10 | Discharge: 2016-10-15 | DRG: 064 | Disposition: A | Discharge status: Skilled Nursing Facility | Payer: Medicare Other | Attending: Internal Medicine | Admitting: Internal Medicine

## 2016-10-10 ENCOUNTER — Observation Stay (HOSPITAL_COMMUNITY): Payer: Medicare Other

## 2016-10-10 ENCOUNTER — Encounter: Payer: Self-pay | Admitting: Internal Medicine

## 2016-10-10 ENCOUNTER — Emergency Department (HOSPITAL_COMMUNITY): Payer: Medicare Other

## 2016-10-10 ENCOUNTER — Non-Acute Institutional Stay (SKILLED_NURSING_FACILITY): Payer: Medicare Other | Admitting: Internal Medicine

## 2016-10-10 ENCOUNTER — Encounter (HOSPITAL_COMMUNITY): Payer: Self-pay | Admitting: Emergency Medicine

## 2016-10-10 DIAGNOSIS — Z951 Presence of aortocoronary bypass graft: Secondary | ICD-10-CM

## 2016-10-10 DIAGNOSIS — G8191 Hemiplegia, unspecified affecting right dominant side: Secondary | ICD-10-CM

## 2016-10-10 DIAGNOSIS — D72829 Elevated white blood cell count, unspecified: Secondary | ICD-10-CM | POA: Diagnosis present

## 2016-10-10 DIAGNOSIS — T68XXXA Hypothermia, initial encounter: Secondary | ICD-10-CM | POA: Diagnosis not present

## 2016-10-10 DIAGNOSIS — R4701 Aphasia: Secondary | ICD-10-CM | POA: Diagnosis not present

## 2016-10-10 DIAGNOSIS — R7989 Other specified abnormal findings of blood chemistry: Secondary | ICD-10-CM

## 2016-10-10 DIAGNOSIS — N183 Chronic kidney disease, stage 3 unspecified: Secondary | ICD-10-CM

## 2016-10-10 DIAGNOSIS — C9110 Chronic lymphocytic leukemia of B-cell type not having achieved remission: Secondary | ICD-10-CM | POA: Diagnosis not present

## 2016-10-10 DIAGNOSIS — E039 Hypothyroidism, unspecified: Secondary | ICD-10-CM | POA: Diagnosis not present

## 2016-10-10 DIAGNOSIS — I4891 Unspecified atrial fibrillation: Secondary | ICD-10-CM | POA: Diagnosis present

## 2016-10-10 DIAGNOSIS — Z79899 Other long term (current) drug therapy: Secondary | ICD-10-CM

## 2016-10-10 DIAGNOSIS — Z515 Encounter for palliative care: Secondary | ICD-10-CM | POA: Diagnosis present

## 2016-10-10 DIAGNOSIS — R748 Abnormal levels of other serum enzymes: Secondary | ICD-10-CM | POA: Diagnosis not present

## 2016-10-10 DIAGNOSIS — E1165 Type 2 diabetes mellitus with hyperglycemia: Secondary | ICD-10-CM | POA: Diagnosis present

## 2016-10-10 DIAGNOSIS — Z7401 Bed confinement status: Secondary | ICD-10-CM

## 2016-10-10 DIAGNOSIS — I63511 Cerebral infarction due to unspecified occlusion or stenosis of right middle cerebral artery: Secondary | ICD-10-CM | POA: Diagnosis not present

## 2016-10-10 DIAGNOSIS — I63411 Cerebral infarction due to embolism of right middle cerebral artery: Secondary | ICD-10-CM

## 2016-10-10 DIAGNOSIS — I639 Cerebral infarction, unspecified: Secondary | ICD-10-CM | POA: Diagnosis not present

## 2016-10-10 DIAGNOSIS — I214 Non-ST elevation (NSTEMI) myocardial infarction: Secondary | ICD-10-CM | POA: Diagnosis not present

## 2016-10-10 DIAGNOSIS — I159 Secondary hypertension, unspecified: Secondary | ICD-10-CM | POA: Diagnosis not present

## 2016-10-10 DIAGNOSIS — E785 Hyperlipidemia, unspecified: Secondary | ICD-10-CM | POA: Diagnosis not present

## 2016-10-10 DIAGNOSIS — Z7984 Long term (current) use of oral hypoglycemic drugs: Secondary | ICD-10-CM

## 2016-10-10 DIAGNOSIS — Z993 Dependence on wheelchair: Secondary | ICD-10-CM

## 2016-10-10 DIAGNOSIS — I6789 Other cerebrovascular disease: Secondary | ICD-10-CM | POA: Diagnosis not present

## 2016-10-10 DIAGNOSIS — E1122 Type 2 diabetes mellitus with diabetic chronic kidney disease: Secondary | ICD-10-CM | POA: Diagnosis present

## 2016-10-10 DIAGNOSIS — R0600 Dyspnea, unspecified: Secondary | ICD-10-CM

## 2016-10-10 DIAGNOSIS — N179 Acute kidney failure, unspecified: Secondary | ICD-10-CM

## 2016-10-10 DIAGNOSIS — R4182 Altered mental status, unspecified: Secondary | ICD-10-CM | POA: Diagnosis not present

## 2016-10-10 DIAGNOSIS — E875 Hyperkalemia: Secondary | ICD-10-CM | POA: Diagnosis present

## 2016-10-10 DIAGNOSIS — Z7982 Long term (current) use of aspirin: Secondary | ICD-10-CM | POA: Diagnosis not present

## 2016-10-10 DIAGNOSIS — Z66 Do not resuscitate: Secondary | ICD-10-CM | POA: Diagnosis present

## 2016-10-10 DIAGNOSIS — F0390 Unspecified dementia without behavioral disturbance: Secondary | ICD-10-CM | POA: Diagnosis present

## 2016-10-10 DIAGNOSIS — I63423 Cerebral infarction due to embolism of bilateral anterior cerebral arteries: Secondary | ICD-10-CM | POA: Diagnosis not present

## 2016-10-10 DIAGNOSIS — G8190 Hemiplegia, unspecified affecting unspecified side: Secondary | ICD-10-CM

## 2016-10-10 DIAGNOSIS — Z85038 Personal history of other malignant neoplasm of large intestine: Secondary | ICD-10-CM | POA: Diagnosis not present

## 2016-10-10 DIAGNOSIS — R531 Weakness: Secondary | ICD-10-CM

## 2016-10-10 DIAGNOSIS — E870 Hyperosmolality and hypernatremia: Secondary | ICD-10-CM | POA: Diagnosis not present

## 2016-10-10 DIAGNOSIS — I248 Other forms of acute ischemic heart disease: Secondary | ICD-10-CM | POA: Diagnosis present

## 2016-10-10 DIAGNOSIS — I2510 Atherosclerotic heart disease of native coronary artery without angina pectoris: Secondary | ICD-10-CM | POA: Diagnosis not present

## 2016-10-10 DIAGNOSIS — E118 Type 2 diabetes mellitus with unspecified complications: Secondary | ICD-10-CM

## 2016-10-10 DIAGNOSIS — R778 Other specified abnormalities of plasma proteins: Secondary | ICD-10-CM | POA: Insufficient documentation

## 2016-10-10 DIAGNOSIS — I10 Essential (primary) hypertension: Secondary | ICD-10-CM | POA: Diagnosis not present

## 2016-10-10 LAB — URINALYSIS, ROUTINE W REFLEX MICROSCOPIC
BACTERIA UA: NONE SEEN
BILIRUBIN URINE: NEGATIVE
Glucose, UA: >=500 mg/dL — AB
Hgb urine dipstick: NEGATIVE
KETONES UR: NEGATIVE mg/dL
LEUKOCYTES UA: NEGATIVE
NITRITE: NEGATIVE
PH: 7 (ref 5.0–8.0)
Protein, ur: 100 mg/dL — AB
Specific Gravity, Urine: 1.014 (ref 1.005–1.030)

## 2016-10-10 LAB — DIFFERENTIAL
Basophils Absolute: 0 K/uL (ref 0.0–0.1)
Basophils Relative: 1 %
Eosinophils Absolute: 0.1 K/uL (ref 0.0–0.7)
Eosinophils Relative: 1 %
Lymphocytes Relative: 47 %
Lymphs Abs: 3.1 K/uL (ref 0.7–4.0)
Monocytes Absolute: 0.4 K/uL (ref 0.1–1.0)
Monocytes Relative: 6 %
Neutro Abs: 3 K/uL (ref 1.7–7.7)
Neutrophils Relative %: 45 %

## 2016-10-10 LAB — AMMONIA: Ammonia: 22 umol/L (ref 9–35)

## 2016-10-10 LAB — CBC
HCT: 38.9 % (ref 36.0–46.0)
HEMOGLOBIN: 12.5 g/dL (ref 12.0–15.0)
MCH: 28.5 pg (ref 26.0–34.0)
MCHC: 32.1 g/dL (ref 30.0–36.0)
MCV: 88.6 fL (ref 78.0–100.0)
Platelets: 144 10*3/uL — ABNORMAL LOW (ref 150–400)
RBC: 4.39 MIL/uL (ref 3.87–5.11)
RDW: 14.3 % (ref 11.5–15.5)
WBC: 6.6 10*3/uL (ref 4.0–10.5)

## 2016-10-10 LAB — I-STAT CHEM 8, ED
BUN: 40 mg/dL — ABNORMAL HIGH (ref 6–20)
CALCIUM ION: 1.37 mmol/L (ref 1.15–1.40)
CHLORIDE: 108 mmol/L (ref 101–111)
Creatinine, Ser: 1.5 mg/dL — ABNORMAL HIGH (ref 0.44–1.00)
GLUCOSE: 308 mg/dL — AB (ref 65–99)
HCT: 39 % (ref 36.0–46.0)
HEMOGLOBIN: 13.3 g/dL (ref 12.0–15.0)
POTASSIUM: 4.2 mmol/L (ref 3.5–5.1)
Sodium: 145 mmol/L (ref 135–145)
TCO2: 29 mmol/L (ref 0–100)

## 2016-10-10 LAB — PROTIME-INR
INR: 1.04
Prothrombin Time: 13.6 seconds (ref 11.4–15.2)

## 2016-10-10 LAB — COMPREHENSIVE METABOLIC PANEL WITH GFR
ALT: 27 U/L (ref 14–54)
AST: 27 U/L (ref 15–41)
Albumin: 3.8 g/dL (ref 3.5–5.0)
Alkaline Phosphatase: 94 U/L (ref 38–126)
Anion gap: 8 (ref 5–15)
BUN: 31 mg/dL — ABNORMAL HIGH (ref 6–20)
CO2: 27 mmol/L (ref 22–32)
Calcium: 10.1 mg/dL (ref 8.9–10.3)
Chloride: 106 mmol/L (ref 101–111)
Creatinine, Ser: 1.36 mg/dL — ABNORMAL HIGH (ref 0.44–1.00)
GFR calc Af Amer: 39 mL/min — ABNORMAL LOW
GFR calc non Af Amer: 33 mL/min — ABNORMAL LOW
Glucose, Bld: 291 mg/dL — ABNORMAL HIGH (ref 65–99)
Potassium: 3.7 mmol/L (ref 3.5–5.1)
Sodium: 141 mmol/L (ref 135–145)
Total Bilirubin: 0.5 mg/dL (ref 0.3–1.2)
Total Protein: 7.8 g/dL (ref 6.5–8.1)

## 2016-10-10 LAB — RAPID URINE DRUG SCREEN, HOSP PERFORMED
Amphetamines: NOT DETECTED
Barbiturates: NOT DETECTED
Benzodiazepines: NOT DETECTED
Cocaine: NOT DETECTED
Opiates: NOT DETECTED
Tetrahydrocannabinol: NOT DETECTED

## 2016-10-10 LAB — TROPONIN I: TROPONIN I: 0.1 ng/mL — AB (ref <0.03)

## 2016-10-10 LAB — APTT: aPTT: 28 s (ref 24–36)

## 2016-10-10 LAB — I-STAT CG4 LACTIC ACID, ED
LACTIC ACID, VENOUS: 1.51 mmol/L (ref 0.5–1.9)
Lactic Acid, Venous: 1.73 mmol/L (ref 0.5–1.9)

## 2016-10-10 LAB — GLUCOSE, CAPILLARY: GLUCOSE-CAPILLARY: 477 mg/dL — AB (ref 65–99)

## 2016-10-10 LAB — CBG MONITORING, ED
Glucose-Capillary: 259 mg/dL — ABNORMAL HIGH (ref 65–99)
Glucose-Capillary: 398 mg/dL — ABNORMAL HIGH (ref 65–99)

## 2016-10-10 LAB — I-STAT TROPONIN, ED: Troponin i, poc: 0.13 ng/mL (ref 0.00–0.08)

## 2016-10-10 LAB — ETHANOL: Alcohol, Ethyl (B): <5 mg/dL (ref <5)

## 2016-10-10 MED ORDER — SENNOSIDES-DOCUSATE SODIUM 8.6-50 MG PO TABS
1.0000 | ORAL_TABLET | Freq: Every evening | ORAL | Status: DC | PRN
  Filled 2016-10-10: qty 1

## 2016-10-10 MED ORDER — INSULIN ASPART 100 UNIT/ML ~~LOC~~ SOLN
0.0000 [IU] | SUBCUTANEOUS | Status: DC
  Administered 2016-10-11: 2 [IU] via SUBCUTANEOUS
  Administered 2016-10-11: 3 [IU] via SUBCUTANEOUS
  Administered 2016-10-11: 2 [IU] via SUBCUTANEOUS
  Administered 2016-10-11: 5 [IU] via SUBCUTANEOUS
  Administered 2016-10-11: 9 [IU] via SUBCUTANEOUS
  Administered 2016-10-12: 2 [IU] via SUBCUTANEOUS
  Administered 2016-10-12 (×2): 3 [IU] via SUBCUTANEOUS
  Administered 2016-10-12: 2 [IU] via SUBCUTANEOUS
  Administered 2016-10-13: 3 [IU] via SUBCUTANEOUS
  Administered 2016-10-13 – 2016-10-14 (×7): 2 [IU] via SUBCUTANEOUS

## 2016-10-10 MED ORDER — INSULIN ASPART 100 UNIT/ML ~~LOC~~ SOLN: 0.0000 [IU] | Freq: Three times a day (TID) | SUBCUTANEOUS | Status: DC

## 2016-10-10 MED ORDER — DONEPEZIL HCL 10 MG PO TABS
10.0000 mg | ORAL_TABLET | Freq: Every day | ORAL | Status: DC
  Filled 2016-10-10: qty 1

## 2016-10-10 MED ORDER — SODIUM CHLORIDE 0.9 % IV SOLN
INTRAVENOUS | Status: AC
  Administered 2016-10-10: 22:00:00 via INTRAVENOUS

## 2016-10-10 MED ORDER — ASPIRIN 325 MG PO TABS: 325.0000 mg | ORAL_TABLET | Freq: Every day | ORAL | Status: DC

## 2016-10-10 MED ORDER — ATORVASTATIN CALCIUM 20 MG PO TABS
20.0000 mg | ORAL_TABLET | Freq: Every day | ORAL | Status: DC
  Filled 2016-10-10: qty 1

## 2016-10-10 MED ORDER — ACETAMINOPHEN 325 MG PO TABS: 650.0000 mg | ORAL_TABLET | ORAL | Status: DC | PRN

## 2016-10-10 MED ORDER — ENOXAPARIN SODIUM 30 MG/0.3ML ~~LOC~~ SOLN
30.0000 mg | SUBCUTANEOUS | Status: DC
  Administered 2016-10-10 – 2016-10-13 (×4): 30 mg via SUBCUTANEOUS
  Filled 2016-10-10 (×4): qty 0.3

## 2016-10-10 MED ORDER — DEXTROSE 5 % IV SOLN
5.0000 mg/h | INTRAVENOUS | Status: DC
  Administered 2016-10-10: 5 mg/h via INTRAVENOUS
  Administered 2016-10-11: 10 mg/h via INTRAVENOUS
  Administered 2016-10-11: 5 mg/h via INTRAVENOUS
  Filled 2016-10-10 (×4): qty 100

## 2016-10-10 MED ORDER — ACETAMINOPHEN 160 MG/5ML PO SOLN: 650.0000 mg | ORAL | Status: DC | PRN

## 2016-10-10 MED ORDER — INSULIN ASPART 100 UNIT/ML ~~LOC~~ SOLN
12.0000 [IU] | Freq: Once | SUBCUTANEOUS | Status: AC
  Administered 2016-10-10: 12 [IU] via SUBCUTANEOUS

## 2016-10-10 MED ORDER — INSULIN ASPART 100 UNIT/ML ~~LOC~~ SOLN: 0.0000 [IU] | Freq: Every day | SUBCUTANEOUS | Status: DC

## 2016-10-10 MED ORDER — DILTIAZEM LOAD VIA INFUSION
20.0000 mg | Freq: Once | INTRAVENOUS | Status: AC
  Administered 2016-10-10: 20 mg via INTRAVENOUS
  Filled 2016-10-10: qty 20

## 2016-10-10 MED ORDER — MEMANTINE HCL 5 MG PO TABS
5.0000 mg | ORAL_TABLET | Freq: Two times a day (BID) | ORAL | Status: DC
  Filled 2016-10-10: qty 1

## 2016-10-10 MED ORDER — STROKE: EARLY STAGES OF RECOVERY BOOK
Freq: Once | Status: DC
  Filled 2016-10-10: qty 1

## 2016-10-10 MED ORDER — IOPAMIDOL (ISOVUE-370) INJECTION 76%
INTRAVENOUS | Status: AC
  Administered 2016-10-10: 100 mL
  Filled 2016-10-10: qty 100

## 2016-10-10 MED ORDER — ACETAMINOPHEN 650 MG RE SUPP
650.0000 mg | RECTAL | Status: DC | PRN
  Administered 2016-10-14: 650 mg via RECTAL
  Filled 2016-10-10: qty 1

## 2016-10-10 MED ORDER — SODIUM CHLORIDE 0.9 % IV SOLN: INTRAVENOUS | Status: DC

## 2016-10-10 MED ORDER — ASPIRIN 300 MG RE SUPP
300.0000 mg | Freq: Every day | RECTAL | Status: DC
  Administered 2016-10-10 – 2016-10-13 (×4): 300 mg via RECTAL
  Filled 2016-10-10 (×5): qty 1

## 2016-10-10 NOTE — ED Provider Notes (Signed)
Patient lives from our affiliated facility with concern for stroke. On arrival the patient has persistent aphasia, minimal interactivity. Patient was evaluated soon after arrival by our neurologists, [place]. Patient had emergent perfusion study, and her care was discussed with family members by the neurology team. Soon thereafter, the patient had atrial fibrillation, rapid ventricular response, hypertension. We initiated diltiazem therapy. Patient required stepdown given concern for stroke, A. fib, RVR.    Carmin Muskrat, MD 10/10/16 1755

## 2016-10-10 NOTE — ED Notes (Signed)
Unable to keep pulse ox on finger.  Spot check 96 percent on room air.

## 2016-10-10 NOTE — ED Notes (Signed)
Dr. Eulis Foster made aware of manual BP results.

## 2016-10-10 NOTE — ED Notes (Signed)
Pt's niece and POA at bedside.  Denies questions at this time.

## 2016-10-10 NOTE — H&P (Addendum)
History and Physical  [name] [id number] DOB: 12-Jan-1925 DOA: 10/10/2016   PCP: Virgie Dad, MD   Patient coming from: Home  Chief Complaint: aphasia  HPI:  Nancy [name] is a 83 y.o. female with medical history of diabetes mellitus, hypertension,  and coronary artery disease presented from [facility] with onset of aphasia. Apparently, the patient ate breakfast and was conversant at the time on the morning of 10/10/2016. After breakfast, the nursing staff checked back on the patient, and they noted that the patient was aphasic. As result, the patient was transferred to the emergency department for further evaluation. The patient is unable to provide any history secondary to her aphasia and dementia. The patient's niece who is the patient's POA is at bedside to assist with history. Her niece noted yesterday that the patient was less responsive and not "talking as much"in afternoon of 10/09/2016. There's been no reports of fevers, respiratory distress, chest pain, vomiting, or diarrhea. At baseline, the patient is able to interact and speak although she is only able to say a few words. The patient is able to bear weight and ambulate with assistance. However most of time, the patient's niece states that the patient gets around with a wheelchair. At baseline, the patient is able to feed herself but needs assistance with her other ADLs.    In the emergency department, the patient was afebrile and hemodynamically stable and saturating well on room air. Initially, the patient was hypothermic with a temperature of 96.54F. Repeat temperature did show improvement up to 98.82F. BMP and CBC were essentially unremarkable. Hepatic enzymes were negative. Urinalysis was negative for pyuria. Lactic acid was 1.73. CT of the brain showed age indeterminate right temporal parietal infarct and diffuse atrophy.  Assessment/Plan: Aphasia and right hemiparesis -Upon examination, the patient  did not move her right upper extremity or right lower extremity to protopathic stimuli and had right gaze preference -concerned about stroke -I spoke with Dr. Leonel Ramsay who recommended MRI and MRA brain to clarify the patient's clinical situation to determine if the patient needs to be transferred to [facility]--he stated that he will follow up on the results of MRI -if pt does not need transfer>>>consult neurology -PT/OT evaluation -Speech therapy eval -CT brain--age indeterminant right temporal parietal infarct -MRI brain--pending -MRA brain--pending -Carotid Duplex -Echo -LDL -HbA1C -Antiplatelet-- ASA 325  Hypertension -Allow for permissive hypertension -Hydralazine when necessary SBP >220 or DBP >120 -Hold lisinopril  CKD Stage 3 -Baseline creatinine 1.2-1.4  Diabetes mellitus type 2 -NovoLog sliding scale -Hold Amaryl and Januvia -07/04/2016 hemoglobin A1c 7.1  Hyperlipidemia -Continue Zocor -Lipid panel  Dementia -Continue Aricept and Namenda  Elevated Troponin -Likely demand ischemia -Cycle troponins -EKG with unchanged IRBBB          Past Medical History:  Diagnosis Date  . Anemia   . CAD (coronary atherosclerotic disease)   . Cardiac dysrhythmia, unspecified   . Colon cancer ([place]) 1989  . Depressive disorder   . Diabetes mellitus   . Hypertension   . Hypothyroidism   . Organic brain syndrome   . Syncope    Past Surgical History:  Procedure Laterality Date  . COLON SURGERY    . COLONOSCOPY  12/27/2008   Dr. Hilma Favors 6-mm hepatic flexure polyps removed via snare cautery/ONE mm sessile hepatic flexure polyp removed/ONE sessile 6-mm transverse colon polyp removed/ONE sessile 3-mm transverse colon polyp removed/Rare diverticula seen/. The anastomosis is approximately 10 cm above the anal  verge. Tubular adenomas.   . COLONOSCOPY N/A 02/14/2014   Procedure: COLONOSCOPY;  Surgeon: Danie Binder, MD;  [facility];  Service: Endoscopy;   Laterality: N/A;  12:45  . CORONARY ARTERY BYPASS GRAFT    . LACRIMAL TUBE INSERTION  05/14/2011   Procedure: LACRIMAL TUBE INSERTION;  Surgeon: Williams Che;  Location: [facility] ORS;  Service: Ophthalmology;  Laterality: Right;  Placement of Stent Right Lower Canaliculus   Social History:  reports that she has never smoked. She has never used smokeless tobacco. She reports that she does not drink alcohol or use drugs.   Family History  Problem Relation Age of Onset  . Anesthesia problems Neg Hx   . Hypotension Neg Hx   . Malignant hyperthermia Neg Hx   . Pseudochol deficiency Neg Hx      No Known Allergies   Prior to Admission medications   Medication Sig Start Date End Date Taking? Authorizing Provider  aspirin EC 81 MG tablet Take 81 mg by mouth daily.   Yes Historical Provider, MD  donepezil (ARICEPT) 10 MG tablet Take 10 mg by mouth daily. Daily med   Yes Historical Provider, MD  ferrous sulfate 325 (65 FE) MG tablet Take 325 mg by mouth daily with breakfast.    Yes Historical Provider, MD  glimepiride (AMARYL) 1 MG tablet Take 1 tablet (1 mg total) by mouth daily with breakfast. 01/09/16  Yes Hendricks Limes, MD  lisinopril (PRINIVIL,ZESTRIL) 10 MG tablet Take 10 mg by mouth daily. Hold for systolic 99991111   Yes Historical Provider, MD  magnesium hydroxide (MILK OF MAGNESIA) 400 MG/5ML suspension Take 30 mLs by mouth daily as needed for mild constipation.   Yes Historical Provider, MD  memantine (NAMENDA) 5 MG tablet Take 5 mg by mouth 2 (two) times daily.   Yes Historical Provider, MD  simvastatin (ZOCOR) 40 MG tablet Take 40 mg by mouth at bedtime. Hold for HLD   Yes Historical Provider, MD  sitaGLIPtin (JANUVIA) 25 MG tablet Take 25 mg by mouth daily.   Yes Historical Provider, MD  [place] 1 application topically daily as needed. Apply every shift as need on top of buttocks.   Yes Historical Provider, MD    Review of Systems:  Unobtainable secondary to patient's  acute medical condition and underlying dementia  Physical Exam: Vitals:   10/10/16 1230 10/10/16 1300 10/10/16 1309 10/10/16 1330  BP: 199/94 (!) 226/94 (!) 242/100 197/99  Pulse: 81  (!) 122   Resp: (!) 29 22 14 19   Temp:      TempSrc:      SpO2: 100%  98%   Weight:      Height:       General:  Alert but noncommunicative, NAD, nontoxic, pleasant/cooperative Head/Eye: No conjunctival hemorrhage, no icterus, [place]/AT, No nystagmus ENT:  No icterus,  No thrush, good dentition, no pharyngeal exudate Neck:  No masses, no lymphadenpathy, no bruits CV:  RRR, no rub, no gallop, no S3 Lung:  Diminished breath sounds at the bases without any wheezing. Abdomen: soft/NT, +BS, nondistended, no peritoneal signs Ext: No cyanosis, No rashes, No petechiae, No lymphangitis, No edema Neuro: R-gaze preference, strength 2/5 LUE, LLE and 0/5 RUE, RLE,   Labs on Admission:  Basic Metabolic Panel:  Recent Labs Lab 10/10/16 1107 10/10/16 1131  NA 145 141  K 4.2 3.7  CL 108 106  CO2  --  27  GLUCOSE 308* 291*  BUN 40* 31*  CREATININE 1.50* 1.36*  CALCIUM  --  10.1   Liver Function Tests:  Recent Labs Lab 10/10/16 1131  AST 27  ALT 27  ALKPHOS 94  BILITOT 0.5  PROT 7.8  ALBUMIN 3.8   No results for input(s): LIPASE, AMYLASE in the last 168 hours.  Recent Labs Lab 10/10/16 1206  AMMONIA 22   CBC:  Recent Labs Lab 10/10/16 1107 10/10/16 1131  WBC  --  6.6  NEUTROABS  --  3.0  HGB 13.3 12.5  HCT 39.0 38.9  MCV  --  88.6  PLT  --  144*   Coagulation Profile:  Recent Labs Lab 10/10/16 1229  INR 1.04   Cardiac Enzymes: No results for input(s): CKTOTAL, CKMB, CKMBINDEX, TROPONINI in the last 168 hours. BNP: Invalid input(s): POCBNP CBG:  Recent Labs Lab 10/10/16 1040  GLUCAP 259*   Urine analysis:    Component Value Date/Time   COLORURINE STRAW (A) 10/10/2016 1055   APPEARANCEUR CLEAR 10/10/2016 1055   LABSPEC 1.014 10/10/2016 1055   PHURINE 7.0 10/10/2016  1055   GLUCOSEU >=500 (A) 10/10/2016 1055   HGBUR NEGATIVE 10/10/2016 1055   BILIRUBINUR NEGATIVE 10/10/2016 1055   KETONESUR NEGATIVE 10/10/2016 1055   PROTEINUR 100 (A) 10/10/2016 1055   UROBILINOGEN 0.2 08/07/2008 0025   NITRITE NEGATIVE 10/10/2016 1055   LEUKOCYTESUR NEGATIVE 10/10/2016 1055   Sepsis Labs: @LABRCNTIP (procalcitonin:4,lacticidven:4) ) Recent Results (from the past 240 hour(s))  Blood culture (routine x 2)     Status: None (Preliminary result)   Collection Time: 10/10/16 11:35 AM  Result Value Ref Range Status   Specimen Description BLOOD RIGHT HAND  Final   Special Requests BOTTLES DRAWN AEROBIC ONLY 6cc  Final   Culture PENDING  Incomplete   Report Status PENDING  Incomplete  Blood culture (routine x 2)     Status: None (Preliminary result)   Collection Time: 10/10/16 12:06 PM  Result Value Ref Range Status   Specimen Description BLOOD LEFT ARM  Final   Special Requests BOTTLES DRAWN AEROBIC AND ANAEROBIC 6CC  Final   Culture PENDING  Incomplete   Report Status PENDING  Incomplete     Radiological Exams on Admission: Ct Head Wo Contrast  Result Date: 10/10/2016 CLINICAL DATA:  Acute onset aphasia and lower extremity weakness this morning. EXAM: CT HEAD WITHOUT CONTRAST TECHNIQUE: Contiguous axial images were obtained from the base of the skull through the vertex without intravenous contrast. COMPARISON:  08/07/2008 FINDINGS: Brain: No evidence of hemorrhage, hydrocephalus, extra-axial collection or mass lesion/mass effect. Stable mild cerebral and cerebellar atrophy. Stable appearance of old bilateral cerebellar and high right parietal infarcts. Moderate chronic small vessel disease again noted. Focal decreased attenuation is seen in the right temporoparietal region which was not seen on previous study. This is fairly well defined, and suspicious for a subacute or chronic infarct. No definite acute cerebral infarct identified. Vascular: No hyperdense vessel or  unexpected calcification. Skull: Normal. Negative for fracture or focal lesion. Sinuses/Orbits: Chronic right mastoid effusion again noted. Other: None. IMPRESSION: Age-indeterminate right temporoparietal infarct, new since 2009 exam, likely subacute or chronic in age. Consider brain MRI without and with contrast for further evaluation. Diffuse cerebral and cerebellar atrophy, chronic small vessel disease, and old bilateral cerebellar and high right parietal infarcts. Electronically Signed   By: Earle Gell M.D.   On: 10/10/2016 12:09   Dg Chest Port 1 View  Result Date: 10/10/2016 CLINICAL DATA:  Altered mental status EXAM: PORTABLE CHEST 1 VIEW COMPARISON:  11/14/2015 FINDINGS: Previous median sternotomy and CABG procedure. Mild cardiac enlargement. Aortic atherosclerosis. There is a small left pleural effusion. No interstitial edema or airspace opacities. IMPRESSION: 1. Small left pleural effusion. Electronically Signed   By: Kerby Moors M.D.   On: 10/10/2016 11:12    EKG: Independently reviewed. Sinus rhythm, IRBBB, LAFB    Time spent:60 minutes Code Status:   DNR Family Communication:  Niece updated at bedside 10/10/16 Disposition Plan: expect 2-3 day hospitalization Consults called: Neurology--Dr. Leonel Ramsay DVT Prophylaxis: [place] Lovenox  Cylah Fannin, DO  [facility] Pager [phone]  If 7PM-7AM, please contact night-coverage [email or web address] Password [email or web address] 10/10/2016, 2:56 PM

## 2016-10-10 NOTE — ED Notes (Signed)
Pt arrived to the ED and placed in Trauma B by [facility]

## 2016-10-10 NOTE — Code Documentation (Addendum)
83 y.o. Female arrives to [facility] ED via [facility] from [facility] as code stroke. Per [facility] ED pt ate breakfast as per usual, alert and verbal at baseline. [id number]. Upon NT entering room to provide bath pt aphasic and weak in ble. Patient transferred to [facility] for further management. CTH at [facility] revealed age-indeterminate right temporoparietal infarct, new since 2009 exam, likely subacute or chronic in age. Diffuse cerebral and cerebellar atrophy, chronic small vessel disease, and old bilateral cerebellar and high right parietal infarcts. MR from [facility] showed patchy 2 cm area of right MCA territory ischemia in the posterior right temporal lobe with no hemorrhage or mass effect. Possible superimposed acute cortical lacune in the right parietal lobe. No other acute ischemia identified, specifically no convincing left side diffusion abnormality. Underlying advanced chronic ischemic disease otherwise appears not significantly changed since 2007. MRA from [facility] was significantly degraded by motion artifact. No strong evidence of emergent large vessel occlusion. Upon arrival to [facility] ED, patient had an 18g PIV placed and then taken to CT. CTP and CTA results pending. Pt taken back to ED. Patient currently is nonverbal, unable to follow commands, moving all extrimities but has no effort against gravity and globally aphasic. NIHSS 22. See EMR for code stroke times and NIHSS. Patient in a fib with RVR and hypertensive. Cardizem gtt started. Neurologist in contact with POA to discuss case. tPA not given d/t being out of the window. Bedside handoff with ED RN Jarrett Soho.

## 2016-10-10 NOTE — Progress Notes (Signed)
Spoke with EDP--Dr. Vanita Panda regarding full admission to [facility]. CT angio brain and CT perfusion study done at [facility]--final results pending -pt not a candidate for aggressive intra-arterial intervention -since arrival to [facility] ED--pt develped Afib with RVR--started on cardizem drip -MRI brain--Patchy 2 cm area of right MCA territory ischemia in the posterior right temporal lobe with no hemorrhage or mass effect. Possible superimposed acute cortical lacune in the right parietal lobe. -MRA brain--no large vessel occlusion -allow permissive HTN, if possible, while controlling HR--last BP 234/107 -pt now needing stepdown bed for diltiazem titration -Echo already ordered, add TSH -all admission ordered previously done while at [facility]

## 2016-10-10 NOTE — ED Notes (Signed)
Notified by Dr. Carles Collet pt is going to [facility] ED.

## 2016-10-10 NOTE — ED Triage Notes (Signed)
Per [facility] staff- pt ate breakfast as per usual, alert and verbal at baseline. [id number]. Upon NT entering room to provide bath pt aphasic and weak in ble.

## 2016-10-10 NOTE — ED Notes (Signed)
Pt's HR is variable into the 130s.  Remains sinus tach and decreases when touched or talked to.  Niece at bedside.

## 2016-10-10 NOTE — Progress Notes (Signed)
Location:   [place] Room Number: 109/D Place of Service:  SNF (31) Provider:  Clydene Fake, MD  Patient Care Team: Virgie Dad, MD as PCP - General (Internal Medicine) Danie Binder, MD as Consulting Physician (Gastroenterology)  Extended Emergency Contact Information Primary Emergency Contact: [facility] Address: [address]          [place], [place] [address] [place] Phone: [phone] Work Phone: [phone] Relation: Other  Code Status:  Full Code Goals of care: Advanced Directive information Advanced Directives 10/10/2016  Does Patient Have a Medical Advance Directive? Yes  Type of Advance Directive (No Data)  Does patient want to make changes to medical advance directive? No - Patient declined  Copy of [place] in Chart? -  Pre-existing out of facility DNR order (yellow form or pink MOST form) -     Chief Complaint  Patient presents with  . Acute Visit    Acute Mental status Change    HPI:  Pt is a 83 y.o. female seen today for an acute visit for Acute Mental status Change. Patient has h/o Hypertension, Diabetes, Chronic Renal disease, CAD s/p CABG  She was doing well this morning and according to the staff ate good breakfast and was at her baseline. But when that aide went to see the patient later she was not her usual self and was not responding. I was called to evaluate the patient. She at her baseline follows some commands and usually more awake and responsive but she was in her bed not responding . Still will squeeze your Hand but no other response.   Past Medical History:  Diagnosis Date  . Anemia   . CAD (coronary atherosclerotic disease)   . Cardiac dysrhythmia, unspecified   . Colon cancer ([place]) 1989  . Depressive disorder   . Diabetes mellitus   . Hypertension   . Hypothyroidism   . Organic brain syndrome   . Syncope    Past Surgical History:    Procedure Laterality Date  . COLON SURGERY    . COLONOSCOPY  12/27/2008   Dr. Hilma Favors 6-mm hepatic flexure polyps removed via snare cautery/ONE mm sessile hepatic flexure polyp removed/ONE sessile 6-mm transverse colon polyp removed/ONE sessile 3-mm transverse colon polyp removed/Rare diverticula seen/. The anastomosis is approximately 10 cm above the anal verge. Tubular adenomas.   . COLONOSCOPY N/A 02/14/2014   Procedure: COLONOSCOPY;  Surgeon: Danie Binder, MD;  [facility];  Service: Endoscopy;  Laterality: N/A;  12:45  . CORONARY ARTERY BYPASS GRAFT    . LACRIMAL TUBE INSERTION  05/14/2011   Procedure: LACRIMAL TUBE INSERTION;  Surgeon: Williams Che;  Location: [facility] ORS;  Service: Ophthalmology;  Laterality: Right;  Placement of Stent Right Lower Canaliculus    No Known Allergies  Current Outpatient Prescriptions on File Prior to Visit  Medication Sig Dispense Refill  . aspirin EC 81 MG tablet Take 81 mg by mouth daily.    Marland Kitchen donepezil (ARICEPT) 10 MG tablet Take 10 mg by mouth daily. Daily med    . ferrous sulfate 325 (65 FE) MG tablet Take 325 mg by mouth daily with breakfast.     . glimepiride (AMARYL) 1 MG tablet Take 1 tablet (1 mg total) by mouth daily with breakfast.    . lisinopril (PRINIVIL,ZESTRIL) 10 MG tablet Take 10 mg by mouth daily. Hold for systolic 99991111    . memantine (NAMENDA) 5  MG tablet Take 5 mg by mouth 2 (two) times daily.    . simvastatin (ZOCOR) 40 MG tablet Take 40 mg by mouth at bedtime. Hold for HLD    . sitaGLIPtin (JANUVIA) 25 MG tablet Take 25 mg by mouth daily.    Marland Kitchen ZINC OXIDE EX Apply topically. Apply  every shift prn on top of buttocks     No current facility-administered medications on file prior to visit.      Review of Systems  Unable to perform ROS: Patient unresponsive    Immunization History  Administered Date(s) Administered  . Influenza-Unspecified 06/16/2014  . Pneumococcal Polysaccharide-23 06/19/2007  .  Pneumococcal-Unspecified 06/21/2016   Pertinent  Health Maintenance Due  Topic Date Due  . FOOT EXAM  02/05/2017 (Originally 06/03/1935)  . OPHTHALMOLOGY EXAM  02/05/2017 (Originally 06/03/1935)  . DEXA SCAN  02/05/2017 (Originally 06/03/1992)  . URINE MICROALBUMIN  06/04/2017 (Originally 06/03/1935)  . INFLUENZA VACCINE  08/09/2017 (Originally 04/09/2016)  . HEMOGLOBIN A1C  01/02/2017  . PNA vac Low Risk Adult (2 of 2 - PCV13) 06/21/2017   No flowsheet data found. Functional Status Survey:    Vitals:   10/10/16 0959  BP: (!) 170/100Repeat 160/102  Pulse: (!) 105  Resp: (!) 22  Temp: 98.6 F (37 C)  TempSrc: Oral  SpO2: 97% Blood sugar with Accu [id number]   There is no height or weight on file to calculate BMI. Physical Exam  Constitutional: She appears well-developed and well-nourished.  HENT:  Head: Normocephalic.  Eyes: Pupils are equal, round, and reactive to light.  Cardiovascular: An irregular rhythm present. Tachycardia present.   No murmur heard. Pulmonary/Chest: Effort normal and breath sounds normal. No respiratory distress. She has no wheezes.  Abdominal: Soft. Bowel sounds are normal. She exhibits no distension. There is no tenderness. There is no rebound.  Musculoskeletal: She exhibits edema.  Lymphadenopathy:    She has no cervical adenopathy.  Neurological: She is alert.  But not responding to basic commands.  Skin: Skin is warm and dry.    Labs reviewed:  Recent Labs  07/04/16 0700 07/30/16 0500 08/28/16 0700  NA 141 140 142  K 4.1 3.9 3.6  CL 109 106 112*  CO2 27 28 26   GLUCOSE 149* 98 92  BUN 40* 28* 32*  CREATININE 1.53* 1.25* 1.30*  CALCIUM 9.9 10.2 9.8    Recent Labs  11/15/15 0710 12/15/15 0900 03/29/16 0740 07/04/16 0700  AST 25 18 17 19   ALT 25 13* 14 15  ALKPHOS 93 95  --  79  BILITOT 0.4 0.4  --  0.4  PROT 7.3 7.1  --  7.4  ALBUMIN 2.9* 3.4*  --  3.7    Recent Labs  03/29/16 0740 07/04/16 0700 08/28/16 0700  WBC 5.0  4.7 4.9  NEUTROABS 1.8 2.1 2.0  HGB 10.6* 11.3* 10.4*  HCT 33.2* 35.7* 33.0*  MCV 87.1 89.5 89.2  PLT 135* 153 152   Lab Results  Component Value Date   TSH 1.588 05/01/2016   Lab Results  Component Value Date   HGBA1C 7.1 (H) 07/04/2016   Lab Results  Component Value Date   CHOL 124 05/01/2016   HDL 49 05/01/2016   LDLCALC 61 05/01/2016   TRIG 68 05/01/2016   CHOLHDL 2.5 05/01/2016    Significant Diagnostic Results in last 30 days:  No results found.  Assessment/Plan Altered Mental status Patient does have acute mental status changes. Possible stroke or infection. Unable to do any more evaluation  in the facility and send her to the ED for further eval.   Family/ staff Communication: Family will be informed of the change.  Labs/tests ordered:

## 2016-10-10 NOTE — ED Notes (Signed)
Waiting for Cardizem from Pharmacy. Pharmacy made aware of the need

## 2016-10-10 NOTE — ED Provider Notes (Signed)
[facility] DEPT Provider Note   CSN: [id number] Arrival date & time: 10/10/16  1038   By signing my name below, I, [name], attest that this documentation has been prepared under the direction and in the presence of Daleen Bo, MD. Electronically Signed: [name], Scribe. 10/10/16. 11:20 AM. History   Chief Complaint Chief Complaint  Patient presents with  . Aphasia     LEVEL 5 CAVEAT: Aphasia The history is provided by the nursing home. No language interpreter was used.   HPI Comments: Nancy [name] is a 83 y.o. female who presents to the Emergency Department with aphasia that occurred prior to arrival. Per [facility]: Patient ate breakfast this morning with no complications. She was verbal and alert as usual. They were about to give the patient a bath when they noticed the patient was aphasic and weak in her bilateral lower extremities. She was immediately taken to the ED.   Past Medical History:  Diagnosis Date  . Anemia   . CAD (coronary atherosclerotic disease)   . Cardiac dysrhythmia, unspecified   . Colon cancer ([place]) 1989  . Depressive disorder   . Diabetes mellitus   . Hypertension   . Hypothyroidism   . Organic brain syndrome   . Syncope     Patient Active Problem List   Diagnosis Date Noted  . Anemia 06/04/2016  . Hearing loss 02/19/2016  . Type 2 diabetes with nephropathy ([place]) 11/26/2015  . Cough 11/19/2015  . Conjunctivitis 08/07/2014  . HTN (hypertension) 04/02/2011  . Hyperlipemia 04/02/2011  . Periorbital cellulitis 04/01/2011  . CLL (chronic lymphocytic leukemia) ([place]) 12/06/2008  . COLONIC POLYPS, RECURRENT 12/06/2008  . DEMENTIA, WITH DEPRESSION 12/06/2008  . CAD (coronary artery disease), native coronary artery 12/06/2008  . Cerebral artery occlusion with cerebral infarction ([place]) 12/06/2008  . DYSPHAGIA DUE TO CEREBROVASCULAR DISEASE 12/06/2008  . GERD 12/06/2008  . Chronic kidney disease 12/06/2008  . History of  malignant neoplasm of large intestine 12/06/2008  . PERSONAL HX OF METHICILLIN RESIST STAPH AUREUS 12/06/2008  . HELICOBACTER PYLORI GASTRITIS, HX OF 12/06/2008    Past Surgical History:  Procedure Laterality Date  . COLON SURGERY    . COLONOSCOPY  12/27/2008   Dr. Hilma Favors 6-mm hepatic flexure polyps removed via snare cautery/ONE mm sessile hepatic flexure polyp removed/ONE sessile 6-mm transverse colon polyp removed/ONE sessile 3-mm transverse colon polyp removed/Rare diverticula seen/. The anastomosis is approximately 10 cm above the anal verge. Tubular adenomas.   . COLONOSCOPY N/A 02/14/2014   Procedure: COLONOSCOPY;  Surgeon: Danie Binder, MD;  Location[facility];  Service: Endoscopy;  Laterality: N/A;  12:45  . CORONARY ARTERY BYPASS GRAFT    . LACRIMAL TUBE INSERTION  05/14/2011   Procedure: LACRIMAL TUBE INSERTION;  Surgeon: Williams Che;  Location: [facility] ORS;  Service: Ophthalmology;  Laterality: Right;  Placement of Stent Right Lower Canaliculus    OB History    No data available       Home Medications    Prior to Admission medications   Medication Sig Start Date End Date Taking? Authorizing Provider  aspirin EC 81 MG tablet Take 81 mg by mouth daily.   Yes Historical Provider, MD  donepezil (ARICEPT) 10 MG tablet Take 10 mg by mouth daily. Daily med   Yes Historical Provider, MD  ferrous sulfate 325 (65 FE) MG tablet Take 325 mg by mouth daily with breakfast.    Yes Historical Provider, MD  glimepiride (AMARYL) 1 MG tablet Take 1  tablet (1 mg total) by mouth daily with breakfast. 01/09/16  Yes Hendricks Limes, MD  lisinopril (PRINIVIL,ZESTRIL) 10 MG tablet Take 10 mg by mouth daily. Hold for systolic 99991111   Yes Historical Provider, MD  magnesium hydroxide (MILK OF MAGNESIA) 400 MG/5ML suspension Take 30 mLs by mouth daily as needed for mild constipation.   Yes Historical Provider, MD  memantine (NAMENDA) 5 MG tablet Take 5 mg by mouth 2 (two) times daily.   Yes  Historical Provider, MD  simvastatin (ZOCOR) 40 MG tablet Take 40 mg by mouth at bedtime. Hold for HLD   Yes Historical Provider, MD  sitaGLIPtin (JANUVIA) 25 MG tablet Take 25 mg by mouth daily.   Yes Historical Provider, MD  [place] 1 application topically daily as needed. Apply every shift as need on top of buttocks.   Yes Historical Provider, MD    Family History Family History  Problem Relation Age of Onset  . Anesthesia problems Neg Hx   . Hypotension Neg Hx   . Malignant hyperthermia Neg Hx   . Pseudochol deficiency Neg Hx     Social History Social History  Substance Use Topics  . Smoking status: Never Smoker  . Smokeless tobacco: Never Used  . Alcohol use No     Allergies   Patient has no known allergies.   Review of Systems Review of Systems  Unable to perform ROS: Patient nonverbal     Physical Exam Updated Vital Signs BP 197/99   Pulse (!) 122   Temp (!) 96.4 F (35.8 C) (Rectal)   Resp 19   Ht 5\' 5"  (1.651 m)   Wt 140 lb (63.5 kg)   SpO2 98%   BMI 23.30 kg/m   Physical Exam  Constitutional: She appears well-developed.  Elderly, frail  HENT:  Head: Normocephalic and atraumatic.  Eyes: Conjunctivae and EOM are normal.  Clear drainage from left eye. Pinpoint pupils bilaterally.  Neck: Normal range of motion and phonation normal. Neck supple.  Cardiovascular:  Irregular tachycardia  Pulmonary/Chest: Effort normal and breath sounds normal. She exhibits no tenderness.  Abdominal: Soft. She exhibits no distension. There is no tenderness. There is no guarding.  Musculoskeletal: She exhibits no deformity.  Neurological: She is alert.  She does not respond to commands to move. She is responsive to painful stimuli, and removes examiner's hand with left hand. She does not spontaneously move the right arm and the right arm has decreased tone relative to the left. Normal legs bilaterally. No clonus.  Skin: Skin is warm and dry.  Psychiatric:    Lethargic  Nursing note and vitals reviewed.   ED Treatments / Results  DIAGNOSTIC STUDIES: Oxygen Saturation is 98% on RA, normal by my interpretation.    Labs (all labs ordered are listed, but only abnormal results are displayed) Labs Reviewed  CBC - Abnormal; Notable for the following:       Result Value   Platelets 144 (*)    All other components within normal limits  COMPREHENSIVE METABOLIC PANEL - Abnormal; Notable for the following:    Glucose, Bld 291 (*)    BUN 31 (*)    Creatinine, Ser 1.36 (*)    GFR calc non Af Amer 33 (*)    GFR calc Af Amer 39 (*)    All other components within normal limits  URINALYSIS, ROUTINE W REFLEX MICROSCOPIC - Abnormal; Notable for the following:    Color, Urine STRAW (*)    Glucose,  UA >=500 (*)    Protein, ur 100 (*)    All other components within normal limits  CBG MONITORING, ED - Abnormal; Notable for the following:    Glucose-Capillary 259 (*)    All other components within normal limits  I-STAT CHEM 8, ED - Abnormal; Notable for the following:    BUN 40 (*)    Creatinine, Ser 1.50 (*)    Glucose, Bld 308 (*)    All other components within normal limits  I-STAT TROPOININ, ED - Abnormal; Notable for the following:    Troponin i, poc 0.13 (*)    All other components within normal limits  CULTURE, BLOOD (ROUTINE X 2)  CULTURE, BLOOD (ROUTINE X 2)  ETHANOL  DIFFERENTIAL  RAPID URINE DRUG SCREEN, HOSP PERFORMED  AMMONIA  PROTIME-INR  APTT  I-STAT CG4 LACTIC ACID, ED  I-STAT CG4 LACTIC ACID, ED    EKG  EKG Interpretation  Date/Time:  Thursday October 10 2016 13:32:48 EST Ventricular Rate:  97 PR Interval:    QRS Duration: 108 QT Interval:  396 QTC Calculation: 504 R Axis:   -28 Text Interpretation:  Sinus rhythm Incomplete RBBB and LAFB Anteroseptal infarct, old Prolonged QT interval Since last tracing QT has lengthened Confirmed by Eulis Foster  MD, Cresencia Asmus [phone]) on 10/10/2016 1:43:59 PM       Radiology Ct Head Wo  Contrast  Result Date: 10/10/2016 CLINICAL DATA:  Acute onset aphasia and lower extremity weakness this morning. EXAM: CT HEAD WITHOUT CONTRAST TECHNIQUE: Contiguous axial images were obtained from the base of the skull through the vertex without intravenous contrast. COMPARISON:  08/07/2008 FINDINGS: Brain: No evidence of hemorrhage, hydrocephalus, extra-axial collection or mass lesion/mass effect. Stable mild cerebral and cerebellar atrophy. Stable appearance of old bilateral cerebellar and high right parietal infarcts. Moderate chronic small vessel disease again noted. Focal decreased attenuation is seen in the right temporoparietal region which was not seen on previous study. This is fairly well defined, and suspicious for a subacute or chronic infarct. No definite acute cerebral infarct identified. Vascular: No hyperdense vessel or unexpected calcification. Skull: Normal. Negative for fracture or focal lesion. Sinuses/Orbits: Chronic right mastoid effusion again noted. Other: None. IMPRESSION: Age-indeterminate right temporoparietal infarct, new since 2009 exam, likely subacute or chronic in age. Consider brain MRI without and with contrast for further evaluation. Diffuse cerebral and cerebellar atrophy, chronic small vessel disease, and old bilateral cerebellar and high right parietal infarcts. Electronically Signed   By: Earle Gell M.D.   On: 10/10/2016 12:09   Dg Chest Port 1 View  Result Date: 10/10/2016 CLINICAL DATA:  Altered mental status EXAM: PORTABLE CHEST 1 VIEW COMPARISON:  11/14/2015 FINDINGS: Previous median sternotomy and CABG procedure. Mild cardiac enlargement. Aortic atherosclerosis. There is a small left pleural effusion. No interstitial edema or airspace opacities. IMPRESSION: 1. Small left pleural effusion. Electronically Signed   By: Kerby Moors M.D.   On: 10/10/2016 11:12    Procedures Procedures (including critical care time)  Medications Ordered in ED Medications - No  data to display   Initial Impression / Assessment and Plan / ED Course  I have reviewed the triage vital signs and the nursing notes.  Pertinent labs & imaging results that were available during my care of the patient were reviewed by me and considered in my medical decision making (see chart for details).  Clinical Course as of Oct 10 1357  Thu Oct 10, 2016  1053 At this time the patient is not a candidate  for Thrombolysis  [EW]  38 The patient's niece is here now. She is the patient's power of attorney. She states that the patient occasionally does not talk when spoken to. The niece feels that the patient has been somewhat more sleepy and less responsive than usual, since yesterday. Sometimes the patient is able to mobilize well in a wheelchair to another patient's room at her facility. The patient does not walk unless assisted and the niece has never seen her walk. Additionally, the niece thinks that the patient's right arm is weaker today than usual. The patient has been in skilled nursing, for at least 15 years.  [EW]    Clinical Course User Index [EW] Daleen Bo, MD    Medications - No data to display  Patient Vitals for the past 24 hrs:  BP Temp Temp src Pulse Resp SpO2 Height Weight  10/10/16 1330 197/99 - - - 19 - - -  10/10/16 1309 (!) 242/100 - - (!) 122 14 98 % - -  10/10/16 1300 (!) 226/94 - - - 22 - - -  10/10/16 1230 199/94 - - 81 (!) 29 100 % - -  10/10/16 1100 186/81 - - - 10 - - -  10/10/16 1041 - - - - - - 5\' 5"  (1.651 m) 140 lb (63.5 kg)  10/10/16 1040 (!) 207/99 (!) 96.4 F (35.8 C) Rectal (!) 123 20 98 % - -    1:56 PM Reevaluation with update and discussion. After initial assessment and treatment, an updated evaluation reveals Clinical status is unchanged from earlier evaluation. Niece updated on findings and plan.Richarda Blade   2:22 PM-Consult complete with hospitalist. Patient case explained and discussed. He agrees to admit patient for further  evaluation and treatment. Call ended at 14:30   Final Clinical Impressions(s) / ED Diagnoses   Final diagnoses:  Cerebrovascular accident (CVA), unspecified mechanism ([place])  Secondary hypertension  Elevated troponin    The patient with new onset decreased ability to converse, and apparently new right arm weakness. CT is consistent with a right brain stroke, time indeterminate. Patient with mild elevation of blood pressure and troponin, these will need to be trended and possibly treated. MRI ordered to evaluate for cause of right arm weakness, including stroke.  The patient was not a candidate for thrombolysis upon arrival.   Nursing Notes Reviewed/ Care Coordinated, and agree without changes. Applicable Imaging Reviewed.  Interpretation of Laboratory Data incorporated into ED treatment   Plan: Admit    New Prescriptions New Prescriptions   No medications on file   I personally performed the services described in this documentation, which was scribed in my presence. The recorded information has been reviewed and is accurate.     Daleen Bo, MD 10/10/16 [phone]

## 2016-10-10 NOTE — ED Notes (Signed)
Pt arrived from [facility] after patient was noted to have a change in speech and responsiveness. Pt needed to have CT Perfusion and was transferred to be accepted by MD Leonel Ramsay.

## 2016-10-10 NOTE — Progress Notes (Addendum)
The case was discussed with Dr. Kathrynn Speed of Neurology after MRI/MRA brain Since the patient's family wants everything done, he recommended transfer to [facility] via an ED to ED transfer for CT angio of brain and/or CT perfusion study. Spoke with ED RN and [facility]  After nearly 90 minutes, POA--Cynthia Maricela Bo, returns to ED--updated -Her cell phone number [phone] DTat

## 2016-10-10 NOTE — ED Notes (Signed)
Pt's elopement bracelet placed in transfer paperwork envelope.  Report given to [place] at [facility].

## 2016-10-10 NOTE — Consult Note (Signed)
Neurology Consultation Reason for Consult: Aphasia Referring Physician: Tat, D  CC: Right-sided weakness and aphasia  History is obtained from: Niece  HPI: Nancy [name] is a 83 y.o. female who was last her normal state of health sometime yesterday, when she started having decreased verbal output. Today, there is concern for bilateral lower extremity weakness and aphasia and therefore the patient was taken [facility]. She is not IV TPA candidate given that she had last been seen normal the day before but due to the severity of her symptoms, the hospitalist called me to ask about possible further options. She was already in the MRI suite when I was called and therefore an MRA was performed. The MRI did demonstrate a moderate size right parietal infarct. Though no definite conclusion was present, I did feel that there is a possibility of a nonocclusive thrombus in the M1. Also the study was very poor quality. She was therefore transferred to [facility].  She was transferred down for CT perfusion/CT angiogram which was performed and did demonstrate ACA occlusion with some penumbra.  I had a long discussion with the niece who is the healthcare power of attorney. At baseline, the patient is predominantly wheelchair bound, able to walk only a little bit predominantly with assistance. He spends most days in bed, but occasionally does get up and have "good days." She does talk and interact, though this is been decreasing over time.  Given her baseline, and the fact that she has a definite parietal infarct which is likely going to worsen her baseline to some degree, I discussed with the niece about whether she would want to be aggressive in a situation like this and after discussion decided that we would not take her for any aggressive procedures. I also discussed CODE STATUS, and in the event of cardiac arrest, she would not want resuscitation, but would want full support up to the point of that.   LKW:  Yesterday, unclear time tpa given?: no, out of window Premorbid modified rankin scale: 4     ROS: A 14 point ROS was performed and is negative except as noted in the HPI.  Unable to obtain due to altered mental status.   Past Medical History:  Diagnosis Date  . Anemia   . CAD (coronary atherosclerotic disease)   . Cardiac dysrhythmia, unspecified   . Colon cancer ([place]) 1989  . Depressive disorder   . Diabetes mellitus   . Hypertension   . Hypothyroidism   . Organic brain syndrome   . Syncope      Family History  Problem Relation Age of Onset  . Anesthesia problems Neg Hx   . Hypotension Neg Hx   . Malignant hyperthermia Neg Hx   . Pseudochol deficiency Neg Hx      Social History:  reports that she has never smoked. She has never used smokeless tobacco. She reports that she does not drink alcohol or use drugs.   Exam: Current vital signs: BP (!) 211/114   Pulse (!) 139   Temp 98 F (36.7 C) (Axillary)   Resp (!) 27   Ht 5\' 5"  (1.651 m)   Wt 63.5 kg (140 lb)   SpO2 98%   BMI 23.30 kg/m  Vital signs in last 24 hours: Temp:  [96.4 F (35.8 C)-98.6 F (37 C)] 98 F (36.7 C) (02/01 1723) Pulse Rate:  [81-155] 139 (02/01 1823) Resp:  [10-34] 27 (02/01 1823) BP: (170-242)/(81-117) 211/114 (02/01 1823) SpO2:  [97 %-100 %]  98 % (02/01 1823) Weight:  [63.5 kg (140 lb)] 63.5 kg (140 lb) (02/01 1041)   Physical Exam  Constitutional: Appears Elderly Psych: She does not interact much Eyes: No scleral injection HENT: She constantly protrudes her tongue. Head: Normocephalic.  Cardiovascular: Normal rate and regular rhythm.  Respiratory: Effort normal and breath sounds normal to anterior ascultation GI: Soft.  No distension. There is no tenderness.  Skin: WDI  Neuro: Mental Status: Patient is awake, she does not clearly follow commands and does not speak. She does fixate and track. Cranial Nerves: II: Visual Fields are difficult to assess as she does not blink  to threat from either direction. But she does fixate and track Pupils are equal, round, and reactive to light.   III,IV, VI: She does cross midline in both directions V: Facial sensation is symmetric to temperature VII: She keeps her face kind of squished, but no definite laterality. XII: tongue is midline without atrophy or fasciculations.  Motor: She withdraws minimally to noxious stimuli in bilateral arms, and left leg, no movement in the right leg. 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 My suspicion is that this will be a fairly disabling stroke. Given her poor functional status prior to this as well as the fact that it likely would've been at least somewhat worse with the other acute infarction which had completed, I agree with the niece's decision to hold off on heroic measures such as interventional procedures.  I suspect the strokes are due to her new onset atrial fibrillation  Recommendations: 1) lipid panel, A1c 2) Echo 3) no further vascular imaging is necessary 4) ASA 325 daily 5) treatment of rate control for atrial for relation per internal medicine 6) she will need therapy, though if she does not improve may need to consider discussions about comfort care.   Roland Rack, MD [facility] [phone]  If 7pm- 7am, please page neurology on call as listed in [place].

## 2016-10-11 ENCOUNTER — Telehealth: Payer: Self-pay

## 2016-10-11 ENCOUNTER — Inpatient Hospital Stay (HOSPITAL_COMMUNITY): Payer: Medicare Other

## 2016-10-11 DIAGNOSIS — I6789 Other cerebrovascular disease: Secondary | ICD-10-CM

## 2016-10-11 LAB — LIPID PANEL
CHOL/HDL RATIO: 2.5 ratio
CHOLESTEROL: 131 mg/dL (ref 0–200)
HDL: 52 mg/dL (ref >40)
LDL CALC: 62 mg/dL (ref 0–99)
Triglycerides: 86 mg/dL (ref <150)
VLDL: 17 mg/dL (ref 0–40)

## 2016-10-11 LAB — GLUCOSE, CAPILLARY
GLUCOSE-CAPILLARY: 249 mg/dL — AB (ref 65–99)
GLUCOSE-CAPILLARY: 254 mg/dL — AB (ref 65–99)
GLUCOSE-CAPILLARY: 440 mg/dL — AB (ref 65–99)
Glucose-Capillary: 371 mg/dL — ABNORMAL HIGH (ref 65–99)
Glucose-Capillary: 155 mg/dL — ABNORMAL HIGH (ref 65–99)
Glucose-Capillary: 189 mg/dL — ABNORMAL HIGH (ref 65–99)

## 2016-10-11 LAB — TROPONIN I
TROPONIN I: 0.31 ng/mL — AB (ref <0.03)
Troponin I: 0.36 ng/mL (ref <0.03)

## 2016-10-11 LAB — TSH: TSH: 1.703 u[IU]/mL (ref 0.350–4.500)

## 2016-10-11 LAB — ECHOCARDIOGRAM COMPLETE
Height: 65 in
WEIGHTICAEL: 2276.8 [oz_av]

## 2016-10-11 LAB — MRSA PCR SCREENING: MRSA by PCR: NEGATIVE

## 2016-10-11 MED ORDER — WHITE PETROLATUM GEL
Status: DC | PRN
  Administered 2016-10-11: 11:00:00 via TOPICAL
  Filled 2016-10-11: qty 1

## 2016-10-11 MED ORDER — INSULIN ASPART 100 UNIT/ML ~~LOC~~ SOLN
12.0000 [IU] | Freq: Once | SUBCUTANEOUS | Status: AC
  Administered 2016-10-11: 12 [IU] via SUBCUTANEOUS

## 2016-10-11 MED ORDER — LORAZEPAM 2 MG/ML IJ SOLN
0.5000 mg | INTRAMUSCULAR | Status: DC | PRN
  Administered 2016-10-11: 0.5 mg via INTRAVENOUS
  Filled 2016-10-11: qty 1

## 2016-10-11 MED ORDER — PERFLUTREN LIPID MICROSPHERE
1.0000 mL | INTRAVENOUS | Status: AC | PRN
  Administered 2016-10-11: 2 mL via INTRAVENOUS
  Filled 2016-10-11 (×2): qty 10

## 2016-10-11 NOTE — Evaluation (Signed)
Clinical/Bedside Swallow Evaluation Patient Details  Name: Nancy [name] MRN: [id number] Date of Birth: [birth year]  Today's Date: 10/11/2016 Time: SLP Start Time (ACUTE ONLY): [id number] SLP Stop Time (ACUTE ONLY): 0915 SLP Time Calculation (min) (ACUTE ONLY): 20 min  Past Medical History:  Past Medical History:  Diagnosis Date  . Anemia   . CAD (coronary atherosclerotic disease)   . Cardiac dysrhythmia, unspecified   . Colon cancer ([place]) 1989  . Depressive disorder   . Diabetes mellitus   . Hypertension   . Hypothyroidism   . Organic brain syndrome   . Syncope    Past Surgical History:  Past Surgical History:  Procedure Laterality Date  . COLON SURGERY    . COLONOSCOPY  12/27/2008   Dr. Hilma Favors 6-mm hepatic flexure polyps removed via snare cautery/ONE mm sessile hepatic flexure polyp removed/ONE sessile 6-mm transverse colon polyp removed/ONE sessile 3-mm transverse colon polyp removed/Rare diverticula seen/. The anastomosis is approximately 10 cm above the anal verge. Tubular adenomas.   . COLONOSCOPY N/A 02/14/2014   Procedure: COLONOSCOPY;  Surgeon: Danie Binder, MD;  [facility];  Service: Endoscopy;  Laterality: N/A;  12:45  . CORONARY ARTERY BYPASS GRAFT    . LACRIMAL TUBE INSERTION  05/14/2011   Procedure: LACRIMAL TUBE INSERTION;  Surgeon: Williams Che;  Location: [facility] ORS;  Service: Ophthalmology;  Laterality: Right;  Placement of Stent Right Lower Canaliculus   HPI:  Nancy [name] a 83 y.o.femalewho was taken [facility] following decreased verbal output. Bilateral lower extremity weakness and aphasia noted. Pt was not IV TPA candidate. She was [facility] to [facility]. Imaging with findings of apparent infarct located within the parasagittal left frontal lobe, left ACA territory. Surrounding penumbra within the adjacent left ACA territory with additional ischemic penumbra within the right ACA territory. MD also notes right parietal infarct. Pt wheelchair  bound at baseline, family reports she talks and interacts, though this has been decreasing over time.   Assessment / Plan / Recommendation Clinical Impression  Patient presents with moderate to severe aspiration risk due to reduced postural control, altered mentation, and decreased alertness. Patient alert upon SLP entry, follows 2 basic commands to open eyes, mouth. She is noted with dyskinesias: in response to oral care and PO presentation, patient with spontaneous movements of the lips, tongue and jaw. Patient manipulates boluses upon presentation via teaspoon, however she has poor awareness and suspected intermittent swallow delay. Ceased trials due to change in level of alertness. No overt signs of aspiration noted, however given poor control and awareness, recommend pt remain NPO at this time. Per MD notes, RN, family would like to pursue rehabilitation, though not aggressive procedures. Recommend patient to have ice chips for comfort after oral care, only when alert, positioned upright with head support. SLP will follow to determine readiness for POs, determine appropriateness of diet for comfort measures pending family consult.     Aspiration Risk  Moderate aspiration risk;Risk for inadequate nutrition/hydration    Diet Recommendation NPO;Ice chips PRN after oral care   Medication Administration: Via alternative means Postural Changes: Seated upright at 90 degrees    Other  Recommendations Oral Care Recommendations: Oral care QID Other Recommendations: Have oral suction available   Follow up Recommendations  (TBD)      Frequency and Duration min 2x/week  2 weeks       Prognosis Prognosis for Safe Diet Advancement: Fair Barriers to Reach Goals: Cognitive deficits;Severity of deficits      Swallow  Study   General Date of Onset: 10/11/16 HPI: Nancy [name] a 83 y.o.femalewho was taken [facility] following decreased verbal output. Bilateral lower extremity weakness and  aphasia noted. Pt was not IV TPA candidate. She was transferred to [facility]. Imaging with findings of apparent infarct located within the parasagittal left frontal lobe, left ACA territory. Surrounding penumbra within the adjacent left ACA territory with additional ischemic penumbra within the right ACA territory. MD also notes right parietal infarct. Pt wheelchair bound at baseline, family reports she talks and interacts, though this has been decreasing over time. Type of Study: Bedside Swallow Evaluation Previous Swallow Assessment: none per chart Diet Prior to this Study: NPO Temperature Spikes Noted: No Respiratory Status: Room air History of Recent Intubation: No Behavior/Cognition: Alert;Requires cueing Oral Cavity Assessment: Dry;Dried secretions Oral Care Completed by SLP: Yes Oral Cavity - Dentition: Poor condition;Missing dentition Vision: Impaired for self-feeding Self-Feeding Abilities: Total assist Patient Positioning: Postural control interferes with function;Postural control adequate for testing;Upright in bed Baseline Vocal Quality: Not observed Volitional Cough: Cognitively unable to elicit Volitional Swallow: Unable to elicit    Oral/Motor/Sensory Function Overall Oral Motor/Sensory Function: Other (comment) (Limited assessment due to reduced ability to follow commands) Facial ROM: Reduced right;Reduced left Facial Symmetry: Other (Comment) (symmetrical at rest) Lingual Symmetry: Within Functional Limits Lingual Strength: Reduced   Ice Chips Ice chips: Within functional limits   Thin Liquid Thin Liquid: Impaired Presentation: Spoon Oral Phase Impairments: Poor awareness of bolus;Reduced labial seal Oral Phase Functional Implications: Right anterior spillage Pharyngeal  Phase Impairments: Suspected delayed Swallow    Nectar Thick     Honey Thick Honey Thick Liquid: Impaired Presentation: Spoon Pharyngeal Phase Impairments: Suspected delayed Swallow   Puree Puree:  Impaired Presentation: Spoon Oral Phase Impairments: Poor awareness of bolus;Reduced lingual movement/coordination Pharyngeal Phase Impairments: Suspected delayed Swallow   Solid   GO Deneise Lever, [place] CF-SLP Speech-Language Pathologist [phone] Solid: Not tested        Aliene Altes 10/11/2016,9:52 AM

## 2016-10-11 NOTE — Progress Notes (Addendum)
The clients family called nursing staff about shaking mostly on the right side of the body lasting less than 30 seconds. Paged MD she ordered Ativan PRN for anxiety, seizure. Spoke to the niece (POA) about her care and the plan forward including the ordered med and an EEG. I will continue to watch the client closely.  Saddie Benders RN   Addendum:  While in the room to give Ativan the client started jerking on the left shoulder then it moved to all over the upper part of her body shaking for about 15 seconds. During this time the client stopped her continuous chewing motion that has been occurring while the client is awake. After the shaking stopped after about 30 seconds of being motionless she started chewing again.   Saddie Benders RN

## 2016-10-11 NOTE — Progress Notes (Signed)
  Echocardiogram 2D Echocardiogram with Definity has been performed.  Diamond Nickel 10/11/2016, 4:32 PM

## 2016-10-11 NOTE — Plan of Care (Signed)
Problem: Education: Goal: Knowledge of disease or condition will improve Outcome: Completed/Met Date Met: 10/11/16 Education with family   

## 2016-10-11 NOTE — Plan of Care (Signed)
Problem: Coping: Goal: Ability to identify appropriate support needs will improve Outcome: Progressing Patient's family educated at bedside about plan of care. Family wants to be supportive in any way possible.

## 2016-10-11 NOTE — Telephone Encounter (Signed)
Possible re-admission to facility. This is a patient you were seeing at [facility] . [facility] F/U is needed if patient was re-admitted to facility upon discharge. Hospital discharge from [facility] on 10/10/16.

## 2016-10-11 NOTE — Plan of Care (Signed)
Problem: Coping: Goal: Ability to identify appropriate support needs will improve Outcome: Progressing Niece and other family members at bedside very supportive and helping keep the client relaxed talking with her and holding her hand.

## 2016-10-11 NOTE — Evaluation (Signed)
Speech Language Pathology Evaluation Patient Details Name: Nancy [name] MRN: [id number] DOB: 1925/04/13 Today's Date: 10/11/2016 Time: [id number] SLP Time Calculation (min) (ACUTE ONLY): 14 min  Problem List:  Patient Active Problem List   Diagnosis Date Noted  . Right hemiparesis ([place]) 10/10/2016  . CKD (chronic kidney disease), stage III 10/10/2016  . Dementia 10/10/2016  . Diabetes mellitus type 2 with complications ([place]) [id number]  . CVA (cerebral vascular accident) ([place]) 10/10/2016  . Elevated troponin   . Anemia 06/04/2016  . Hearing loss 02/19/2016  . Type 2 diabetes with nephropathy ([place]) 11/26/2015  . Cough 11/19/2015  . Conjunctivitis 08/07/2014  . HTN (hypertension) 04/02/2011  . Hyperlipemia 04/02/2011  . Periorbital cellulitis 04/01/2011  . CLL (chronic lymphocytic leukemia) ([place]) 12/06/2008  . COLONIC POLYPS, RECURRENT 12/06/2008  . DEMENTIA, WITH DEPRESSION 12/06/2008  . CAD (coronary artery disease), native coronary artery 12/06/2008  . Cerebral artery occlusion with cerebral infarction ([place]) 12/06/2008  . DYSPHAGIA DUE TO CEREBROVASCULAR DISEASE 12/06/2008  . GERD 12/06/2008  . Chronic kidney disease 12/06/2008  . History of malignant neoplasm of large intestine 12/06/2008  . PERSONAL HX OF METHICILLIN RESIST STAPH AUREUS 12/06/2008  . HELICOBACTER PYLORI GASTRITIS, HX OF 12/06/2008   Past Medical History:  Past Medical History:  Diagnosis Date  . Anemia   . CAD (coronary atherosclerotic disease)   . Cardiac dysrhythmia, unspecified   . Colon cancer ([place]) 1989  . Depressive disorder   . Diabetes mellitus   . Hypertension   . Hypothyroidism   . Organic brain syndrome   . Syncope    Past Surgical History:  Past Surgical History:  Procedure Laterality Date  . COLON SURGERY    . COLONOSCOPY  12/27/2008   Dr. Hilma Favors 6-mm hepatic flexure polyps removed via snare cautery/ONE mm sessile hepatic flexure polyp removed/ONE sessile 6-mm transverse  colon polyp removed/ONE sessile 3-mm transverse colon polyp removed/Rare diverticula seen/. The anastomosis is approximately 10 cm above the anal verge. Tubular adenomas.   . COLONOSCOPY N/A 02/14/2014   Procedure: COLONOSCOPY;  Surgeon: Danie Binder, MD;  Location: [facility];  Service: Endoscopy;  Laterality: N/A;  12:45  . CORONARY ARTERY BYPASS GRAFT    . LACRIMAL TUBE INSERTION  05/14/2011   Procedure: LACRIMAL TUBE INSERTION;  Surgeon: Williams Che;  Location: [facility] ORS;  Service: Ophthalmology;  Laterality: Right;  Placement of Stent Right Lower Canaliculus   HPI:  Nancy Wissner Hamletis a 83 y.o.femalewho was taken [facility] following decreased verbal output. Bilateral lower extremity weakness and aphasia noted. Pt was not IV TPA candidate. She was [facility]. Imaging with findings of apparent infarct located within the parasagittal left frontal lobe, left ACA territory. Surrounding penumbra within the adjacent left ACA territory with additional ischemic penumbra within the right ACA territory. MD also notes right parietal infarct. Pt wheelchair bound at baseline, family reports she talks and interacts, though this has been decreasing over time.   Assessment / Plan / Recommendation Clinical Impression  Patient presents with communication impairment characterized by reduced ability to follow basic commands, impaired expression of wants and needs, and disorientation. With max support patient is able to confirm her name with subtle head movement/eye gaze in response to Y/N question. Responds only to verbal cues for opening eyes, mouth. Patient is not observed to vocalize or attempt verbal communication despite cues. She will benefit from skilled SLP services to address above deficits and to maximize functional communication related to medical care and interaction with  family/friends.    SLP Assessment  Patient needs continued Speech Lanaguage Pathology Services    Follow Up  Recommendations  Other (comment) (TBD)    Frequency and Duration min 2x/week  2 weeks      SLP Evaluation Cognition  Overall Cognitive Status: Impaired/Different from baseline Arousal/Alertness: Lethargic Orientation Level: Disoriented X4 Attention: Sustained Sustained Attention: Impaired Sustained Attention Impairment: Functional basic [employer] Scales of Cognitive Functioning: Localized response       Comprehension  Auditory Comprehension Overall Auditory Comprehension: Impaired Yes/No Questions: Impaired Basic Biographical Questions: 26-50% accurate Commands: Impaired One Step Basic Commands: 25-49% accurate Visual Recognition/Discrimination Discrimination: Not tested Reading Comprehension Reading Status: Not tested    Expression Expression Primary Mode of Expression: Nonverbal - gestures (head nod x1) Verbal Expression Overall Verbal Expression: Impaired Non-Verbal Means of Communication: Gestures;Eye gaze (x1) Written Expression Dominant Hand:  (unable to confirm) Written Expression: Not tested   Oral / Motor  Oral Motor/Sensory Function Overall Oral Motor/Sensory Function: Other (comment) (limited assessment due to reduced ability to follow commands) Facial ROM: Reduced right;Reduced left Facial Symmetry: Other (Comment) Lingual Symmetry: Within Functional Limits Lingual Strength: Reduced Motor Speech Overall Motor Speech: Impaired   [place], [place] CF-SLP Speech-Language Pathologist [phone]  Aliene Altes 10/11/2016, 10:11 AM

## 2016-10-11 NOTE — Progress Notes (Addendum)
Patient ID: Nancy [name], female   DOB: 1925/08/04, 83 y.o.   MRN: [id number]    PROGRESS NOTE    Ladetra Torrens Panther  [id number] DOB: 08/19/1925 DOA: 10/10/2016  PCP: Virgie Dad, MD   Brief HPI: 83 y.o. female with medical history of diabetes mellitus, hypertension,  and coronary artery disease presented from [place] with onset of aphasia. Apparently, the patient ate breakfast and was conversant at the time on the morning of 10/10/2016. After breakfast, the nursing staff checked back on the patient, and they noted that the patient was aphasic.   Assessment/Plan: Aphasia and right hemiparesis -R MCA stroke -neurology consulted, recommend Palliative care consultation   Hypertension -Allow for permissive hypertension -Hydralazine when necessary SBP >220 or DBP >120 -Hold lisinopril  CKD Stage 3 -Baseline creatinine 1.2-1.4  Diabetes mellitus type 2 -NovoLog sliding scale -Hold Amaryl and Januvia -07/04/2016 hemoglobin A1c 7.1  Hyperlipidemia -Continue Zocor -Lipid panel  Dementia -Continue Aricept and Namenda  Elevated Troponin -Likely demand ischemia -EKG with unchanged IRBBB  DVT prophylaxis: Lovenox SQ Code Status: Full  Family Communication: no family at bedside  Disposition Plan: to be determined   Consultants:   Neurology  PCT  Procedures:   None  Antimicrobials:   None  Subjective: No events overnight.   Objective: Vitals:   10/11/16 0556 10/11/16 0748 10/11/16 1126 10/11/16 1617  BP: (!) 160/62 (!) 150/60 (!) 126/99   Pulse:  99 (!) 112   Resp: (!) 30 (!) 26 (!) 30   Temp:  98.7 F (37.1 C) 98.9 F (37.2 C) 99.6 F (37.6 C)  TempSrc:  Axillary Axillary Axillary  SpO2:  98% 98%   Weight:      Height:        Intake/Output Summary (Last 24 hours) at 10/11/16 1657 Last data filed at 10/11/16 1600  Gross per 24 hour  Intake           201.41 ml  Output                0 ml  Net           201.41 ml   Filed  Weights   10/10/16 1041 10/10/16 2040 10/11/16 0329  Weight: 63.5 kg (140 lb) 64.5 kg (142 lb 1.6 oz) 64.5 kg (142 lb 4.8 oz)    Examination:  General exam: Appears calm and comfortable  Respiratory system: Clear to auscultation. Respiratory effort normal. Cardiovascular system: S1 & S2 heard, RRR. No JVD, murmurs, rubs, gallops or clicks. No pedal edema. Gastrointestinal system: Abdomen is nondistended, soft and nontender. No organomegaly or masses felt. Normal bowel sounds heard. Central nervous system: Alert and oriented. No focal neurological deficits. Extremities: Symmetric 5 x 5 power. Skin: No rashes, lesions or ulcers Psychiatry: Judgement and insight appear normal. Mood & affect appropriate.    Data Reviewed: I have personally reviewed following labs and imaging studies  CBC:  Recent Labs Lab 10/10/16 1107 10/10/16 1131  WBC  --  6.6  NEUTROABS  --  3.0  HGB 13.3 12.5  HCT 39.0 38.9  MCV  --  88.6  PLT  --  123456*   Basic Metabolic Panel:  Recent Labs Lab 10/10/16 1107 10/10/16 1131  NA 145 141  K 4.2 3.7  CL 108 106  CO2  --  27  GLUCOSE 308* 291*  BUN 40* 31*  CREATININE 1.50* 1.36*  CALCIUM  --  10.1   Liver Function Tests:  Recent Labs  Lab 10/10/16 1131  AST 27  ALT 27  ALKPHOS 94  BILITOT 0.5  PROT 7.8  ALBUMIN 3.8    Recent Labs Lab 10/10/16 1206  AMMONIA 22   Coagulation Profile:  Recent Labs Lab 10/10/16 1229  INR 1.04   Cardiac Enzymes:  Recent Labs Lab 10/10/16 1607 10/11/16 0040 10/11/16 1330  TROPONINI 0.10* 0.31* 0.36*   CBG:  Recent Labs Lab 10/11/16 0012 10/11/16 0332 10/11/16 0746 10/11/16 1116 10/11/16 1615  GLUCAP 440* 371* 254* 155* 189*   Lipid Profile:  Recent Labs  10/11/16 0040  CHOL 131  HDL 52  LDLCALC 62  TRIG 86  CHOLHDL 2.5   Thyroid Function Tests:  Recent Labs  10/11/16 0040  TSH 1.703   Urine analysis:    Component Value Date/Time   COLORURINE STRAW (A) 10/10/2016  1055   APPEARANCEUR CLEAR 10/10/2016 1055   LABSPEC 1.014 10/10/2016 1055   PHURINE 7.0 10/10/2016 1055   GLUCOSEU >=500 (A) 10/10/2016 1055   HGBUR NEGATIVE 10/10/2016 1055   BILIRUBINUR NEGATIVE 10/10/2016 1055   KETONESUR NEGATIVE 10/10/2016 1055   PROTEINUR 100 (A) 10/10/2016 1055   UROBILINOGEN 0.2 08/07/2008 0025   NITRITE NEGATIVE 10/10/2016 1055   LEUKOCYTESUR NEGATIVE 10/10/2016 1055   Recent Results (from the past 240 hour(s))  Blood culture (routine x 2)     Status: None (Preliminary result)   Collection Time: 10/10/16 11:35 AM  Result Value Ref Range Status   Specimen Description BLOOD RIGHT HAND  Final   Special Requests BOTTLES DRAWN AEROBIC ONLY 6cc  Final   Culture NO GROWTH < 24 HOURS  Final   Report Status PENDING  Incomplete  Blood culture (routine x 2)     Status: None (Preliminary result)   Collection Time: 10/10/16 12:06 PM  Result Value Ref Range Status   Specimen Description BLOOD LEFT ARM  Final   Special Requests BOTTLES DRAWN AEROBIC AND ANAEROBIC 6CC  Final   Culture NO GROWTH < 24 HOURS  Final   Report Status PENDING  Incomplete  MRSA PCR Screening     Status: None   Collection Time: 10/10/16  8:46 PM  Result Value Ref Range Status   MRSA by PCR NEGATIVE NEGATIVE Final    Radiology Studies: Ct Angio Head W Or Wo Contrast  Result Date: 10/10/2016 CLINICAL DATA:  Initial evaluation for acute aphasia. EXAM: CT ANGIOGRAPHY HEAD AND NECK TECHNIQUE: Multidetector CT imaging of the head and neck was performed using the standard protocol during bolus administration of intravenous contrast. Multiplanar CT image reconstructions and MIPs were obtained to evaluate the vascular anatomy. Carotid stenosis measurements (when applicable) are obtained utilizing NASCET criteria, using the distal internal carotid diameter as the denominator. CONTRAST:  100 cc of Isovue 370.  Prior COMPARISON:  Comparison made with prior noncontrast head CT and MRI/MRA from earlier the same  day. FINDINGS: CTA NECK FINDINGS Aortic arch: Visualized aortic arch of normal caliber with normal 3 vessel morphology. Scattered atheromatous plaque within the arch and about the origin of the great vessels without flow-limiting stenosis. Mild atheromatous narrowing at the proximal left subclavian artery of approximately 25% noted. Visualized subclavian arteries otherwise widely patent. Right carotid system: Right common carotid artery tortuous proximally. Mild scattered plaque within the distal common carotid artery without flow-limiting stenosis. Minimal plaque about the right bifurcation without significant narrowing. Right ICA mildly tortuous but widely patent distally to the skullbase without stenosis, dissection, or occlusion. Left carotid system: Left common carotid artery tortuous proximally.  Mild scattered plaque within the distal left common carotid artery without significant stenosis. Left ICA patent distally and without stenosis, dissection, or occlusion. Vertebral arteries: Both of the vertebral arteries arise from the subclavian arteries. Focal plaque at multifocal mild to moderate narrowing within the left V2 segment noted. No high-grade flow-limiting stenosis within the vertebral arteries. No evidence for vascular occlusion or occlusion. Skeleton: No acute osseous abnormality. Moderate degenerative spondylolysis noted at C6-7. Visualized osseous structures demonstrate a somewhat mottled appearance without focal worrisome lytic or blastic osseous lesion. Other neck: Visualized soft tissues of the neck demonstrate no acute abnormality. No adenopathy. Thyroid within normal limits. Upper chest: Visualized upper mediastinum within normal limits. Visualized lungs are clear. Review of the MIP images confirms the above findings CTA HEAD FINDINGS Anterior circulation: Mild scattered plaque within the horizontal petrous segments bilaterally without flow-limiting stenosis. Extensive smooth atheromatous plaque  throughout the cavernous/ supraclinoid segments with moderate diffuse narrowing. Right A1 segment dominant and widely patent. There is a small hypoplastic left A1 segment, grossly patent. Anterior communicating artery grossly normal. Abrupt occlusion of proximal left A2 segment (series 502, image 82). Irregularity distal reconstitution with patchy flow seen distally (series 502, image 73). Additionally, there is apparent occlusion of the proximal right A2 segment at the same level (series 502, image 75). Distal reconstitution with attenuated thready flow distally. Short azygos ACA could be considered. No M1 occlusion identified. Moderate stenosis distal right M1 segment. Moderate narrowing mid left M1 segment. The MCA bifurcations within normal limits. Distal MCA branches demonstrate multifocal atheromatous irregularity but are well perfused and fairly symmetric. Posterior circulation: Right V4 segment mildly irregular but patent to the vertebrobasilar junction without high-grade stenosis. Multifocal moderate to severe left V4 stenoses present, most notable just prior to the vertebrobasilar junction. Moderate to advanced narrowing at the vertebrobasilar junction itself. Posterior inferior cerebral arteries patent bilaterally. Basilar artery demonstrates multifocal atheromatous irregularity. Superimposed moderate stenosis of approximately 50% within the mid basilar artery (series 503, image 107). Superior cerebral arteries patent bilaterally. Right PCA mildly irregular but widely patent to its distal aspect. Left P1 segment somewhat hypoplastic. Left PCA patent distally without high-grade stenosis. Venous sinuses: Patent. Anatomic variants: No significant anatomic variant. No aneurysm or vascular malformation. Delayed phase: Not performed. Review of the MIP images confirms the above findings CT Brain Perfusion Findings: CBF (<30%) Volume: 8mL Perfusion (Tmax>6.0s) volume: 76mL Mismatch Volume: 75mL Infarction  Location:Apparent infarct is located within the parasagittal left frontal lobe, left ACA territory. Surrounding penumbra within the adjacent left ACA territory with additional ischemic penumbra within the right ACA territory. Perfusion abnormality consistent with azygos ACA occlusion seen on prior corresponding CTA. IMPRESSION: 1. Small acute left ACA territory infarct. Surrounding penumbra involving bilateral ACA territories as above. 2. Positive CTA for emergent large vessel occlusion involving probable proximal azygos ACA, with distal reconstitution of bilateral A2 segments distally. Distal flow is irregular and attenuated, particularly on the right. 3. Extensive atheromatous disease throughout the carotid siphons with moderate diffuse narrowing. 4. Extensive atheromatous disease involving the posterior circulation, with multifocal moderate to severe left V4 stenoses, with additional moderate stenosis of the mid basilar artery. 5. Mild atheromatous disease involving the carotid arteries within the neck, with no flow-limiting stenosis identified. 6. Moderate stenosis at the origin of the vertebral arteries bilaterally. Critical Value/emergent results were called by telephone at the time of interpretation on 10/10/2016 at 5:27 pm to Dr. Roland Rack , who verbally acknowledged these results. Electronically Signed   By: Marland Kitchen  Jeannine Boga M.D.   On: 10/10/2016 18:10   Ct Head Wo Contrast  Result Date: 10/10/2016 CLINICAL DATA:  Acute onset aphasia and lower extremity weakness this morning. EXAM: CT HEAD WITHOUT CONTRAST TECHNIQUE: Contiguous axial images were obtained from the base of the skull through the vertex without intravenous contrast. COMPARISON:  08/07/2008 FINDINGS: Brain: No evidence of hemorrhage, hydrocephalus, extra-axial collection or mass lesion/mass effect. Stable mild cerebral and cerebellar atrophy. Stable appearance of old bilateral cerebellar and high right parietal infarcts. Moderate  chronic small vessel disease again noted. Focal decreased attenuation is seen in the right temporoparietal region which was not seen on previous study. This is fairly well defined, and suspicious for a subacute or chronic infarct. No definite acute cerebral infarct identified. Vascular: No hyperdense vessel or unexpected calcification. Skull: Normal. Negative for fracture or focal lesion. Sinuses/Orbits: Chronic right mastoid effusion again noted. Other: None. IMPRESSION: Age-indeterminate right temporoparietal infarct, new since 2009 exam, likely subacute or chronic in age. Consider brain MRI without and with contrast for further evaluation. Diffuse cerebral and cerebellar atrophy, chronic small vessel disease, and old bilateral cerebellar and high right parietal infarcts. Electronically Signed   By: Earle Gell M.D.   On: 10/10/2016 12:09   Ct Angio Neck W Or Wo Contrast  Result Date: 10/10/2016 CLINICAL DATA:  Initial evaluation for acute aphasia. EXAM: CT ANGIOGRAPHY HEAD AND NECK TECHNIQUE: Multidetector CT imaging of the head and neck was performed using the standard protocol during bolus administration of intravenous contrast. Multiplanar CT image reconstructions and MIPs were obtained to evaluate the vascular anatomy. Carotid stenosis measurements (when applicable) are obtained utilizing NASCET criteria, using the distal internal carotid diameter as the denominator. CONTRAST:  100 cc of Isovue 370.  Prior COMPARISON:  Comparison made with prior noncontrast head CT and MRI/MRA from earlier the same day. FINDINGS: CTA NECK FINDINGS Aortic arch: Visualized aortic arch of normal caliber with normal 3 vessel morphology. Scattered atheromatous plaque within the arch and about the origin of the great vessels without flow-limiting stenosis. Mild atheromatous narrowing at the proximal left subclavian artery of approximately 25% noted. Visualized subclavian arteries otherwise widely patent. Right carotid system:  Right common carotid artery tortuous proximally. Mild scattered plaque within the distal common carotid artery without flow-limiting stenosis. Minimal plaque about the right bifurcation without significant narrowing. Right ICA mildly tortuous but widely patent distally to the skullbase without stenosis, dissection, or occlusion. Left carotid system: Left common carotid artery tortuous proximally. Mild scattered plaque within the distal left common carotid artery without significant stenosis. Left ICA patent distally and without stenosis, dissection, or occlusion. Vertebral arteries: Both of the vertebral arteries arise from the subclavian arteries. Focal plaque at multifocal mild to moderate narrowing within the left V2 segment noted. No high-grade flow-limiting stenosis within the vertebral arteries. No evidence for vascular occlusion or occlusion. Skeleton: No acute osseous abnormality. Moderate degenerative spondylolysis noted at C6-7. Visualized osseous structures demonstrate a somewhat mottled appearance without focal worrisome lytic or blastic osseous lesion. Other neck: Visualized soft tissues of the neck demonstrate no acute abnormality. No adenopathy. Thyroid within normal limits. Upper chest: Visualized upper mediastinum within normal limits. Visualized lungs are clear. Review of the MIP images confirms the above findings CTA HEAD FINDINGS Anterior circulation: Mild scattered plaque within the horizontal petrous segments bilaterally without flow-limiting stenosis. Extensive smooth atheromatous plaque throughout the cavernous/ supraclinoid segments with moderate diffuse narrowing. Right A1 segment dominant and widely patent. There is a small hypoplastic left A1 segment, grossly  patent. Anterior communicating artery grossly normal. Abrupt occlusion of proximal left A2 segment (series 502, image 82). Irregularity distal reconstitution with patchy flow seen distally (series 502, image 73). Additionally, there  is apparent occlusion of the proximal right A2 segment at the same level (series 502, image 75). Distal reconstitution with attenuated thready flow distally. Short azygos ACA could be considered. No M1 occlusion identified. Moderate stenosis distal right M1 segment. Moderate narrowing mid left M1 segment. The MCA bifurcations within normal limits. Distal MCA branches demonstrate multifocal atheromatous irregularity but are well perfused and fairly symmetric. Posterior circulation: Right V4 segment mildly irregular but patent to the vertebrobasilar junction without high-grade stenosis. Multifocal moderate to severe left V4 stenoses present, most notable just prior to the vertebrobasilar junction. Moderate to advanced narrowing at the vertebrobasilar junction itself. Posterior inferior cerebral arteries patent bilaterally. Basilar artery demonstrates multifocal atheromatous irregularity. Superimposed moderate stenosis of approximately 50% within the mid basilar artery (series 503, image 107). Superior cerebral arteries patent bilaterally. Right PCA mildly irregular but widely patent to its distal aspect. Left P1 segment somewhat hypoplastic. Left PCA patent distally without high-grade stenosis. Venous sinuses: Patent. Anatomic variants: No significant anatomic variant. No aneurysm or vascular malformation. Delayed phase: Not performed. Review of the MIP images confirms the above findings CT Brain Perfusion Findings: CBF (<30%) Volume: 27mL Perfusion (Tmax>6.0s) volume: 88mL Mismatch Volume: 42mL Infarction Location:Apparent infarct is located within the parasagittal left frontal lobe, left ACA territory. Surrounding penumbra within the adjacent left ACA territory with additional ischemic penumbra within the right ACA territory. Perfusion abnormality consistent with azygos ACA occlusion seen on prior corresponding CTA. IMPRESSION: 1. Small acute left ACA territory infarct. Surrounding penumbra involving bilateral ACA  territories as above. 2. Positive CTA for emergent large vessel occlusion involving probable proximal azygos ACA, with distal reconstitution of bilateral A2 segments distally. Distal flow is irregular and attenuated, particularly on the right. 3. Extensive atheromatous disease throughout the carotid siphons with moderate diffuse narrowing. 4. Extensive atheromatous disease involving the posterior circulation, with multifocal moderate to severe left V4 stenoses, with additional moderate stenosis of the mid basilar artery. 5. Mild atheromatous disease involving the carotid arteries within the neck, with no flow-limiting stenosis identified. 6. Moderate stenosis at the origin of the vertebral arteries bilaterally. Critical Value/emergent results were called by telephone at the time of interpretation on 10/10/2016 at 5:27 pm to Dr. Roland Rack , who verbally acknowledged these results. Electronically Signed   By: Jeannine Boga M.D.   On: 10/10/2016 18:10   Mr Virgel Paling [id number] Contrast  Result Date: 10/10/2016 CLINICAL DATA:  83 year old female with altered mental status and bilateral lower extremity weakness, but right arm weakness and aphasia suggesting a left hemisphere abnormality. Initial encounter. EXAM: MRA HEAD WITHOUT CONTRAST TECHNIQUE: Angiographic images of the Circle of Willis were obtained using MRA technique without intravenous contrast. COMPARISON:  Brain MRI from today reported separately. Intracranial MRA 08/06/2006. FINDINGS: Study is significantly degraded by motion artifact despite repeated imaging attempts. The basilar artery and ICA siphons are patent. The basilar tip and carotid termini are patent. The left A1 segment is non dominant while the right is dominant and appears stable. The ACAs distal to the anterior communicating artery are poorly evaluated. Both PCAs appear patent and stable since 2007. Both MCA M1 segments are patent. The right MCA bifurcation and proximal M2 branches  appear stable compared to 2007. The left MCA bifurcation also appears patent, and the proximal left M2 branches appear stable except  for a questionable flow gap at the origin of the dominant left posterior sylvian division as seen on series 109, image 18. However, the preserved distal flow signal in that vessel seems normal and I suspect this finding is artifact. IMPRESSION: Intracranial MRA is significantly degraded by motion artifact. No strong evidence of emergent large vessel occlusion. This was discussed by telephone with Dr. Roland Rack on 10/10/2016 at 1540 hours. We discussed how an intracranial CTA would be complementary so long as a better quality exam could be obtained. Electronically Signed   By: Genevie Ann M.D.   On: 10/10/2016 15:57   Mr Brain Wo Contrast  Result Date: 10/10/2016 CLINICAL DATA:  83 year old female with altered mental status and bilateral lower extremity weakness, but right arm weakness and aphasia suggesting a left hemisphere abnormality. Initial encounter. EXAM: MRI HEAD WITHOUT CONTRAST TECHNIQUE: Multiplanar, multiecho pulse sequences of the brain and surrounding structures were obtained without intravenous contrast. COMPARISON:  Head CT without contrast 1149 hours today. Brain MRI and intracranial MRA 08/06/2006. FINDINGS: Study is intermittently degraded by motion artifact despite repeated imaging attempts. Brain: Chronic bilateral cerebellar infarcts. Chronic Patchy and confluent bilateral cerebral white matter T2 and FLAIR hyperintensity, including bilateral deep white matter capsule involvement. There is a small area of chronic cortical encephalomalacia in the superior right parietal lobe. Chronic bilateral deep gray matter T2 heterogeneity appears not significantly changed since 2007. Patchy and confluent pontine T2 heterogeneity also appears not significantly changed. Patchy 2 cm area of restricted diffusion in the posterior right temporal lobe corresponding to the  noncontrast CT finding earlier today. Associated T2 and FLAIR hyperintensity without hemorrhage or mass effect. There is also a questionable subcentimeter superimposed posterior right parietal lobe area of restricted diffusion on series 5, image 39. T2 shine through in the bilateral superior frontal gyri white matter. No convincing left side restricted diffusion. No posterior fossa restricted diffusion. No ventriculomegaly. No midline shift, mass effect, or evidence of intracranial mass lesion. Normal basilar cisterns. Grossly negative pituitary and cervicomedullary junction. Vascular: Major intracranial vascular flow voids are not well evaluated but appear grossly stable since 2007. Skull and upper cervical spine: Grossly negative. Sinuses/Orbits: Grossly negative orbits soft tissues. Paranasal sinuses are clear. There are chronic bilateral mastoid effusions which have not significantly changed since 2007. Negative nasopharynx. Other: Negative scalp soft tissues. IMPRESSION: 1. Patchy 2 cm area of right MCA territory ischemia in the posterior right temporal lobe with no hemorrhage or mass effect. Possible superimposed acute cortical lacune in the right parietal lobe. 2. No other acute ischemia identified, specifically no convincing left side diffusion abnormality. 3. Underlying advanced chronic ischemic disease otherwise appears not significantly changed since 2007. 4. Study discussed by telephone with Dr. Roland Rack at 1540 hours. Electronically Signed   By: Genevie Ann M.D.   On: 10/10/2016 15:50   Ct Cerebral Perfusion W Contrast  Result Date: 10/10/2016 CLINICAL DATA:  Initial evaluation for acute aphasia. EXAM: CT ANGIOGRAPHY HEAD AND NECK TECHNIQUE: Multidetector CT imaging of the head and neck was performed using the standard protocol during bolus administration of intravenous contrast. Multiplanar CT image reconstructions and MIPs were obtained to evaluate the vascular anatomy. Carotid stenosis  measurements (when applicable) are obtained utilizing NASCET criteria, using the distal internal carotid diameter as the denominator. CONTRAST:  100 cc of Isovue 370.  Prior COMPARISON:  Comparison made with prior noncontrast head CT and MRI/MRA from earlier the same day. FINDINGS: CTA NECK FINDINGS Aortic arch: Visualized aortic arch of  normal caliber with normal 3 vessel morphology. Scattered atheromatous plaque within the arch and about the origin of the great vessels without flow-limiting stenosis. Mild atheromatous narrowing at the proximal left subclavian artery of approximately 25% noted. Visualized subclavian arteries otherwise widely patent. Right carotid system: Right common carotid artery tortuous proximally. Mild scattered plaque within the distal common carotid artery without flow-limiting stenosis. Minimal plaque about the right bifurcation without significant narrowing. Right ICA mildly tortuous but widely patent distally to the skullbase without stenosis, dissection, or occlusion. Left carotid system: Left common carotid artery tortuous proximally. Mild scattered plaque within the distal left common carotid artery without significant stenosis. Left ICA patent distally and without stenosis, dissection, or occlusion. Vertebral arteries: Both of the vertebral arteries arise from the subclavian arteries. Focal plaque at multifocal mild to moderate narrowing within the left V2 segment noted. No high-grade flow-limiting stenosis within the vertebral arteries. No evidence for vascular occlusion or occlusion. Skeleton: No acute osseous abnormality. Moderate degenerative spondylolysis noted at C6-7. Visualized osseous structures demonstrate a somewhat mottled appearance without focal worrisome lytic or blastic osseous lesion. Other neck: Visualized soft tissues of the neck demonstrate no acute abnormality. No adenopathy. Thyroid within normal limits. Upper chest: Visualized upper mediastinum within normal  limits. Visualized lungs are clear. Review of the MIP images confirms the above findings CTA HEAD FINDINGS Anterior circulation: Mild scattered plaque within the horizontal petrous segments bilaterally without flow-limiting stenosis. Extensive smooth atheromatous plaque throughout the cavernous/ supraclinoid segments with moderate diffuse narrowing. Right A1 segment dominant and widely patent. There is a small hypoplastic left A1 segment, grossly patent. Anterior communicating artery grossly normal. Abrupt occlusion of proximal left A2 segment (series 502, image 82). Irregularity distal reconstitution with patchy flow seen distally (series 502, image 73). Additionally, there is apparent occlusion of the proximal right A2 segment at the same level (series 502, image 75). Distal reconstitution with attenuated thready flow distally. Short azygos ACA could be considered. No M1 occlusion identified. Moderate stenosis distal right M1 segment. Moderate narrowing mid left M1 segment. The MCA bifurcations within normal limits. Distal MCA branches demonstrate multifocal atheromatous irregularity but are well perfused and fairly symmetric. Posterior circulation: Right V4 segment mildly irregular but patent to the vertebrobasilar junction without high-grade stenosis. Multifocal moderate to severe left V4 stenoses present, most notable just prior to the vertebrobasilar junction. Moderate to advanced narrowing at the vertebrobasilar junction itself. Posterior inferior cerebral arteries patent bilaterally. Basilar artery demonstrates multifocal atheromatous irregularity. Superimposed moderate stenosis of approximately 50% within the mid basilar artery (series 503, image 107). Superior cerebral arteries patent bilaterally. Right PCA mildly irregular but widely patent to its distal aspect. Left P1 segment somewhat hypoplastic. Left PCA patent distally without high-grade stenosis. Venous sinuses: Patent. Anatomic variants: No  significant anatomic variant. No aneurysm or vascular malformation. Delayed phase: Not performed. Review of the MIP images confirms the above findings CT Brain Perfusion Findings: CBF (<30%) Volume: 55mL Perfusion (Tmax>6.0s) volume: 53mL Mismatch Volume: 62mL Infarction Location:Apparent infarct is located within the parasagittal left frontal lobe, left ACA territory. Surrounding penumbra within the adjacent left ACA territory with additional ischemic penumbra within the right ACA territory. Perfusion abnormality consistent with azygos ACA occlusion seen on prior corresponding CTA. IMPRESSION: 1. Small acute left ACA territory infarct. Surrounding penumbra involving bilateral ACA territories as above. 2. Positive CTA for emergent large vessel occlusion involving probable proximal azygos ACA, with distal reconstitution of bilateral A2 segments distally. Distal flow is irregular and attenuated, particularly on the  right. 3. Extensive atheromatous disease throughout the carotid siphons with moderate diffuse narrowing. 4. Extensive atheromatous disease involving the posterior circulation, with multifocal moderate to severe left V4 stenoses, with additional moderate stenosis of the mid basilar artery. 5. Mild atheromatous disease involving the carotid arteries within the neck, with no flow-limiting stenosis identified. 6. Moderate stenosis at the origin of the vertebral arteries bilaterally. Critical Value/emergent results were called by telephone at the time of interpretation on 10/10/2016 at 5:27 pm to Dr. Roland Rack , who verbally acknowledged these results. Electronically Signed   By: Jeannine Boga M.D.   On: 10/10/2016 18:10   Dg Chest Port 1 View  Result Date: 10/10/2016 CLINICAL DATA:  Altered mental status EXAM: PORTABLE CHEST 1 VIEW COMPARISON:  11/14/2015 FINDINGS: Previous median sternotomy and CABG procedure. Mild cardiac enlargement. Aortic atherosclerosis. There is a small left pleural  effusion. No interstitial edema or airspace opacities. IMPRESSION: 1. Small left pleural effusion. Electronically Signed   By: Kerby Moors M.D.   On: 10/10/2016 11:12      Scheduled Meds: .  stroke: mapping our early stages of recovery book   Does not apply Once  . aspirin  300 mg Rectal Daily   Or  . aspirin  325 mg Oral Daily  . atorvastatin  20 mg Oral QHS  . donepezil  10 mg Oral Daily  . enoxaparin (LOVENOX) injection  30 mg Subcutaneous Q24H  . insulin aspart  0-9 Units Subcutaneous Q4H  . memantine  5 mg Oral BID   Continuous Infusions: . sodium chloride    . diltiazem (CARDIZEM) infusion 5 mg/hr (10/11/16 1424)     LOS: 1 day   Time spent: 20 minutes   Faye Ramsay, MD [facility] Pager [phone]  If 7PM-7AM, please contact night-coverage [email or web address] Password [facility] 10/11/2016, 4:57 PM

## 2016-10-11 NOTE — Progress Notes (Signed)
STROKE TEAM PROGRESS NOTE   HISTORY OF PRESENT ILLNESS (per record) Nancy [name] is a 83 y.o. female who was last her normal state of health sometime yesterday ([place] 10/09/2016, time unknown), when she started having decreased verbal output. Today to 09/28/2016, there is concern for bilateral lower extremity weakness and aphasia and therefore the patient was taken [facility]. She is not IV TPA candidate given that she had last been seen normal the day before but due to the severity of her symptoms, the hospitalist called me to ask about possible further options. She was already in the MRI suite when I was called and therefore an MRA was performed. The MRI did demonstrate a moderate size right parietal infarct. Though no definite conclusion was present, I did feel that there is a possibility of a nonocclusive thrombus in the M1. Also the study was very poor quality. She was therefore transferred to [facility].  She was transferred down for CT perfusion/CT angiogram which was performed and did demonstrate ACA occlusion with some penumbra.  I had a long discussion with the niece who is the healthcare power of attorney. At baseline, the patient is predominantly wheelchair bound, able to walk only a little bit predominantly with assistance. He spends most days in bed, but occasionally does get up and have "good days." She does talk and interact, though this is been decreasing over time.  Given her baseline, and the fact that she has a definite parietal infarct which is likely going to worsen her baseline to some degree, I discussed with the niece about whether she would want to be aggressive in a situation like this and after discussion decided that we would not take her for any aggressive procedures. I also discussed CODE STATUS, and in the event of cardiac arrest, she would not want resuscitation, but would want full support up to the point of that. Premorbid modified rankin scale: 4. Patient was not  administered IV t-PA secondary to being out of the window. She was admitted  for further evaluation and treatment.   SUBJECTIVE (INTERVAL HISTORY) Two family members are at the bedside. Pt POA was niece Nancy [name] who was not at bedside. I tried to called her twice and it went to VM but can not leave message due to VM is full.   Pt obviously at advance dementia, as per family, not much communication at baseline, wheelchair or bed bound in NH. Currently mourning, not following commands. I think palliative care for her is the best course.    OBJECTIVE Temp:  [97.6 F (36.4 C)-99.1 F (37.3 C)] 98.9 F (37.2 C) (02/02 1126) Pulse Rate:  [81-155] 112 (02/02 1126) Cardiac Rhythm: Atrial fibrillation (02/02 0815) Resp:  [14-34] 30 (02/02 1126) BP: (126-244)/(59-149) 126/99 (02/02 1126) SpO2:  [96 %-100 %] 98 % (02/02 1126) Weight:  [64.5 kg (142 lb 1.6 oz)-64.5 kg (142 lb 4.8 oz)] 64.5 kg (142 lb 4.8 oz) (02/02 0329)  CBC:  Recent Labs Lab 10/10/16 1107 10/10/16 1131  WBC  --  6.6  NEUTROABS  --  3.0  HGB 13.3 12.5  HCT 39.0 38.9  MCV  --  88.6  PLT  --  144*    Basic Metabolic Panel:  Recent Labs Lab 10/10/16 1107 10/10/16 1131  NA 145 141  K 4.2 3.7  CL 108 106  CO2  --  27  GLUCOSE 308* 291*  BUN 40* 31*  CREATININE 1.50* 1.36*  CALCIUM  --  10.1  Lipid Panel:    Component Value Date/Time   CHOL 131 10/11/2016 0040   TRIG 86 10/11/2016 0040   HDL 52 10/11/2016 0040   CHOLHDL 2.5 10/11/2016 0040   VLDL 17 10/11/2016 0040   LDLCALC 62 10/11/2016 0040   HgbA1c:  Lab Results  Component Value Date   HGBA1C 7.1 (H) 07/04/2016   Urine Drug Screen:    Component Value Date/Time   LABOPIA NONE DETECTED 10/10/2016 1055   COCAINSCRNUR NONE DETECTED 10/10/2016 1055   LABBENZ NONE DETECTED 10/10/2016 1055   AMPHETMU NONE DETECTED 10/10/2016 1055   THCU NONE DETECTED 10/10/2016 1055   LABBARB NONE DETECTED 10/10/2016 1055      IMAGING I have personally reviewed  the radiological images below and agree with the radiology interpretations.  Ct Head Wo Contrast 10/10/2016 Age-indeterminate right temporoparietal infarct, new since 2009 exam, likely subacute or chronic in age. Consider brain MRI without and with contrast for further evaluation. Diffuse cerebral and cerebellar atrophy, chronic small vessel disease, and old bilateral cerebellar and high right parietal infarcts. Electronically Signed   By: Earle Gell M.D.   On: 10/10/2016 12:09   Ct Angio Head W Or Wo Contrast Ct Angio Neck W Or Wo Contrast Ct Cerebral Perfusion W Contrast 10/10/2016  1. Small acute left ACA territory infarct. Surrounding penumbra involving bilateral ACA territories as above. 2. Positive CTA for emergent large vessel occlusion involving probable proximal azygos ACA, with distal reconstitution of bilateral A2 segments distally. Distal flow is irregular and attenuated, particularly on the right. 3. Extensive atheromatous disease throughout the carotid siphons with moderate diffuse narrowing. 4. Extensive atheromatous disease involving the posterior circulation, with multifocal moderate to severe left V4 stenoses, with additional moderate stenosis of the mid basilar artery. 5. Mild atheromatous disease involving the carotid arteries within the neck, with no flow-limiting stenosis identified. 6. Moderate stenosis at the origin of the vertebral arteries bilaterally.   Mr Brain Wo Contrast 10/10/2016 Patchy 2 cm area of right MCA territory ischemia in the posterior right temporal lobe with no hemorrhage or mass effect. Possible superimposed acute cortical lacune in the right parietal lobe. 2. No other acute ischemia identified, specifically no convincing left side diffusion abnormality. 3. Underlying advanced chronic ischemic disease otherwise appears not significantly changed since 2007.   Mr Jodene Nam Head Wo Contrast 10/10/2016 Intracranial MRA is significantly degraded by motion artifact. No  strong evidence of emergent large vessel occlusion.   Dg Chest Port 1 View 10/10/2016 1. Small left pleural effusion.   2-D echocardiogram Procedure narrative: Transthoracic echocardiography. Image   quality was adequate. The study was technically difficult, as a   result of poor sound wave transmission. Intravenous contrast   (Definity) was administered. - Left ventricle: The cavity size was normal. There was moderate   concentric and severe asymmetric hypertrophy of the septum.   Systolic function was normal. The estimated ejection fraction was   in the range of 60% to 65%. There was dynamic obstruction at   restin the mid cavity, with a peak gradient of 11 mm Hg. Wall   motion was normal; there were no regional wall motion   abnormalities. Features are consistent with a pseudonormal left   ventricular filling pattern, with concomitant abnormal relaxation   and increased filling pressure (grade 2 diastolic dysfunction). - Mitral valve: Calcified annulus. - Left atrium: The atrium was mildly dilated. - Pulmonary arteries: Systolic pressure could not be accurately   estimated.   PHYSICAL EXAM  Temp:  [  97.6 F (36.4 C)-99.6 F (37.6 C)] 99.5 F (37.5 C) (02/02 2009) Pulse Rate:  [82-140] 94 (02/02 2115) Resp:  [14-32] 27 (02/02 2115) BP: (109-187)/(47-99) 109/47 (02/02 2041) SpO2:  [88 %-99 %] 99 % (02/02 2115) Weight:  [142 lb 4.8 oz (64.5 kg)] 142 lb 4.8 oz (64.5 kg) (02/02 0329)  General - mourning throughout the encounter, nonverbal and not following commands.  Ophthalmologic - Fundi not visualized due to noncooperative.  Cardiovascular - Regular rate and rhythm.  Neuro - eyes open, not tracking, mourning throughout the encounter, not blinking to visual threat, nonverbal and not following any commands. PERRL, not moving eyes on command, facial grossly symmetrical, tongue exam not cooperative. On pain stimulation, withdraw slightly in BUEs, and no movement on BLEs. DTR 2+  and positive babinski bilaterally.    ASSESSMENT/PLAN Nancy [name] is a 83 y.o. female with history of diabetes mellitus, hypertension,  and coronary artery disease , admitted to [facility] with bilateral lower extremity weakness and aphasia with known right parietal infarct. Nonocclusive thrombus was found in M1. Transferred to [facility] for possible intervention. She did not receive IV t-PA due to being out of the window. IR was deferred secondary to modified Rankin scale of 3 at baseline and overall declining medical condition  Stroke:  Right MCA territory small infarcts and b/l ACA infarcts due to azygous ACA occlusions, heroic measures not pursued. Infarcts felt to be embolic secondary to unknown source  Resultant - nonverbal, bedbound  MRI  right MCA territory infarct (temporal lobe) and b/l ACA infarcts  MRA  no emergent large vessel occlusion  CTA head, neck, perfusion Positive emergent large vessel occlusion involving proximal azygous ACA . Extensive atheromatous carotid siphons, carotid arteries and bilateral vertebral arteries. posterior circulation with severe disease L VAs and mid basilar.  2D Echo  EF 60-65%  LDL 62  HgbA1c pending, 7.1 in October  Lovenox 30 mg sq daily for VTE prophylaxis  Diet NPO time specified  aspirin 81 mg daily prior to admission, now on aspirin 300 mg suppository daily. Recommend palliative care consult.   Therapy recommendations:  SNF  Disposition:  pending (at baseline wheelchair-bound with declining status)  I have called POA niece twice for palliative care decision, but her phone went to VM every time but not able to leave VM as VM box was full. Stroke team will contact her in am.   Hypertension  Variable, Stable currently  Permissive hypertension (OK if < 220/120) but gradually normalize in 5-7 days  Long-term BP goal normotensive  Hyperlipidemia  Home meds:  Zocor 40  Changed to Lipitor 20 mg in hospital  LDL 62,  goal < 70  Continue statin at discharge  Diabetes type II  HgbA1c 7.1 in October  HbA1C pending, goal < 7.0  Uncontrolled  Hyperglycemia  SSI  Other Stroke Risk Factors  Advanced age  Coronary artery disease status post CABG  Other Active Problems  Baseline dementia on [place] - bed/wheelchair bound, near nonverbal in NH  Chronic kidney disease stage III  Elevated troponin  History colon cancer  Chronic lymphocytic leukemia  Hospital day # 1  Due to patient advance dementia and limited activity as well non verbal, recommend palliative care. Not able to contact POA niece today.   Rosalin Hawking, MD PhD Stroke Neurology 10/11/2016 11:55 PM  To contact Stroke Continuity provider, please refer to [email or web address] After hours, contact General Neurology

## 2016-10-11 NOTE — Evaluation (Signed)
Occupational Therapy Evaluation Patient Details Name: Nancy [name] MRN: [id number] DOB: [birth year] Today's Date: 10/11/2016    History of Present Illness pt presents with R Parietal Infarct.  pt with hx of CAD, Colon CA, Depression, DM and HTN.     Clinical Impression   Per family pt was participating in ADL with assist from nursing home staff PTA. Currently pt total assist for ADL and functional mobility. Pt presenting with impaired communication, L head turn and gaze preference, does not follow commands, bil UE weakness/decreased ROM/impaired sensation (R worse than L) impacting her independence and safety with ADL and functional mobility. Recommend return to SNF upon d/c. Pt would benefit from continued skilled OT to address established goals.    Follow Up Recommendations  SNF;Supervision/Assistance - 24 hour    Equipment Recommendations  Other (comment) (TBD at next venue)    Recommendations for Other Services       Precautions / Restrictions Precautions Precautions: Fall Restrictions Weight Bearing Restrictions: No      Mobility Bed Mobility                  Transfers                      Balance                                            ADL Overall ADL's : Needs assistance/impaired                                       General ADL Comments: Pt currently total assist for ADL. HR unsustained in 140s.     Vision Additional Comments: Difficult to assess due to impaired cognition/communication. Pt with head turn toward L side and gaze preference to L. Will occasionally visually cross midline with verbal simulus but inconsistent.   Perception     Praxis      Pertinent Vitals/Pain Pain Assessment: Faces Faces Pain Scale: No hurt     Hand Dominance Right   Extremity/Trunk Assessment Upper Extremity Assessment Upper Extremity Assessment: RUE deficits/detail;LUE deficits/detail RUE Deficits /  Details: No active movement noted. No withdrawl to noxious stimuli. RUE Sensation: decreased light touch RUE Coordination: decreased fine motor;decreased gross motor LUE Deficits / Details: Pt without purposeful movement but does demonstrate tricep activation with resistance. Does not withdraw to painful stimuli. LUE Sensation: decreased light touch LUE Coordination: decreased fine motor;decreased gross motor   Lower Extremity Assessment Lower Extremity Assessment: Defer to PT evaluation       Communication Communication Communication: Other (comment) (non verbal on eval)   Cognition Arousal/Alertness: Awake/alert Behavior During Therapy: Flat affect;Restless Overall Cognitive Status: Difficult to assess                 General Comments: Pt non verbal and not responding to commands. Pt with repetitive chewing motion throughout.   General Comments       Exercises       Shoulder Instructions      Home Living Family/patient expects to be discharged to:: Skilled nursing facility                                 Additional  Comments: Pt from [facility] home.      Prior Functioning/Environment Level of Independence: Needs assistance  Gait / Transfers Assistance Needed: With assist pt transfers to w/c. Able to propell self around facility at w/c level. Occasionally transfers without assist to toilet. ADL's / Homemaking Assistance Needed: Pt gets in shower with assist of staff, able to participate in ADL with assist. Self feeds independently.            OT Problem List: Decreased strength;Decreased range of motion;Decreased activity tolerance;Impaired balance (sitting and/or standing);Impaired vision/perception;Decreased coordination;Decreased cognition;Decreased safety awareness;Decreased knowledge of use of DME or AE;Decreased knowledge of precautions;Cardiopulmonary status limiting activity;Impaired sensation;Impaired tone;Impaired UE functional  use   OT Treatment/Interventions: Self-care/ADL training;Therapeutic exercise;Neuromuscular education;Energy conservation;DME and/or AE instruction;Therapeutic activities;Cognitive remediation/compensation;Visual/perceptual remediation/compensation;Patient/family education;Balance training    OT Goals(Current goals can be found in the care plan section) Acute Rehab OT Goals Patient Stated Goal: pt non verbal OT Goal Formulation: With patient/family Time For Goal Achievement: 10/25/16 Potential to Achieve Goals: Fair ADL Goals Additional ADL Goal #1: Pt will attend to stimulus in R visual field 50% of the time with mod cues. Additional ADL Goal #2: Pt will sit EOB 10 minutes with min assist as precursor to ADL. Additional ADL Goal #3: Pt will follow one step command 50% of the time in a non distracting environment.  OT Frequency: Min 2X/week   Barriers to D/C:            Co-evaluation              End of Session Nurse Communication: Mobility status  Activity Tolerance: Patient tolerated treatment well Patient left: in bed;with call bell/phone within reach;with bed alarm set;with family/visitor present   Time: 1422-1445 OT Time Calculation (min): 23 min Charges:  OT General Charges $OT Visit: 1 Procedure OT Evaluation $OT Eval Moderate Complexity: 1 Procedure OT Treatments $Therapeutic Activity: 8-22 mins G-Codes:     Binnie Kand M.S., OTR/L Pager: [phone]  10/11/2016, 3:06 PM

## 2016-10-11 NOTE — Evaluation (Signed)
Physical Therapy Evaluation Patient Details Name: Nancy [name] MRN: [id number] DOB: 05-Mar-1925 Today's Date: 10/11/2016   History of Present Illness  pt presents with R Parietal Infarct.  pt with hx of CAD, Colon CA, Depression, DM and HTN.    Clinical Impression  Pt very flat and maintains gaze to R side, though when assessing pain sensation pt's gaze did come to midline when assessing L side.  No verbalizations throughout session and did not follow any directions.  No active movement and no participation with mobility.  Question if pt would benefit from Palliative Consult for a [place] conversation with family.  Will continue to follow.      Follow Up Recommendations SNF    Equipment Recommendations  None recommended by PT    Recommendations for Other Services       Precautions / Restrictions Precautions Precautions: Fall Restrictions Weight Bearing Restrictions: No      Mobility  Bed Mobility Overal bed mobility: Needs Assistance Bed Mobility: Supine to Sit;Sit to Supine     Supine to sit: Total assist;+2 for physical assistance;HOB elevated Sit to supine: Total assist;+2 for physical assistance   General bed mobility comments: No participation from pt.    Transfers                    Ambulation/Gait                Stairs            Wheelchair Mobility    Modified Rankin (Stroke Patients Only)       Balance Overall balance assessment: Needs assistance Sitting-balance support: Feet supported Sitting balance-Leahy Scale: Zero Sitting balance - Comments: No participation with maintaining balance.  pt remains very flexed and hangs her head.  Attempted to have pt hold her head up, but no participation.                                       Pertinent Vitals/Pain Pain Assessment: Faces Faces Pain Scale: No hurt    Home Living Family/patient expects to be discharged to:: Skilled nursing facility   Available Help at  Discharge: Family;Available 24 hours/day Type of Home: Other(Comment)           Additional Comments: No family present for eval and unclear level of A family is able to provide.      Prior Function Level of Independence: Needs assistance   Gait / Transfers Assistance Needed: Per chart pt uses W/C primarily for mobility and indicates pt stays in bed mostly.  Unclear level of A needed for OOB.             Hand Dominance   Dominant Hand:  (unable to confirm)    Extremity/Trunk Assessment   Upper Extremity Assessment Upper Extremity Assessment: Defer to OT evaluation    Lower Extremity Assessment Lower Extremity Assessment: RLE deficits/detail;LLE deficits/detail;Difficult to assess due to impaired cognition RLE Deficits / Details: Pain sensation intact, but delayed response.  No active movement noted, no increase in tone.   RLE Coordination: decreased fine motor;decreased gross motor LLE Deficits / Details: Pain sensation intact,but delayed.  No active movement noted, no increased tone.   LLE Coordination: decreased fine motor;decreased gross motor    Cervical / Trunk Assessment Cervical / Trunk Assessment: Kyphotic  Communication   Communication:  (No verbalizations and not following directions)  Cognition Arousal/Alertness: Lethargic  Behavior During Therapy: Flat affect Overall Cognitive Status: Difficult to assess                 General Comments: pt non verabl and not following any directions.  pt does not visually attend to sound, but remains with fixed gaze to pt's R.      General Comments      Exercises     Assessment/Plan    PT Assessment Patient needs continued PT services  PT Problem List Decreased strength;Decreased activity tolerance;Decreased balance;Decreased mobility;Decreased coordination;Decreased cognition;Decreased knowledge of use of DME;Decreased safety awareness          PT Treatment Interventions DME instruction;Functional  mobility training;Therapeutic activities;Therapeutic exercise;Balance training;Neuromuscular re-education;Cognitive remediation;Patient/family education    PT Goals (Current goals can be found in the Care Plan section)  Acute Rehab PT Goals Patient Stated Goal: pt non verbal PT Goal Formulation: Patient unable to participate in goal setting Time For Goal Achievement: 10/25/16 Potential to Achieve Goals: Fair    Frequency Min 2X/week   Barriers to discharge        Co-evaluation               End of Session   Activity Tolerance: Patient limited by lethargy Patient left: in bed;with call bell/phone within reach;with bed alarm set Nurse Communication: Mobility status         Time: [id number] PT Time Calculation (min) (ACUTE ONLY): 12 min   Charges:   PT Evaluation $PT Eval Moderate Complexity: 1 Procedure     PT G CodesThornton Papas Arlo Butt, PT  [phone] 10/11/2016, 11:26 AM

## 2016-10-11 NOTE — Progress Notes (Addendum)
Inpatient Diabetes Program Recommendations  AACE/ADA: New Consensus Statement on Inpatient Glycemic Control (2015)  Target Ranges:  Prepandial:   less than 140 mg/dL      Peak postprandial:   less than 180 mg/dL (1-2 hours)      Critically ill patients:  140 - 180 mg/dL   Lab Results  Component Value Date   GLUCAP 155 (H) 10/11/2016   HGBA1C 7.1 (H) 07/04/2016   Results for Ravan, INDA NEWBROUGH (MRN [id number]) as of 10/11/2016 15:27  Ref. Range 10/10/2016 10:40 10/10/2016 16:18 10/10/2016 21:12 10/11/2016 00:12 10/11/2016 03:32 10/11/2016 07:46 10/11/2016 11:16  Glucose-Capillary Latest Ref Range: 65 - 99 mg/dL 259 (H) 398 (H) 477 (H) 440 (H) 371 (H) 254 (H) 155 (H)   Review of Glycemic Control  Diabetes history:     DM2, elderly Outpatient Diabetes medications:     Januvia 25 mg daily,     Amaryl 1 mg daily Current orders for Inpatient glycemic control:     Sensitive correction scale Novolog 0-9 units Q4H  Inpatient Diabetes Program Recommendations:    Please consider Levemir 6 units BID.   Thank you,  Windy Carina, RN, MSN Diabetes Coordinator Inpatient Diabetes Program [phone] (Team Pager)

## 2016-10-11 NOTE — Progress Notes (Signed)
Paged on call NP about CBG of 477. Order for 12 Units Novolog x1, q4 CBG and sensitive scale given.

## 2016-10-12 ENCOUNTER — Inpatient Hospital Stay (HOSPITAL_COMMUNITY): Payer: Medicare Other

## 2016-10-12 DIAGNOSIS — I639 Cerebral infarction, unspecified: Secondary | ICD-10-CM

## 2016-10-12 LAB — BASIC METABOLIC PANEL
Anion gap: 11 (ref 5–15)
BUN: 51 mg/dL — AB (ref 6–20)
CHLORIDE: 112 mmol/L — AB (ref 101–111)
CO2: 21 mmol/L — AB (ref 22–32)
CREATININE: 2.56 mg/dL — AB (ref 0.44–1.00)
Calcium: 10.1 mg/dL (ref 8.9–10.3)
GFR calc Af Amer: 18 mL/min — ABNORMAL LOW (ref >60)
GFR calc non Af Amer: 16 mL/min — ABNORMAL LOW (ref >60)
GLUCOSE: 206 mg/dL — AB (ref 65–99)
Potassium: 5.5 mmol/L — ABNORMAL HIGH (ref 3.5–5.1)
SODIUM: 144 mmol/L (ref 135–145)

## 2016-10-12 LAB — CBC
HEMATOCRIT: 38.7 % (ref 36.0–46.0)
Hemoglobin: 12.3 g/dL (ref 12.0–15.0)
MCH: 27.5 pg (ref 26.0–34.0)
MCHC: 31.8 g/dL (ref 30.0–36.0)
MCV: 86.6 fL (ref 78.0–100.0)
Platelets: 144 10*3/uL — ABNORMAL LOW (ref 150–400)
RBC: 4.47 MIL/uL (ref 3.87–5.11)
RDW: 15.5 % (ref 11.5–15.5)
WBC: 15 10*3/uL — ABNORMAL HIGH (ref 4.0–10.5)

## 2016-10-12 LAB — GLUCOSE, CAPILLARY
GLUCOSE-CAPILLARY: 182 mg/dL — AB (ref 65–99)
GLUCOSE-CAPILLARY: 182 mg/dL — AB (ref 65–99)
Glucose-Capillary: 191 mg/dL — ABNORMAL HIGH (ref 65–99)
Glucose-Capillary: 183 mg/dL — ABNORMAL HIGH (ref 65–99)
Glucose-Capillary: 208 mg/dL — ABNORMAL HIGH (ref 65–99)
Glucose-Capillary: 182 mg/dL — ABNORMAL HIGH (ref 65–99)

## 2016-10-12 LAB — HEMOGLOBIN A1C
HEMOGLOBIN A1C: 7.4 % — AB (ref 4.8–5.6)
Mean Plasma Glucose: 166 mg/dL

## 2016-10-12 MED ORDER — ORAL CARE MOUTH RINSE
15.0000 mL | Freq: Two times a day (BID) | OROMUCOSAL | Status: DC
  Administered 2016-10-12 – 2016-10-15 (×8): 15 mL via OROMUCOSAL

## 2016-10-12 MED ORDER — METOPROLOL TARTRATE 5 MG/5ML IV SOLN: 5.0000 mg | Freq: Four times a day (QID) | INTRAVENOUS | Status: DC | PRN

## 2016-10-12 MED ORDER — INSULIN DETEMIR 100 UNIT/ML ~~LOC~~ SOLN
6.0000 [IU] | SUBCUTANEOUS | Status: DC
  Administered 2016-10-12 – 2016-10-13 (×2): 6 [IU] via SUBCUTANEOUS
  Filled 2016-10-12 (×4): qty 0.06

## 2016-10-12 MED ORDER — CHLORHEXIDINE GLUCONATE 0.12 % MT SOLN
15.0000 mL | Freq: Two times a day (BID) | OROMUCOSAL | Status: DC
  Administered 2016-10-12 – 2016-10-15 (×8): 15 mL via OROMUCOSAL
  Filled 2016-10-12 (×7): qty 15

## 2016-10-12 NOTE — Progress Notes (Addendum)
Patient ID: Nancy [name], female   DOB: Dec 17, 1924, 83 y.o.   MRN: [id number]    PROGRESS NOTE  Nancy [name]  [id number] DOB: Jul 10, 1925 DOA: 10/10/2016  PCP: Virgie Dad, MD   Brief HPI: 83 y.o. female with medical history of diabetes mellitus, hypertension,  and coronary artery disease presented from [place] with onset of aphasia. Apparently, the patient ate breakfast and was conversant at the time on the morning of 10/10/2016. After breakfast, the nursing staff checked back on the patient, and they noted that the patient was aphasic.   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 posterior circulation with severe disease L VAs and mid basilar. - 2D Echo  EF 60-65% - LDL 62 - HgbA1c 7.4, 7.1 in October - now on aspirin 300 mg suppository daily - after [place] discussions by neurology team, PCT consulted   Elevated troponins - likely demand ischemia/NSTEMI in the setting of acute stroke - pt non verbal but per POA, DNR status confirmed - no aggressive interventions, conservative management for now - palliative care team consulted   Hyperlipidemia - Home meds:  Zocor 40 - Changed to Lipitor 20 mg in hospital - LDL 62, goal < 70 - Continue statin at discharge if pt able to take PO but for now, pt still NPO  Hypertension, essential  - Allow for permissive hypertension - Hydralazine when necessary SBP >220 or DBP >120 - Hold lisinopril  Leukocytosis - unclear etiology - also with low grade fevers 99.9 F - will ask for CXR to make sure no impeding infection  - CBC In  AM  Acute on CKD Stage 3, hyperkalemia  - Baseline creatinine 1.2-1.4 - now up, no specific nephrotoxic medication on board - will ask for bladder scan to make sure no retention, renal US ordered to make sure no hydronephrosis  - BMP in AM  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   Objective: Vitals:   10/12/16 0019 10/12/16 0400 10/12/16 0727 10/12/16 1105  BP: 109/76 123/60 (!) 107/55 (!) 159/73  Pulse: 99 81 77 91  Resp: (!) 21 (!) 21 (!) 23 18  Temp: 99.1 F (37.3 C) 99.9 F (37.7 C) 98.9 F (37.2 C) 99 F (37.2 C)  TempSrc: Axillary Axillary Oral Axillary  SpO2: 100% 96% 97% 96%  Weight:  63.6 kg (140 lb 4.8 oz)    Height:        Intake/Output Summary (Last 24 hours) at 10/12/16 1507 Last data filed at 10/12/16 0647  Gross per 24 hour  Intake  218.33 ml  Output                0 ml  Net           218.33 ml   Filed Weights   10/10/16 2040 10/11/16 0329 10/12/16 0400  Weight: 64.5 kg (142 lb 1.6 oz) 64.5 kg (142 lb 4.8 oz) 63.6 kg (140 lb 4.8 oz)    Examination:  General exam: Appears calm and comfortable, non verbal, moaning  Respiratory system: Respiratory effort normal. Diminished breath sounds at bases  Cardiovascular system: S1 & S2 heard, RRR. No JVD, murmurs, rubs, gallops or clicks.   Data Reviewed: I have personally reviewed following labs and imaging  studies  CBC:  Recent Labs Lab 10/10/16 1107 10/10/16 1131 10/12/16 0524  WBC  --  6.6 15.0*  NEUTROABS  --  3.0  --   HGB 13.3 12.5 12.3  HCT 39.0 38.9 38.7  MCV  --  88.6 86.6  PLT  --  144* [id number]*   Basic Metabolic Panel:  Recent Labs Lab 10/10/16 1107 10/10/16 1131 10/12/16 0524  NA 145 141 144  K 4.2 3.7 5.5*  CL 108 106 112*  CO2  --  27 21*  GLUCOSE 308* 291* 206*  BUN 40* 31* 51*  CREATININE 1.50* 1.36* 2.56*  CALCIUM  --  10.1 10.1   Liver Function Tests:  Recent Labs Lab 10/10/16 1131  AST 27  ALT 27  ALKPHOS 94  BILITOT 0.5  PROT 7.8  ALBUMIN 3.8    Recent Labs Lab 10/10/16 1206  AMMONIA 22   Coagulation Profile:  Recent Labs Lab 10/10/16 1229  INR 1.04   Cardiac Enzymes:  Recent Labs Lab 10/10/16 1607 10/11/16 0040 10/11/16 1330  TROPONINI 0.10* 0.31* 0.36*   CBG:  Recent Labs Lab 10/11/16 2011 10/12/16 0018 10/12/16 0410 10/12/16 0718 10/12/16 1128  GLUCAP 249* 182* 191* 183* 208*   Lipid Profile:  Recent Labs  10/11/16 0040  CHOL 131  HDL 52  LDLCALC 62  TRIG 86  CHOLHDL 2.5   Thyroid Function Tests:  Recent Labs  10/11/16 0040  TSH 1.703   Urine analysis:    Component Value Date/Time   COLORURINE STRAW (A) 10/10/2016 1055   APPEARANCEUR CLEAR 10/10/2016 1055   LABSPEC 1.014 10/10/2016 1055   PHURINE 7.0 10/10/2016 1055   GLUCOSEU >=500 (A) 10/10/2016 1055   HGBUR NEGATIVE 10/10/2016 1055   BILIRUBINUR NEGATIVE 10/10/2016 1055   KETONESUR NEGATIVE 10/10/2016 1055   PROTEINUR 100 (A) 10/10/2016 1055   UROBILINOGEN 0.2 08/07/2008 0025   NITRITE NEGATIVE 10/10/2016 1055   LEUKOCYTESUR NEGATIVE 10/10/2016 1055   Recent Results (from the past 240 hour(s))  Blood culture (routine x 2)     Status: None (Preliminary result)   Collection Time: 10/10/16 11:35 AM  Result Value Ref Range Status   Specimen Description BLOOD RIGHT HAND  Final   Special Requests BOTTLES DRAWN AEROBIC ONLY 6cc  Final    Culture NO GROWTH < 24 HOURS  Final   Report Status PENDING  Incomplete  Blood culture (routine x 2)     Status: None (Preliminary result)   Collection Time: 10/10/16 12:06 PM  Result Value Ref Range Status   Specimen Description BLOOD LEFT ARM  Final   Special Requests BOTTLES DRAWN AEROBIC AND ANAEROBIC 6CC  Final   Culture NO GROWTH < 24 HOURS  Final   Report Status PENDING  Incomplete  MRSA PCR Screening     Status:  None   Collection Time: 10/10/16  8:46 PM  Result Value Ref Range Status   MRSA by PCR NEGATIVE NEGATIVE Final    Radiology Studies: Ct Angio Head W Or Wo Contrast  Result Date: 10/10/2016 CLINICAL DATA:  Initial evaluation for acute aphasia. EXAM: CT ANGIOGRAPHY HEAD AND NECK TECHNIQUE: Multidetector CT imaging of the head and neck was performed using the standard protocol during bolus administration of intravenous contrast. Multiplanar CT image reconstructions and MIPs were obtained to evaluate the vascular anatomy. Carotid stenosis measurements (when applicable) are obtained utilizing NASCET criteria, using the distal internal carotid diameter as the denominator. CONTRAST:  100 cc of Isovue 370.  Prior COMPARISON:  Comparison made with prior noncontrast head CT and MRI/MRA from earlier the same day. FINDINGS: CTA NECK FINDINGS Aortic arch: Visualized aortic arch of normal caliber with normal 3 vessel morphology. Scattered atheromatous plaque within the arch and about the origin of the great vessels without flow-limiting stenosis. Mild atheromatous narrowing at the proximal left subclavian artery of approximately 25% noted. Visualized subclavian arteries otherwise widely patent. Right carotid system: Right common carotid artery tortuous proximally. Mild scattered plaque within the distal common carotid artery without flow-limiting stenosis. Minimal plaque about the right bifurcation without significant narrowing. Right ICA mildly tortuous but widely patent distally to the skullbase  without stenosis, dissection, or occlusion. Left carotid system: Left common carotid artery tortuous proximally. Mild scattered plaque within the distal left common carotid artery without significant stenosis. Left ICA patent distally and without stenosis, dissection, or occlusion. Vertebral arteries: Both of the vertebral arteries arise from the subclavian arteries. Focal plaque at multifocal mild to moderate narrowing within the left V2 segment noted. No high-grade flow-limiting stenosis within the vertebral arteries. No evidence for vascular occlusion or occlusion. Skeleton: No acute osseous abnormality. Moderate degenerative spondylolysis noted at C6-7. Visualized osseous structures demonstrate a somewhat mottled appearance without focal worrisome lytic or blastic osseous lesion. Other neck: Visualized soft tissues of the neck demonstrate no acute abnormality. No adenopathy. Thyroid within normal limits. Upper chest: Visualized upper mediastinum within normal limits. Visualized lungs are clear. Review of the MIP images confirms the above findings CTA HEAD FINDINGS Anterior circulation: Mild scattered plaque within the horizontal petrous segments bilaterally without flow-limiting stenosis. Extensive smooth atheromatous plaque throughout the cavernous/ supraclinoid segments with moderate diffuse narrowing. Right A1 segment dominant and widely patent. There is a small hypoplastic left A1 segment, grossly patent. Anterior communicating artery grossly normal. Abrupt occlusion of proximal left A2 segment (series 502, image 82). Irregularity distal reconstitution with patchy flow seen distally (series 502, image 73). Additionally, there is apparent occlusion of the proximal right A2 segment at the same level (series 502, image 75). Distal reconstitution with attenuated thready flow distally. Short azygos ACA could be considered. No M1 occlusion identified. Moderate stenosis distal right M1 segment. Moderate narrowing  mid left M1 segment. The MCA bifurcations within normal limits. Distal MCA branches demonstrate multifocal atheromatous irregularity but are well perfused and fairly symmetric. Posterior circulation: Right V4 segment mildly irregular but patent to the vertebrobasilar junction without high-grade stenosis. Multifocal moderate to severe left V4 stenoses present, most notable just prior to the vertebrobasilar junction. Moderate to advanced narrowing at the vertebrobasilar junction itself. Posterior inferior cerebral arteries patent bilaterally. Basilar artery demonstrates multifocal atheromatous irregularity. Superimposed moderate stenosis of approximately 50% within the mid basilar artery (series 503, image 107). Superior cerebral arteries patent bilaterally. Right PCA mildly irregular but widely patent to its distal aspect. Left P1  segment somewhat hypoplastic. Left PCA patent distally without high-grade stenosis. Venous sinuses: Patent. Anatomic variants: No significant anatomic variant. No aneurysm or vascular malformation. Delayed phase: Not performed. Review of the MIP images confirms the above findings CT Brain Perfusion Findings: CBF (<30%) Volume: 60mL Perfusion (Tmax>6.0s) volume: 34mL Mismatch Volume: 33mL Infarction Location:Apparent infarct is located within the parasagittal left frontal lobe, left ACA territory. Surrounding penumbra within the adjacent left ACA territory with additional ischemic penumbra within the right ACA territory. Perfusion abnormality consistent with azygos ACA occlusion seen on prior corresponding CTA. IMPRESSION: 1. Small acute left ACA territory infarct. Surrounding penumbra involving bilateral ACA territories as above. 2. Positive CTA for emergent large vessel occlusion involving probable proximal azygos ACA, with distal reconstitution of bilateral A2 segments distally. Distal flow is irregular and attenuated, particularly on the right. 3. Extensive atheromatous disease throughout  the carotid siphons with moderate diffuse narrowing. 4. Extensive atheromatous disease involving the posterior circulation, with multifocal moderate to severe left V4 stenoses, with additional moderate stenosis of the mid basilar artery. 5. Mild atheromatous disease involving the carotid arteries within the neck, with no flow-limiting stenosis identified. 6. Moderate stenosis at the origin of the vertebral arteries bilaterally. Critical Value/emergent results were called by telephone at the time of interpretation on 10/10/2016 at 5:27 pm to Dr. Roland Rack , who verbally acknowledged these results. Electronically Signed   By: Jeannine Boga M.D.   On: 10/10/2016 18:10   Ct Angio Neck W Or Wo Contrast  Result Date: 10/10/2016 CLINICAL DATA:  Initial evaluation for acute aphasia. EXAM: CT ANGIOGRAPHY HEAD AND NECK TECHNIQUE: Multidetector CT imaging of the head and neck was performed using the standard protocol during bolus administration of intravenous contrast. Multiplanar CT image reconstructions and MIPs were obtained to evaluate the vascular anatomy. Carotid stenosis measurements (when applicable) are obtained utilizing NASCET criteria, using the distal internal carotid diameter as the denominator. CONTRAST:  100 cc of Isovue 370.  Prior COMPARISON:  Comparison made with prior noncontrast head CT and MRI/MRA from earlier the same day. FINDINGS: CTA NECK FINDINGS Aortic arch: Visualized aortic arch of normal caliber with normal 3 vessel morphology. Scattered atheromatous plaque within the arch and about the origin of the great vessels without flow-limiting stenosis. Mild atheromatous narrowing at the proximal left subclavian artery of approximately 25% noted. Visualized subclavian arteries otherwise widely patent. Right carotid system: Right common carotid artery tortuous proximally. Mild scattered plaque within the distal common carotid artery without flow-limiting stenosis. Minimal plaque about  the right bifurcation without significant narrowing. Right ICA mildly tortuous but widely patent distally to the skullbase without stenosis, dissection, or occlusion. Left carotid system: Left common carotid artery tortuous proximally. Mild scattered plaque within the distal left common carotid artery without significant stenosis. Left ICA patent distally and without stenosis, dissection, or occlusion. Vertebral arteries: Both of the vertebral arteries arise from the subclavian arteries. Focal plaque at multifocal mild to moderate narrowing within the left V2 segment noted. No high-grade flow-limiting stenosis within the vertebral arteries. No evidence for vascular occlusion or occlusion. Skeleton: No acute osseous abnormality. Moderate degenerative spondylolysis noted at C6-7. Visualized osseous structures demonstrate a somewhat mottled appearance without focal worrisome lytic or blastic osseous lesion. Other neck: Visualized soft tissues of the neck demonstrate no acute abnormality. No adenopathy. Thyroid within normal limits. Upper chest: Visualized upper mediastinum within normal limits. Visualized lungs are clear. Review of the MIP images confirms the above findings CTA HEAD FINDINGS Anterior circulation: Mild scattered plaque  within the horizontal petrous segments bilaterally without flow-limiting stenosis. Extensive smooth atheromatous plaque throughout the cavernous/ supraclinoid segments with moderate diffuse narrowing. Right A1 segment dominant and widely patent. There is a small hypoplastic left A1 segment, grossly patent. Anterior communicating artery grossly normal. Abrupt occlusion of proximal left A2 segment (series 502, image 82). Irregularity distal reconstitution with patchy flow seen distally (series 502, image 73). Additionally, there is apparent occlusion of the proximal right A2 segment at the same level (series 502, image 75). Distal reconstitution with attenuated thready flow distally. Short  azygos ACA could be considered. No M1 occlusion identified. Moderate stenosis distal right M1 segment. Moderate narrowing mid left M1 segment. The MCA bifurcations within normal limits. Distal MCA branches demonstrate multifocal atheromatous irregularity but are well perfused and fairly symmetric. Posterior circulation: Right V4 segment mildly irregular but patent to the vertebrobasilar junction without high-grade stenosis. Multifocal moderate to severe left V4 stenoses present, most notable just prior to the vertebrobasilar junction. Moderate to advanced narrowing at the vertebrobasilar junction itself. Posterior inferior cerebral arteries patent bilaterally. Basilar artery demonstrates multifocal atheromatous irregularity. Superimposed moderate stenosis of approximately 50% within the mid basilar artery (series 503, image 107). Superior cerebral arteries patent bilaterally. Right PCA mildly irregular but widely patent to its distal aspect. Left P1 segment somewhat hypoplastic. Left PCA patent distally without high-grade stenosis. Venous sinuses: Patent. Anatomic variants: No significant anatomic variant. No aneurysm or vascular malformation. Delayed phase: Not performed. Review of the MIP images confirms the above findings CT Brain Perfusion Findings: CBF (<30%) Volume: 25mL Perfusion (Tmax>6.0s) volume: 74mL Mismatch Volume: 58mL Infarction Location:Apparent infarct is located within the parasagittal left frontal lobe, left ACA territory. Surrounding penumbra within the adjacent left ACA territory with additional ischemic penumbra within the right ACA territory. Perfusion abnormality consistent with azygos ACA occlusion seen on prior corresponding CTA. IMPRESSION: 1. Small acute left ACA territory infarct. Surrounding penumbra involving bilateral ACA territories as above. 2. Positive CTA for emergent large vessel occlusion involving probable proximal azygos ACA, with distal reconstitution of bilateral A2 segments  distally. Distal flow is irregular and attenuated, particularly on the right. 3. Extensive atheromatous disease throughout the carotid siphons with moderate diffuse narrowing. 4. Extensive atheromatous disease involving the posterior circulation, with multifocal moderate to severe left V4 stenoses, with additional moderate stenosis of the mid basilar artery. 5. Mild atheromatous disease involving the carotid arteries within the neck, with no flow-limiting stenosis identified. 6. Moderate stenosis at the origin of the vertebral arteries bilaterally. Critical Value/emergent results were called by telephone at the time of interpretation on 10/10/2016 at 5:27 pm to Dr. Roland Rack , who verbally acknowledged these results. Electronically Signed   By: Jeannine Boga M.D.   On: 10/10/2016 18:10   Mr Virgel Paling [id number] Contrast  Result Date: 10/10/2016 CLINICAL DATA:  83 year old female with altered mental status and bilateral lower extremity weakness, but right arm weakness and aphasia suggesting a left hemisphere abnormality. Initial encounter. EXAM: MRA HEAD WITHOUT CONTRAST TECHNIQUE: Angiographic images of the Circle of Willis were obtained using MRA technique without intravenous contrast. COMPARISON:  Brain MRI from today reported separately. Intracranial MRA 08/06/2006. FINDINGS: Study is significantly degraded by motion artifact despite repeated imaging attempts. The basilar artery and ICA siphons are patent. The basilar tip and carotid termini are patent. The left A1 segment is non dominant while the right is dominant and appears stable. The ACAs distal to the anterior communicating artery are poorly evaluated. Both PCAs appear patent and stable  since 2007. Both MCA M1 segments are patent. The right MCA bifurcation and proximal M2 branches appear stable compared to 2007. The left MCA bifurcation also appears patent, and the proximal left M2 branches appear stable except for a questionable flow gap at the  origin of the dominant left posterior sylvian division as seen on series 109, image 18. However, the preserved distal flow signal in that vessel seems normal and I suspect this finding is artifact. IMPRESSION: Intracranial MRA is significantly degraded by motion artifact. No strong evidence of emergent large vessel occlusion. This was discussed by telephone with Dr. Roland Rack on 10/10/2016 at 1540 hours. We discussed how an intracranial CTA would be complementary so long as a better quality exam could be obtained. Electronically Signed   By: Genevie Ann M.D.   On: 10/10/2016 15:57   Mr Brain Wo Contrast  Result Date: 10/10/2016 CLINICAL DATA:  83 year old female with altered mental status and bilateral lower extremity weakness, but right arm weakness and aphasia suggesting a left hemisphere abnormality. Initial encounter. EXAM: MRI HEAD WITHOUT CONTRAST TECHNIQUE: Multiplanar, multiecho pulse sequences of the brain and surrounding structures were obtained without intravenous contrast. COMPARISON:  Head CT without contrast 1149 hours today. Brain MRI and intracranial MRA 08/06/2006. FINDINGS: Study is intermittently degraded by motion artifact despite repeated imaging attempts. Brain: Chronic bilateral cerebellar infarcts. Chronic Patchy and confluent bilateral cerebral white matter T2 and FLAIR hyperintensity, including bilateral deep white matter capsule involvement. There is a small area of chronic cortical encephalomalacia in the superior right parietal lobe. Chronic bilateral deep gray matter T2 heterogeneity appears not significantly changed since 2007. Patchy and confluent pontine T2 heterogeneity also appears not significantly changed. Patchy 2 cm area of restricted diffusion in the posterior right temporal lobe corresponding to the noncontrast CT finding earlier today. Associated T2 and FLAIR hyperintensity without hemorrhage or mass effect. There is also a questionable subcentimeter superimposed  posterior right parietal lobe area of restricted diffusion on series 5, image 39. T2 shine through in the bilateral superior frontal gyri white matter. No convincing left side restricted diffusion. No posterior fossa restricted diffusion. No ventriculomegaly. No midline shift, mass effect, or evidence of intracranial mass lesion. Normal basilar cisterns. Grossly negative pituitary and cervicomedullary junction. Vascular: Major intracranial vascular flow voids are not well evaluated but appear grossly stable since 2007. Skull and upper cervical spine: Grossly negative. Sinuses/Orbits: Grossly negative orbits soft tissues. Paranasal sinuses are clear. There are chronic bilateral mastoid effusions which have not significantly changed since 2007. Negative nasopharynx. Other: Negative scalp soft tissues. IMPRESSION: 1. Patchy 2 cm area of right MCA territory ischemia in the posterior right temporal lobe with no hemorrhage or mass effect. Possible superimposed acute cortical lacune in the right parietal lobe. 2. No other acute ischemia identified, specifically no convincing left side diffusion abnormality. 3. Underlying advanced chronic ischemic disease otherwise appears not significantly changed since 2007. 4. Study discussed by telephone with Dr. Roland Rack at 1540 hours. Electronically Signed   By: Genevie Ann M.D.   On: 10/10/2016 15:50   Ct Cerebral Perfusion W Contrast  Result Date: 10/10/2016 CLINICAL DATA:  Initial evaluation for acute aphasia. EXAM: CT ANGIOGRAPHY HEAD AND NECK TECHNIQUE: Multidetector CT imaging of the head and neck was performed using the standard protocol during bolus administration of intravenous contrast. Multiplanar CT image reconstructions and MIPs were obtained to evaluate the vascular anatomy. Carotid stenosis measurements (when applicable) are obtained utilizing NASCET criteria, using the distal internal carotid diameter as  the denominator. CONTRAST:  100 cc of Isovue 370.   Prior COMPARISON:  Comparison made with prior noncontrast head CT and MRI/MRA from earlier the same day. FINDINGS: CTA NECK FINDINGS Aortic arch: Visualized aortic arch of normal caliber with normal 3 vessel morphology. Scattered atheromatous plaque within the arch and about the origin of the great vessels without flow-limiting stenosis. Mild atheromatous narrowing at the proximal left subclavian artery of approximately 25% noted. Visualized subclavian arteries otherwise widely patent. Right carotid system: Right common carotid artery tortuous proximally. Mild scattered plaque within the distal common carotid artery without flow-limiting stenosis. Minimal plaque about the right bifurcation without significant narrowing. Right ICA mildly tortuous but widely patent distally to the skullbase without stenosis, dissection, or occlusion. Left carotid system: Left common carotid artery tortuous proximally. Mild scattered plaque within the distal left common carotid artery without significant stenosis. Left ICA patent distally and without stenosis, dissection, or occlusion. Vertebral arteries: Both of the vertebral arteries arise from the subclavian arteries. Focal plaque at multifocal mild to moderate narrowing within the left V2 segment noted. No high-grade flow-limiting stenosis within the vertebral arteries. No evidence for vascular occlusion or occlusion. Skeleton: No acute osseous abnormality. Moderate degenerative spondylolysis noted at C6-7. Visualized osseous structures demonstrate a somewhat mottled appearance without focal worrisome lytic or blastic osseous lesion. Other neck: Visualized soft tissues of the neck demonstrate no acute abnormality. No adenopathy. Thyroid within normal limits. Upper chest: Visualized upper mediastinum within normal limits. Visualized lungs are clear. Review of the MIP images confirms the above findings CTA HEAD FINDINGS Anterior circulation: Mild scattered plaque within the  horizontal petrous segments bilaterally without flow-limiting stenosis. Extensive smooth atheromatous plaque throughout the cavernous/ supraclinoid segments with moderate diffuse narrowing. Right A1 segment dominant and widely patent. There is a small hypoplastic left A1 segment, grossly patent. Anterior communicating artery grossly normal. Abrupt occlusion of proximal left A2 segment (series 502, image 82). Irregularity distal reconstitution with patchy flow seen distally (series 502, image 73). Additionally, there is apparent occlusion of the proximal right A2 segment at the same level (series 502, image 75). Distal reconstitution with attenuated thready flow distally. Short azygos ACA could be considered. No M1 occlusion identified. Moderate stenosis distal right M1 segment. Moderate narrowing mid left M1 segment. The MCA bifurcations within normal limits. Distal MCA branches demonstrate multifocal atheromatous irregularity but are well perfused and fairly symmetric. Posterior circulation: Right V4 segment mildly irregular but patent to the vertebrobasilar junction without high-grade stenosis. Multifocal moderate to severe left V4 stenoses present, most notable just prior to the vertebrobasilar junction. Moderate to advanced narrowing at the vertebrobasilar junction itself. Posterior inferior cerebral arteries patent bilaterally. Basilar artery demonstrates multifocal atheromatous irregularity. Superimposed moderate stenosis of approximately 50% within the mid basilar artery (series 503, image 107). Superior cerebral arteries patent bilaterally. Right PCA mildly irregular but widely patent to its distal aspect. Left P1 segment somewhat hypoplastic. Left PCA patent distally without high-grade stenosis. Venous sinuses: Patent. Anatomic variants: No significant anatomic variant. No aneurysm or vascular malformation. Delayed phase: Not performed. Review of the MIP images confirms the above findings CT Brain Perfusion  Findings: CBF (<30%) Volume: 55mL Perfusion (Tmax>6.0s) volume: 24mL Mismatch Volume: 25mL Infarction Location:Apparent infarct is located within the parasagittal left frontal lobe, left ACA territory. Surrounding penumbra within the adjacent left ACA territory with additional ischemic penumbra within the right ACA territory. Perfusion abnormality consistent with azygos ACA occlusion seen on prior corresponding CTA. IMPRESSION: 1. Small acute left ACA territory infarct.  Surrounding penumbra involving bilateral ACA territories as above. 2. Positive CTA for emergent large vessel occlusion involving probable proximal azygos ACA, with distal reconstitution of bilateral A2 segments distally. Distal flow is irregular and attenuated, particularly on the right. 3. Extensive atheromatous disease throughout the carotid siphons with moderate diffuse narrowing. 4. Extensive atheromatous disease involving the posterior circulation, with multifocal moderate to severe left V4 stenoses, with additional moderate stenosis of the mid basilar artery. 5. Mild atheromatous disease involving the carotid arteries within the neck, with no flow-limiting stenosis identified. 6. Moderate stenosis at the origin of the vertebral arteries bilaterally. Critical Value/emergent results were called by telephone at the time of interpretation on 10/10/2016 at 5:27 pm to Dr. Roland Rack , who verbally acknowledged these results. Electronically Signed   By: Jeannine Boga M.D.   On: 10/10/2016 18:10   Scheduled Meds: .  stroke: mapping our early stages of recovery book   Does not apply Once  . aspirin  300 mg Rectal Daily   Or  . aspirin  325 mg Oral Daily  . atorvastatin  20 mg Oral QHS  . chlorhexidine  15 mL Mouth Rinse BID  . donepezil  10 mg Oral Daily  . enoxaparin (LOVENOX) injection  30 mg Subcutaneous Q24H  . insulin aspart  0-9 Units Subcutaneous Q4H  . mouth rinse  15 mL Mouth Rinse q12n4p  . memantine  5 mg Oral BID    Continuous Infusions: . sodium chloride    . diltiazem (CARDIZEM) infusion 5 mg/hr (10/12/16 0919)     LOS: 2 days   Time spent: 20 minutes   Faye Ramsay, MD [facility] Pager [phone]  If 7PM-7AM, please contact night-coverage [email or web address] Password [email or web address] 10/12/2016, 3:07 PM

## 2016-10-12 NOTE — Progress Notes (Addendum)
STROKE TEAM PROGRESS NOTE   HISTORY OF PRESENT ILLNESS (per record) Nancy [name] is a 83 y.o. female who was last her normal state of health sometime yesterday ([place] 10/09/2016, time unknown), when she started having decreased verbal output. Today to 09/28/2016, there is concern for bilateral lower extremity weakness and aphasia and therefore the patient was taken [facility]. She is not IV TPA candidate given that she had last been seen normal the day before but due to the severity of her symptoms, the hospitalist called me to ask about possible further options. She was already in the MRI suite when I was called and therefore an MRA was performed. The MRI did demonstrate a moderate size right parietal infarct. Though no definite conclusion was present, I did feel that there is a possibility of a nonocclusive thrombus in the M1. Also the study was very poor quality. She was therefore transferred to [facility].  She was transferred down for CT perfusion/CT angiogram which was performed and did demonstrate ACA occlusion with some penumbra.  I had a long discussion with the niece who is the healthcare power of attorney. At baseline, the patient is predominantly wheelchair bound, able to walk only a little bit predominantly with assistance. He spends most days in bed, but occasionally does get up and have "good days." She does talk and interact, though this is been decreasing over time.  Given her baseline, and the fact that she has a definite parietal infarct which is likely going to worsen her baseline to some degree, I discussed with the niece about whether she would want to be aggressive in a situation like this and after discussion decided that we would not take her for any aggressive procedures. I also discussed CODE STATUS, and in the event of cardiac arrest, she would not want resuscitation, but would want full support up to the point of that. Premorbid modified rankin scale: 4. Patient was not  administered IV t-PA secondary to being out of the window. She was admitted  for further evaluation and treatment.   SUBJECTIVE (INTERVAL HISTORY) There are no family members at bedside today.  Notes regarding heart reviewed.  Patient's heart reate continues to be an issue and primary team is aware and treating.  Given advanced dementia and new ischemic disease, quality of life is significantly diminished.  Family may benefit from [place] for acute and long-term care plans   OBJECTIVE Temp:  [98.9 F (37.2 C)-99.9 F (37.7 C)] 98.9 F (37.2 C) (02/03 0727) Pulse Rate:  [77-140] 77 (02/03 0727) Cardiac Rhythm: Sinus tachycardia (02/03 0914) Resp:  [21-32] 23 (02/03 0727) BP: (90-187)/(47-79) 107/55 (02/03 0727) SpO2:  [88 %-100 %] 97 % (02/03 0727) Weight:  [63.6 kg (140 lb 4.8 oz)] 63.6 kg (140 lb 4.8 oz) (02/03 0400)  CBC:   Recent Labs Lab 10/10/16 1131 10/12/16 0524  WBC 6.6 15.0*  NEUTROABS 3.0  --   HGB 12.5 12.3  HCT 38.9 38.7  MCV 88.6 86.6  PLT 144* 144*    Basic Metabolic Panel:   Recent Labs Lab 10/10/16 1131 10/12/16 0524  NA 141 144  K 3.7 5.5*  CL 106 112*  CO2 27 21*  GLUCOSE 291* 206*  BUN 31* 51*  CREATININE 1.36* 2.56*  CALCIUM 10.1 10.1    Lipid Panel:     Component Value Date/Time   CHOL 131 10/11/2016 0040   TRIG 86 10/11/2016 0040   HDL 52 10/11/2016 0040   CHOLHDL 2.5  10/11/2016 0040   VLDL 17 10/11/2016 0040   LDLCALC 62 10/11/2016 0040   HgbA1c:  Lab Results  Component Value Date   HGBA1C 7.4 (H) 10/11/2016   Urine Drug Screen:     Component Value Date/Time   LABOPIA NONE DETECTED 10/10/2016 1055   COCAINSCRNUR NONE DETECTED 10/10/2016 1055   LABBENZ NONE DETECTED 10/10/2016 1055   AMPHETMU NONE DETECTED 10/10/2016 1055   THCU NONE DETECTED 10/10/2016 1055   LABBARB NONE DETECTED 10/10/2016 1055      IMAGING I have personally reviewed the radiological images below and agree with the radiology  interpretations.  Ct Head Wo Contrast 10/10/2016 Age-indeterminate right temporoparietal infarct, new since 2009 exam, likely subacute or chronic in age. Consider brain MRI without and with contrast for further evaluation. Diffuse cerebral and cerebellar atrophy, chronic small vessel disease, and old bilateral cerebellar and high right parietal infarcts. Electronically Signed   By: Earle Gell M.D.   On: 10/10/2016 12:09   Ct Angio Head W Or Wo Contrast Ct Angio Neck W Or Wo Contrast Ct Cerebral Perfusion W Contrast 10/10/2016  1. Small acute left ACA territory infarct. Surrounding penumbra involving bilateral ACA territories as above. 2. Positive CTA for emergent large vessel occlusion involving probable proximal azygos ACA, with distal reconstitution of bilateral A2 segments distally. Distal flow is irregular and attenuated, particularly on the right. 3. Extensive atheromatous disease throughout the carotid siphons with moderate diffuse narrowing. 4. Extensive atheromatous disease involving the posterior circulation, with multifocal moderate to severe left V4 stenoses, with additional moderate stenosis of the mid basilar artery. 5. Mild atheromatous disease involving the carotid arteries within the neck, with no flow-limiting stenosis identified. 6. Moderate stenosis at the origin of the vertebral arteries bilaterally.   Mr Brain Wo Contrast 10/10/2016 Patchy 2 cm area of right MCA territory ischemia in the posterior right temporal lobe with no hemorrhage or mass effect. Possible superimposed acute cortical lacune in the right parietal lobe. 2. No other acute ischemia identified, specifically no convincing left side diffusion abnormality. 3. Underlying advanced chronic ischemic disease otherwise appears not significantly changed since 2007.   Mr Jodene Nam Head Wo Contrast 10/10/2016 Intracranial MRA is significantly degraded by motion artifact. No strong evidence of emergent large vessel occlusion.   Dg  Chest Port 1 View 10/10/2016 1. Small left pleural effusion.   2-D echocardiogram Procedure narrative: Transthoracic echocardiography. Image   quality was adequate. The study was technically difficult, as a   result of poor sound wave transmission. Intravenous contrast   (Definity) was administered. - Left ventricle: The cavity size was normal. There was moderate   concentric and severe asymmetric hypertrophy of the septum.   Systolic function was normal. The estimated ejection fraction was   in the range of 60% to 65%. There was dynamic obstruction at   restin the mid cavity, with a peak gradient of 11 mm Hg. Wall   motion was normal; there were no regional wall motion   abnormalities. Features are consistent with a pseudonormal left   ventricular filling pattern, with concomitant abnormal relaxation   and increased filling pressure (grade 2 diastolic dysfunction). - Mitral valve: Calcified annulus. - Left atrium: The atrium was mildly dilated. - Pulmonary arteries: Systolic pressure could not be accurately   estimated.   PHYSICAL EXAM  Temp:  [98.9 F (37.2 C)-99.9 F (37.7 C)] 98.9 F (37.2 C) (02/03 0727) Pulse Rate:  [77-140] 77 (02/03 0727) Resp:  [21-32] 23 (02/03 0727) BP: (90-187)/(47-79) 107/55 (  02/03 0727) SpO2:  [88 %-100 %] 97 % (02/03 0727) Weight:  [63.6 kg (140 lb 4.8 oz)] 63.6 kg (140 lb 4.8 oz) (02/03 0400)  General - mourning throughout the encounter, nonverbal and not following commands.  Ophthalmologic - Fundi not visualized due to noncooperative.  Cardiovascular - Regular rate and rhythm.  Neuro - eyes open, not tracking, mourning throughout the encounter, not blinking to visual threat, nonverbal and not following any commands. PERRL, not moving eyes on command, facial grossly symmetrical, tongue exam not cooperative. On pain stimulation, withdraw slightly in BUEs, and no movement on BLEs. DTR 2+ and positive babinski bilaterally.     ASSESSMENT/PLAN Nancy [name] is a 83 y.o. female with history of diabetes mellitus, hypertension,  and coronary artery disease , admitted to [facility] with bilateral lower extremity weakness and aphasia with known right parietal infarct. Nonocclusive thrombus was found in M1. Transferred to [facility] for possible intervention. She did not receive IV t-PA due to being out of the window. IR was deferred secondary to modified Rankin scale of 3 at baseline and overall declining medical condition  Stroke:  Right MCA territory small infarcts and b/l ACA infarcts due to azygous ACA occlusions, heroic measures not pursued. Infarcts felt to be embolic secondary to unknown source  Resultant - nonverbal, bedbound  MRI  right MCA territory infarct (temporal lobe) and b/l ACA infarcts  MRA  no emergent large vessel occlusion  CTA head, neck, perfusion Positive emergent large vessel occlusion involving proximal azygous ACA . Extensive atheromatous carotid siphons, carotid arteries and bilateral vertebral arteries. posterior circulation with severe disease L VAs and mid basilar.  2D Echo  EF 60-65%  LDL 62  HgbA1c 7.4, 7.1 in October  Lovenox 30 mg sq daily for VTE prophylaxis Diet NPO time specified  aspirin 81 mg daily prior to admission, now on aspirin 300 mg suppository daily. Recommend palliative care consult.   Therapy recommendations:  SNF  Disposition:  pending (at baseline wheelchair-bound with declining status)   Hypertension  Variable, Stable currently  Permissive hypertension (OK if < 220/120) but gradually normalize in 5-7 days  Long-term BP goal normotensive  Hyperlipidemia  Home meds:  Zocor 40  Changed to Lipitor 20 mg in hospital  LDL 62, goal < 70  Continue statin at discharge  Diabetes type II  HgbA1c 7.1 in October  HbA1C 7.4, goal < 7.0  Uncontrolled  Hyperglycemia  SSI  Other Stroke Risk Factors  Advanced age  Coronary artery  disease status post CABG  Other Active Problems  Baseline dementia on [place] - bed/wheelchair bound, near nonverbal in NH  Chronic kidney disease stage III  Elevated troponin  History colon cancer  Chronic lymphocytic leukemia  Hospital day # 2  ATTENDING NOTE: Patient was seen and examined by me personally. Documentation reflects findings. The laboratory and radiographic studies reviewed by me. ROS:  pertinent positives could not be fully documented due to LOC and aphasia  Condition: unchanged  Assessment and plan completed by me personally and fully documented above. Plans/Recommendations include:     Leukocytosis worsening  Due to patient advance dementia and limited activity as well non verbal, recommend palliative care consult to discuss acute and long-term care goals with family  Stroke work-up completed and no further recommendations at this time  SIGNED BY: Dr. Elissa Hefty      To contact Stroke Continuity provider, please refer to [email or web address] After hours, contact General Neurology

## 2016-10-12 NOTE — Progress Notes (Signed)
HR sustaining in the 130s-140s.  Cardizem gtt restarted at 5 mg/hr.  Will continue to monitor.  Jodell Cipro

## 2016-10-12 NOTE — Progress Notes (Signed)
HR 70s, NSR.  BP 90s/50s.  Cardizem gtt stopped.  Will continue to monitor.  Jodell Cipro

## 2016-10-12 NOTE — Progress Notes (Signed)
Speech Language Pathology Treatment: Dysphagia  Patient Details Name: Nancy [name] MRN: [id number] DOB: 04-Apr-1925 Today's Date: 10/12/2016 Time: [id number] SLP Time Calculation (min) (ACUTE ONLY): 22 min  Assessment / Plan / Recommendation Clinical Impression  Patient seen for dysphagia treatment, family members at bedside. She presents with improved level of alertness and head control noted to be greatly improved. She is able to hold head upright without assistance, opens mouth and moves tongue slightly on command. Oral care performed; patient manipulates tongue in response to tactile stimulation. Presented with upgraded PO trials of ice chips, thin water via teaspoon, pureed solids. Oral manipulation appears adequate for ice chips, and swallow appears timely. Some anterior oral spillage with thin liquids, though no overt signs or symptoms of aspiration noted. Administered 1/2 teaspoon pureed; patient manipulates and sweeps tongue to retrieve bolus from lower lip. Mild oral residue noted after the swallow; suctioning/mouth care performed upon completion of trials due to change in LOA. Provided education to family and discussed goals of care; they do not wish to pursue feeding tube. Educated on role of SLP regarding rehabilitation of the swallow, determining most appropriate PO intake for comfort measures if appropriate. Given patient's improved head control, and appearance of timely swallow response, prognosis for oral feeding will likely improve with level of alertness. SLP will continue to follow to determine readiness for diet/instrumental examination.   HPI HPI: Nancy Burri Hamletis a 83 y.o.femalewho was taken [facility] following decreased verbal output. Bilateral lower extremity weakness and aphasia noted. Pt was not IV TPA candidate. She was transferred to [facility]. Imaging with findings of apparent infarct located within the parasagittal left frontal lobe, left ACA territory. Surrounding penumbra  within the adjacent left ACA territory with additional ischemic penumbra within the right ACA territory. MD also notes right parietal infarct. Pt wheelchair bound at baseline, family reports she talks and interacts, though this has been decreasing over time.      SLP Plan  Continue with current plan of care     Recommendations  Diet recommendations: NPO;Other(comment) (ice chips after oral care when alert) Medication Administration: Via alternative means Supervision: Trained caregiver to feed patient Postural Changes and/or Swallow Maneuvers: Seated upright 90 degrees                Oral Care Recommendations: Oral care QID Follow up Recommendations: Skilled Nursing facility Plan: Continue with current plan of care       [place], [place] CF-SLP Speech-Language Pathologist [phone]       Aliene Altes 10/12/2016, 5:40 PM

## 2016-10-13 ENCOUNTER — Inpatient Hospital Stay (HOSPITAL_COMMUNITY): Payer: Medicare Other

## 2016-10-13 LAB — BASIC METABOLIC PANEL
Anion gap: 12 (ref 5–15)
BUN: 62 mg/dL — ABNORMAL HIGH (ref 6–20)
CHLORIDE: 114 mmol/L — AB (ref 101–111)
CO2: 22 mmol/L (ref 22–32)
Calcium: 9.9 mg/dL (ref 8.9–10.3)
Creatinine, Ser: 2.69 mg/dL — ABNORMAL HIGH (ref 0.44–1.00)
GFR calc non Af Amer: 15 mL/min — ABNORMAL LOW (ref >60)
GFR, EST AFRICAN AMERICAN: 17 mL/min — AB (ref >60)
Glucose, Bld: 216 mg/dL — ABNORMAL HIGH (ref 65–99)
Potassium: 4.4 mmol/L (ref 3.5–5.1)
Sodium: 148 mmol/L — ABNORMAL HIGH (ref 135–145)

## 2016-10-13 LAB — GLUCOSE, CAPILLARY
GLUCOSE-CAPILLARY: 161 mg/dL — AB (ref 65–99)
GLUCOSE-CAPILLARY: 165 mg/dL — AB (ref 65–99)
GLUCOSE-CAPILLARY: 191 mg/dL — AB (ref 65–99)
Glucose-Capillary: 165 mg/dL — ABNORMAL HIGH (ref 65–99)
Glucose-Capillary: 208 mg/dL — ABNORMAL HIGH (ref 65–99)
Glucose-Capillary: 99 mg/dL (ref 65–99)

## 2016-10-13 LAB — CBC
HCT: 37.7 % (ref 36.0–46.0)
HEMOGLOBIN: 12 g/dL (ref 12.0–15.0)
MCH: 27.6 pg (ref 26.0–34.0)
MCHC: 31.8 g/dL (ref 30.0–36.0)
MCV: 86.9 fL (ref 78.0–100.0)
PLATELETS: 150 10*3/uL (ref 150–400)
RBC: 4.34 MIL/uL (ref 3.87–5.11)
RDW: 15.3 % (ref 11.5–15.5)
WBC: 12.1 10*3/uL — ABNORMAL HIGH (ref 4.0–10.5)

## 2016-10-13 MED ORDER — SODIUM CHLORIDE 0.9 % IV SOLN
INTRAVENOUS | Status: DC
Start: 2016-10-13 — End: 2016-10-14

## 2016-10-13 NOTE — Progress Notes (Signed)
Speech Language Pathology Treatment: Dysphagia  Patient Details Name: Nancy [name] MRN: [id number] DOB: 1925/09/07 Today's Date: 10/13/2016 Time: [id number] SLP Time Calculation (min) (ACUTE ONLY): 8 min  Assessment / Plan / Recommendation Clinical Impression  Patient seen for dysphagia treatment. Reduced alertness; with repositioning and oral care, alertness improves for PO trials. Masticates and initiates timely swallow with ice chips x3. Presented with thin liquids via teaspoon. Poor awareness of bolus, reduced labial seal and anterior bolus loss. Patient appears lethargic. Provided verbal and tactile cues, however due to decreased alertness further PO trials not appropriate at this time. When alert, swallow appears functional for ice chips, teaspoons of thin liquid. Spoke with RN who questions whether pt could tolerate teaspoons of Ensure for calories by mouth. Recommend continuing to offer ice chips, thin water via teaspoon after oral care; SLP will follow up with trials of Ensure when alert to determine if appropriate.   HPI HPI: Nancy Juris Hamletis a 83 y.o.femalewho was taken [facility] following decreased verbal output. Bilateral lower extremity weakness and aphasia noted. Pt was not IV TPA candidate. She was transferred to [facility]. Imaging with findings of apparent infarct located within the parasagittal left frontal lobe, left ACA territory. Surrounding penumbra within the adjacent left ACA territory with additional ischemic penumbra within the right ACA territory. MD also notes right parietal infarct. Pt wheelchair bound at baseline, family reports she talks and interacts, though this has been decreasing over time.      SLP Plan  Continue with current plan of care     Recommendations  Diet recommendations: NPO;Other(comment) (ice chips, water via teaspoon when alert) Medication Administration: Via alternative means Supervision: Trained caregiver to feed patient Compensations: Slow  rate;Small sips/bites Postural Changes and/or Swallow Maneuvers: Seated upright 90 degrees                Oral Care Recommendations: Oral care QID Follow up Recommendations: Skilled Nursing facility Plan: Continue with current plan of care       [place], [place] CF-SLP Speech-Language Pathologist [phone]   Nancy [name] 10/13/2016, 11:41 AM

## 2016-10-13 NOTE — Clinical Social Work Placement (Signed)
   CLINICAL SOCIAL WORK PLACEMENT  NOTE  Date:  10/13/2016  Patient Details  Name: Nancy [name] MRN: [id number] Date of Birth: 09/04/1925  Clinical Social Work is seeking post-discharge placement for this patient at the [place] level of care (*CSW will initial, date and re-position this form in  chart as items are completed):  Yes   Patient/family provided with [place] Work Department's list of facilities offering this level of care within the geographic area requested by the patient (or if unable, by the patient's family).  Yes   Patient/family informed of their freedom to choose among providers that offer the needed level of care, that participate in Medicare, Medicaid or managed care program needed by the patient, have an available bed and are willing to accept the patient.  Yes   Patient/family informed of [facility]'s ownership interest in [facility] and [facility], as well as of the fact that they are under no obligation to receive care at these facilities.  PASRR submitted to EDS on 10/13/16     PASRR number received on       Existing PASRR number confirmed on       FL2 transmitted to all facilities in geographic area requested by pt/family on 10/13/16     FL2 transmitted to all facilities within larger geographic area on       Patient informed that his/her managed care company has contracts with or will negotiate with certain facilities, including the following:            Patient/family informed of bed offers received.  Patient chooses bed at       Physician recommends and patient chooses bed at      Patient to be transferred to   on  .  Patient to be transferred to facility by       Patient family notified on   of transfer.  Name of family member notified:        PHYSICIAN       Additional Comment:    _______________________________________________ Serafina Mitchell, LCSWA 10/13/2016, 3:26 PM

## 2016-10-13 NOTE — Progress Notes (Signed)
Patient ID: Nancy [name], female   DOB: March 30, 1925, 83 y.o.   MRN: [id number]    PROGRESS NOTE  Nancy [name]  [id number] DOB: 12/14/1924 DOA: 10/10/2016  PCP: Virgie Dad, MD   Brief HPI: 83 y.o. female with medical history of diabetes mellitus, hypertension,  and coronary artery disease presented from [place] with onset of aphasia. Apparently, the patient ate breakfast and was conversant at the time on the morning of 10/10/2016. After breakfast, the nursing staff checked back on the patient, and they noted that the patient was aphasic.   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 posterior circulation with severe disease L VAs and mid basilar. - 2D Echo  EF 60-65% - LDL 62 - HgbA1c 7.4, 7.1 in October - now on aspirin 300 mg suppository daily - after [place] discussions by neurology team, PCT consulted, will follow up on recommendations   Elevated troponins - likely demand ischemia/NSTEMI in the setting of acute stroke - pt non verbal but per POA, DNR status confirmed - no aggressive interventions, conservative management for now - palliative care team consulted, further recommendations pending [place] discussions - ? If pt eligible for residential hospice   Hyperlipidemia - Home meds:  Zocor 40 - Changed to Lipitor 20 mg in hospital - LDL 62, goal < 70 - Continue statin at discharge if pt able to take PO but for now, pt still NPO  Hypertension, essential  - Allow for permissive hypertension - Hold lisinopril - SBP in 110's this AM   Leukocytosis - unclear etiology - also  with low grade fevers 99.9 F - CXR with no evidence of PNA - CBC In AM  Acute on CKD Stage 3, hyperkalemia  - Baseline creatinine 1.2-1.4 - now up, no specific nephrotoxic medication on board - renal US suggestive of chronic medical renal disease - K is now WNL - will add IVF to see if this will help   Hypernatremia - suspect pre renal etiology - place on IVF and repeat BMP in AM  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   Objective: Vitals:   10/13/16 0009 10/13/16 0428 10/13/16 0736 10/13/16 1041  BP: 133/89 (!) 115/59 (!) 167/71 (!) 110/56  Pulse: (!) 131 76 87 79  Resp: (!) 31 (!) 21 (!) 28 (!) 24  Temp: 99.1 F (37.3 C) 98.5 F (36.9 C) 98.9 F (37.2 C) 99.1 F (37.3 C)  TempSrc: Axillary  Oral Axillary  SpO2: 99% 100% 100% 92%  Weight:  61.2 kg (135 lb)    Height:  Intake/Output Summary (Last 24 hours) at 10/13/16 1216 Last data filed at 10/12/16 2341  Gross per 24 hour  Intake              122 ml  Output                0 ml  Net              122 ml   Filed Weights   10/11/16 0329 10/12/16 0400 10/13/16 0428  Weight: 64.5 kg (142 lb 4.8 oz) 63.6 kg (140 lb 4.8 oz) 61.2 kg (135 lb)    Examination:  General exam: Appears calm and comfortable, non verbal, moaning  Respiratory system: Respiratory effort normal. Diminished breath sounds at bases  Cardiovascular  system: S1 & S2 heard, RRR. No JVD, murmurs, rubs, gallops or clicks.   Data Reviewed: I have personally reviewed following labs and imaging studies  CBC:  Recent Labs Lab 10/10/16 1107 10/10/16 1131 10/12/16 0524 10/13/16 0207  WBC  --  6.6 15.0* 12.1*  NEUTROABS  --  3.0  --   --   HGB 13.3 12.5 12.3 12.0  HCT 39.0 38.9 38.7 37.7  MCV  --  88.6 86.6 86.9  PLT  --  144* 144* [id number]   Basic Metabolic Panel:  Recent Labs Lab 10/10/16 1107 10/10/16 1131 10/12/16 0524 10/13/16 0207  NA 145 141 144 148*  K 4.2 3.7 5.5* 4.4  CL 108 106 112* 114*  CO2  --  27 21* 22  GLUCOSE 308* 291* 206* 216*  BUN 40* 31* 51* 62*  CREATININE 1.50* 1.36* 2.56* 2.69*  CALCIUM  --  10.1 10.1 9.9   Liver Function Tests:  Recent Labs Lab 10/10/16 1131  AST 27  ALT 27  ALKPHOS 94  BILITOT 0.5  PROT 7.8  ALBUMIN 3.8    Recent Labs Lab 10/10/16 1206  AMMONIA 22   Coagulation Profile:  Recent Labs Lab 10/10/16 1229  INR 1.04   Cardiac Enzymes:  Recent Labs Lab 10/10/16 1607 10/11/16 0040 10/11/16 1330  TROPONINI 0.10* 0.31* 0.36*   CBG:  Recent Labs Lab 10/12/16 2031 10/13/16 0007 10/13/16 0419 10/13/16 0659 10/13/16 1108  GLUCAP 182* 208* 165* 161* 165*   Lipid Profile:  Recent Labs  10/11/16 0040  CHOL 131  HDL 52  LDLCALC 62  TRIG 86  CHOLHDL 2.5   Thyroid Function Tests:  Recent Labs  10/11/16 0040  TSH 1.703   Urine analysis:    Component Value Date/Time   COLORURINE STRAW (A) 10/10/2016 1055   APPEARANCEUR CLEAR 10/10/2016 1055   LABSPEC 1.014 10/10/2016 1055   PHURINE 7.0 10/10/2016 1055   GLUCOSEU >=500 (A) 10/10/2016 1055   HGBUR NEGATIVE 10/10/2016 1055   BILIRUBINUR NEGATIVE 10/10/2016 1055   KETONESUR NEGATIVE 10/10/2016 1055   PROTEINUR 100 (A) 10/10/2016 1055   UROBILINOGEN 0.2 08/07/2008 0025   NITRITE NEGATIVE 10/10/2016 1055   LEUKOCYTESUR NEGATIVE 10/10/2016 1055   Recent Results (from the past 240 hour(s))  Blood  culture (routine x 2)     Status: None (Preliminary result)   Collection Time: 10/10/16 11:35 AM  Result Value Ref Range Status   Specimen Description BLOOD RIGHT HAND  Final   Special Requests BOTTLES DRAWN AEROBIC ONLY 6cc  Final   Culture NO GROWTH < 24 HOURS  Final   Report Status PENDING  Incomplete  Blood culture (routine x 2)     Status: None (Preliminary result)   Collection Time: 10/10/16  12:06 PM  Result Value Ref Range Status   Specimen Description BLOOD LEFT ARM  Final   Special Requests BOTTLES DRAWN AEROBIC AND ANAEROBIC 6CC  Final   Culture NO GROWTH < 24 HOURS  Final   Report Status PENDING  Incomplete  MRSA PCR Screening     Status: None   Collection Time: 10/10/16  8:46 PM  Result Value Ref Range Status   MRSA by PCR NEGATIVE NEGATIVE Final    Radiology Studies: US Renal  Result Date: 10/13/2016 CLINICAL DATA:  Acute renal failure. EXAM: RENAL / URINARY TRACT ULTRASOUND COMPLETE COMPARISON:  Abdominal ultrasound 11/19/2010 FINDINGS: Right Kidney: Length: 11.6 cm. No hydronephrosis. There is increased renal echogenicity. At least 3 cystic lesions are seen in the right kidney. Complex cyst measuring 3.4 x 4.3 x 3.3 cm has internal septations with blood flow. There are 2 additional simple cysts 4.3 cm in the lower pole and 6.4 cm in the upper pole. Left Kidney: Length: 10.1 cm. No hydronephrosis. There is increased renal echogenicity. Two simple cysts measure 5.5 and 2.7 cm respectively. Bladder: Appears normal for degree of bladder distention. IMPRESSION: 1. No hydronephrosis. Increased renal echogenicity suggesting chronic medical renal disease. 2. Bilateral renal cysts. One of these cysts on the right is complex with internal septation that demonstrates blood flow. Recommend further characterization with MRI given diminished renal function. Cysts have increased in size from comparison ultrasound 2012. Electronically Signed   By: Jeb Levering M.D.   On: 10/13/2016 02:33    Dg Chest Port 1 View  Result Date: 10/12/2016 CLINICAL DATA:  Dyspnea EXAM: PORTABLE CHEST 1 VIEW COMPARISON:  10/10/2016 FINDINGS: The heart size and mediastinal contours are within normal limits. Both lungs are clear. The visualized skeletal structures are unremarkable. IMPRESSION: No acute abnormality noted. Electronically Signed   By: Inez Catalina M.D.   On: 10/12/2016 18:18   Scheduled Meds: .  stroke: mapping our early stages of recovery book   Does not apply Once  . aspirin  300 mg Rectal Daily   Or  . aspirin  325 mg Oral Daily  . atorvastatin  20 mg Oral QHS  . chlorhexidine  15 mL Mouth Rinse BID  . enoxaparin (LOVENOX) injection  30 mg Subcutaneous Q24H  . insulin aspart  0-9 Units Subcutaneous Q4H  . insulin detemir  6 Units Subcutaneous Q24H  . mouth rinse  15 mL Mouth Rinse q12n4p   Continuous Infusions: . sodium chloride    . diltiazem (CARDIZEM) infusion 5 mg/hr (10/12/16 1747)     LOS: 3 days   Time spent: 20 minutes   Faye Ramsay, MD [facility] Pager [phone]  If 7PM-7AM, please contact night-coverage [email or web address] Password [email or web address] 10/13/2016, 12:16 PM

## 2016-10-14 DIAGNOSIS — Z515 Encounter for palliative care: Secondary | ICD-10-CM

## 2016-10-14 LAB — BASIC METABOLIC PANEL
Anion gap: 7 (ref 5–15)
BUN: 64 mg/dL — AB (ref 6–20)
CALCIUM: 9.8 mg/dL (ref 8.9–10.3)
CO2: 22 mmol/L (ref 22–32)
Chloride: 122 mmol/L — ABNORMAL HIGH (ref 101–111)
Creatinine, Ser: 2.2 mg/dL — ABNORMAL HIGH (ref 0.44–1.00)
GFR calc Af Amer: 22 mL/min — ABNORMAL LOW (ref >60)
GFR, EST NON AFRICAN AMERICAN: 19 mL/min — AB (ref >60)
Glucose, Bld: 154 mg/dL — ABNORMAL HIGH (ref 65–99)
POTASSIUM: 3.9 mmol/L (ref 3.5–5.1)
Sodium: 151 mmol/L — ABNORMAL HIGH (ref 135–145)

## 2016-10-14 LAB — CBC
HCT: 39.5 % (ref 36.0–46.0)
Hemoglobin: 12.4 g/dL (ref 12.0–15.0)
MCH: 27.5 pg (ref 26.0–34.0)
MCHC: 31.4 g/dL (ref 30.0–36.0)
MCV: 87.6 fL (ref 78.0–100.0)
PLATELETS: 154 10*3/uL (ref 150–400)
RBC: 4.51 MIL/uL (ref 3.87–5.11)
RDW: 15.6 % — AB (ref 11.5–15.5)
WBC: 12 10*3/uL — ABNORMAL HIGH (ref 4.0–10.5)

## 2016-10-14 LAB — GLUCOSE, CAPILLARY
GLUCOSE-CAPILLARY: 136 mg/dL — AB (ref 65–99)
GLUCOSE-CAPILLARY: 149 mg/dL — AB (ref 65–99)
Glucose-Capillary: 148 mg/dL — ABNORMAL HIGH (ref 65–99)
Glucose-Capillary: 162 mg/dL — ABNORMAL HIGH (ref 65–99)
Glucose-Capillary: 165 mg/dL — ABNORMAL HIGH (ref 65–99)
Glucose-Capillary: 179 mg/dL — ABNORMAL HIGH (ref 65–99)

## 2016-10-14 MED ORDER — DEXTROSE 5 % IV SOLN: INTRAVENOUS | Status: DC

## 2016-10-14 MED ORDER — MORPHINE SULFATE (CONCENTRATE) 10 MG/0.5ML PO SOLN
5.0000 mg | ORAL | Status: DC | PRN
  Administered 2016-10-14: 5 mg via ORAL
  Filled 2016-10-14: qty 0.5

## 2016-10-14 NOTE — Progress Notes (Signed)
Patient ID: Nancy [name], female   DOB: 02/07/1925, 83 y.o.   MRN: [id number]    PROGRESS NOTE  Nancy [name]  [id number] DOB: 30-Jan-1925 DOA: 10/10/2016  PCP: Virgie Dad, MD   Brief HPI: 83 y.o. female with medical history of diabetes mellitus, hypertension,  and coronary artery disease presented from [place] with onset of aphasia. Apparently, the patient ate breakfast and was conversant at the time on the morning of 10/10/2016. After breakfast, the nursing staff checked back on the patient, and they noted that the patient was aphasic.   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 posterior circulation with severe disease L VAs and mid basilar. - 2D Echo  EF 60-65% - LDL 62 - HgbA1c 7.4, 7.1 in October - now on aspirin 300 mg suppository daily - after [place] discussions by neurology team, PCT consulted, appreciate assistance   Elevated troponins - likely demand ischemia/NSTEMI in the setting of acute stroke - pt non verbal but per POA, DNR status confirmed - no aggressive interventions, conservative management for now - palliative care team consulted, further recommendations pending family decisions  - ? If pt eligible for residential hospice   Hyperlipidemia - Home meds:  Zocor 40 - Changed to Lipitor 20 mg in hospital - LDL 62, goal < 70 - Continue statin at discharge if pt able to take PO but for now, pt still NPO  Hypertension, essential  - Allow for permissive hypertension - Hold lisinopril - SBP in 110's this AM   Leukocytosis 2/4 - unclear etiology - also with low  grade fevers 99.9 F - CXR with no evidence of PNA - family in agreement to shift more toward comfort care with no further escalation of care   Acute on CKD Stage 3, hyperkalemia  - Baseline creatinine 1.2-1.4 - now up, no specific nephrotoxic medication on board - renal US suggestive of chronic medical renal disease - K is now WNL - IVF started and Cr is trending down - pt unfortunately unable to take anything PO and as soon as we stop IVF, Cr will likely trend up - shifting more towards comfort care, family to make final decision   Hypernatremia - suspect pre renal etiology, no oral intake  - fluids changed to dextrose 5%  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   Objective: Vitals:   10/13/16 1610 10/13/16 2000 10/14/16 0013 10/14/16 0452  BP: 132/66 139/90 (!) 105/57 (!) 141/96  Pulse: 79 (!) 133 67 93  Resp: 17 12 18  Temp: (!) 100.6 F (38.1 C) 99.8 F (37.7 C) 100.2 F (37.9 C) 98.4 F (36.9 C)  TempSrc: Axillary Axillary Axillary Axillary  SpO2: 100% 100% 97% 100%  Weight:    63.7 kg (140 lb 8 oz)  Height:        Intake/Output Summary (Last 24 hours) at 10/14/16 1224 Last data filed at 10/14/16 0640  Gross per 24 hour  Intake           906.67 ml  Output                0 ml  Net           906.67 ml   Filed Weights   10/12/16 0400  10/13/16 0428 10/14/16 0452  Weight: 63.6 kg (140 lb 4.8 oz) 61.2 kg (135 lb) 63.7 kg (140 lb 8 oz)    Examination:  General exam: Appears calm and comfortable, non verbal, moaning  Respiratory system: Respiratory effort normal. Diminished breath sounds at bases  Cardiovascular system: S1 & S2 heard, RRR. No JVD, murmurs, rubs, gallops or clicks.   Data Reviewed: I have personally reviewed following labs and imaging studies  CBC:  Recent Labs Lab 10/10/16 1107 10/10/16 1131 10/12/16 0524 10/13/16 0207 10/14/16 0350  WBC  --  6.6 15.0* 12.1* 12.0*  NEUTROABS  --  3.0  --   --   --   HGB 13.3 12.5 12.3 12.0 12.4  HCT 39.0 38.9 38.7 37.7 39.5  MCV  --  88.6 86.6 86.9 87.6  PLT  --  144* 144* 150 [id number]   Basic Metabolic Panel:  Recent Labs Lab 10/10/16 1107 10/10/16 1131 10/12/16 0524 10/13/16 0207 10/14/16 0350  NA 145 141 144 148* 151*  K 4.2 3.7 5.5* 4.4 3.9  CL 108 106 112* 114* 122*  CO2  --  27 21* 22 22  GLUCOSE 308* 291* 206* 216* 154*  BUN 40* 31* 51* 62* 64*  CREATININE 1.50* 1.36* 2.56* 2.69* 2.20*  CALCIUM  --  10.1 10.1 9.9 9.8   Liver Function Tests:  Recent Labs Lab 10/10/16 1131  AST 27  ALT 27  ALKPHOS 94  BILITOT 0.5  PROT 7.8  ALBUMIN 3.8    Recent Labs Lab 10/10/16 1206  AMMONIA 22   Coagulation Profile:  Recent Labs Lab 10/10/16 1229  INR 1.04   Cardiac Enzymes:  Recent Labs Lab 10/10/16 1607 10/11/16 0040 10/11/16 1330  TROPONINI 0.10* 0.31* 0.36*   CBG:  Recent Labs Lab 10/13/16 2008 10/14/16 0011 10/14/16 0450 10/14/16 0806 10/14/16 1220  GLUCAP 191* 165* 162* 179* 148*   Lipid Profile: No results for input(s): CHOL, HDL, LDLCALC, TRIG, CHOLHDL, LDLDIRECT in the last 72 hours. Thyroid Function Tests: No results for input(s): TSH, T4TOTAL, FREET4, T3FREE, THYROIDAB in the last 72 hours. Urine analysis:    Component Value Date/Time   COLORURINE STRAW (A) 10/10/2016 1055   APPEARANCEUR CLEAR 10/10/2016  1055   LABSPEC 1.014 10/10/2016 1055   PHURINE 7.0 10/10/2016 1055   GLUCOSEU >=500 (A) 10/10/2016 1055   HGBUR NEGATIVE 10/10/2016 1055   BILIRUBINUR NEGATIVE 10/10/2016 1055   KETONESUR NEGATIVE 10/10/2016 1055   PROTEINUR 100 (A) 10/10/2016 1055   UROBILINOGEN 0.2 08/07/2008 0025   NITRITE NEGATIVE 10/10/2016 1055   LEUKOCYTESUR NEGATIVE 10/10/2016 1055   Recent Results (from the past 240 hour(s))  Blood culture (routine x 2)     Status: None (Preliminary result)   Collection Time: 10/10/16 11:35 AM  Result Value  Ref Range Status   Specimen Description BLOOD RIGHT HAND  Final   Special Requests BOTTLES DRAWN AEROBIC ONLY 6cc  Final   Culture NO GROWTH < 24 HOURS  Final   Report Status PENDING  Incomplete  Blood culture (routine x 2)     Status: None (Preliminary result)   Collection Time: 10/10/16 12:06 PM  Result Value Ref Range Status   Specimen Description BLOOD LEFT ARM  Final   Special Requests BOTTLES DRAWN AEROBIC AND ANAEROBIC 6CC  Final   Culture NO GROWTH < 24 HOURS  Final   Report Status PENDING  Incomplete  MRSA PCR Screening     Status: None   Collection Time: 10/10/16  8:46 PM  Result Value Ref Range Status   MRSA by PCR NEGATIVE NEGATIVE Final    Radiology Studies: US Renal  Result Date: 10/13/2016 CLINICAL DATA:  Acute renal failure. EXAM: RENAL / URINARY TRACT ULTRASOUND COMPLETE COMPARISON:  Abdominal ultrasound 11/19/2010 FINDINGS: Right Kidney: Length: 11.6 cm. No hydronephrosis. There is increased renal echogenicity. At least 3 cystic lesions are seen in the right kidney. Complex cyst measuring 3.4 x 4.3 x 3.3 cm has internal septations with blood flow. There are 2 additional simple cysts 4.3 cm in the lower pole and 6.4 cm in the upper pole. Left Kidney: Length: 10.1 cm. No hydronephrosis. There is increased renal echogenicity. Two simple cysts measure 5.5 and 2.7 cm respectively. Bladder: Appears normal for degree of bladder distention. IMPRESSION: 1. No  hydronephrosis. Increased renal echogenicity suggesting chronic medical renal disease. 2. Bilateral renal cysts. One of these cysts on the right is complex with internal septation that demonstrates blood flow. Recommend further characterization with MRI given diminished renal function. Cysts have increased in size from comparison ultrasound 2012. Electronically Signed   By: Jeb Levering M.D.   On: 10/13/2016 02:33   Dg Chest Port 1 View  Result Date: 10/12/2016 CLINICAL DATA:  Dyspnea EXAM: PORTABLE CHEST 1 VIEW COMPARISON:  10/10/2016 FINDINGS: The heart size and mediastinal contours are within normal limits. Both lungs are clear. The visualized skeletal structures are unremarkable. IMPRESSION: No acute abnormality noted. Electronically Signed   By: Inez Catalina M.D.   On: 10/12/2016 18:18   Scheduled Meds: .  stroke: mapping our early stages of recovery book   Does not apply Once  . chlorhexidine  15 mL Mouth Rinse BID  . mouth rinse  15 mL Mouth Rinse q12n4p   Continuous Infusions: . sodium chloride Stopped (10/13/16 1234)  . dextrose       LOS: 4 days   Time spent: 20 minutes   Nancy Ramsay, MD [facility] Pager [phone]  If 7PM-7AM, please contact night-coverage [email or web address] Password [email or web address] 10/14/2016, 12:24 PM

## 2016-10-14 NOTE — Progress Notes (Signed)
Speech Language Pathology Treatment: Dysphagia  Patient Details Name: DEVONYA NEES MRN: [id number] DOB: 1925/03/12 Today's Date: 10/14/2016 Time: [id number] SLP Time Calculation (min) (ACUTE ONLY): 20 min  Assessment / Plan / Recommendation Clinical Impression  Patient seen for po trials. Max verbal cues provided for eye opening to obtain alert state. With tactile cues to lower lip, patient able to open oral cavity for acceptance of ice chips. Patient with immediate oral manipulation which was slow and with decreased bolus cohesion. No pharyngeal swallow initiated initially. Initiated x1 with subsequent bolus without overt s/s of aspiration however unable to assess vocal quality given non-verbal state. Tsp trial of thin liquid provided with 100% anterior loss of bolus. Discussed patient performance with niece/POA. Per niece, plan is for SNF with hospice. Discussed possibility of comfort pos with known aspiration risk. Unlikely that patient will consume much at this time given lethargy, altered mentation. SLP will continue to f/u to monitor for changes.    HPI HPI: Briggette [place]is a 83 y.o.femalewho was taken [facility] following decreased verbal output. Bilateral lower extremity weakness and aphasia noted. Pt was not IV TPA candidate. She was [facility]. Imaging with findings of apparent infarct located within the parasagittal left frontal lobe, left ACA territory. Surrounding penumbra within the adjacent left ACA territory with additional ischemic penumbra within the right ACA territory. MD also notes right parietal infarct. Pt wheelchair bound at baseline, family reports she talks and interacts, though this has been decreasing over time.      SLP Plan  Continue with current plan of care     Recommendations  Diet recommendations: NPO (ice chips per MD discretion) Medication Administration: Via alternative means Compensations: Slow rate;Small sips/bites Postural Changes and/or  Swallow Maneuvers: Seated upright 90 degrees                Oral Care Recommendations: Oral care QID Follow up Recommendations: Skilled Nursing facility Plan: Continue with current plan of care       [place], [place] [phone]    [place] 10/14/2016, 3:51 PM

## 2016-10-14 NOTE — Care Management Important Message (Signed)
Important Message  Patient Details  Name: Nancy [name] MRN: [id number] Date of Birth: 1925-01-16   Medicare Important Message Given:  Yes    Niaja Stickley [address] 10/14/2016, 4:33 PM

## 2016-10-14 NOTE — Consult Note (Signed)
Consultation Note Date: 10/14/2016   Patient Name: Nancy [name]  DOB: [birth year]  MRN: [id number]  Age / Sex: 83 y.o., female  PCP: Virgie Dad, MD Referring Physician: Theodis Blaze, MD  Reason for Consultation: Establishing goals of care and Psychosocial/spiritual support   HPI/Patient Profile: 83 y.o. female  admitted on 10/10/2016 with past  medical history of diabetes mellitus, hypertension,  and coronary artery disease presented from [place] with onset of aphasia.   At baseline, the patient is able to feed herself but needs assistance with her other ADLs.     CT of the brain showed age indeterminate right temporal parietal infarct and diffuse atrophy.  CT perfusion/CT angiogram which was performed and did demonstrate ACA occlusion with some penumbra.  Family faces advanced directive decisions and anticipatory care needs   Clinical Assessment and Goals of Care:   This NP Wadie Lessen reviewed medical records, received report from team, assessed the patient and then meet at the patient's bedside along with her HPOA/Nancy [name] to discuss diagnosis, prognosis, GOC, EOL wishes disposition and options.  A detailed discussion was had today regarding advanced directives.  Concepts specific to code status, artifical feeding and hydration, continued IV antibiotics and rehospitalization was had.  The difference between a aggressive medical intervention path  and a palliative comfort care path for this patient at this time was had.  Values and goals of care important to patient and family were attempted to be elicited.  MOST form intoduced  Concept of Hospice and Palliative Care were discussed  Natural trajectory and expectations at EOL were discussed.  Questions and concerns addressed.   Family encouraged to call with questions or concerns.  PMT will continue to support  holistically.     SUMMARY OF RECOMMENDATIONS    Code Status/Advance Care Planning:  DNR   Symptom Management:   Pain/Dyspnea: Roxanol 5 mg po/sl every 1 hr prn  Agitation: Ativan 1 mg po/sl every 4 hrs prn  Palliative Prophylaxis:   Aspiration, Bowel Regimen, Frequent Pain Assessment and Oral Care  Additional Recommendations (Limitations, Scope, Preferences):  Avoid Hospitalization, Minimize Medications, No Artificial Feeding, No Diagnostics, No Glucose Monitoring, No Hemodialysis, No IV Antibiotics, No IV Fluids and No Lab Draws  Psycho-social/Spiritual:   Desire for further Chaplaincy support:no  Additional Recommendations: Education on Hospice and Grief/Bereavement Support  Prognosis:   < 4 weeks    Family has made decision for shift to full comfort and hope that she can retrun to the [facility] for EOL care with hospice services   Discharge Planning:   Called me this afternoon  with her decision for patient to return back to [facility] with hospice services in place.    Call Gwenlyn Fudge before any transport.     [facility]      Primary Diagnoses: Present on Admission: . Cerebrovascular accident (CVA) ([place])   I have reviewed the medical record, interviewed the patient and family, and examined the patient. The following aspects  are pertinent.  Past Medical History:  Diagnosis Date  . Anemia   . CAD (coronary atherosclerotic disease)   . Cardiac dysrhythmia, unspecified   . Colon cancer ([place]) 1989  . Depressive disorder   . Diabetes mellitus   . Hypertension   . Hypothyroidism   . Organic brain syndrome   . Syncope    Social History   Social History  . Marital status: Single    Spouse name: N/A  . Number of children: N/A  . Years of education: N/A   Social History Main Topics  . Smoking status: Never Smoker  . Smokeless tobacco: Never Used  . Alcohol use No  . Drug use: No  . Sexual activity: Not Asked    Other Topics Concern  . None   Social History Narrative  . None   Family History  Problem Relation Age of Onset  . Anesthesia problems Neg Hx   . Hypotension Neg Hx   . Malignant hyperthermia Neg Hx   . Pseudochol deficiency Neg Hx    Scheduled Meds: .  stroke: mapping our early stages of recovery book   Does not apply Once  . aspirin  300 mg Rectal Daily   Or  . aspirin  325 mg Oral Daily  . atorvastatin  20 mg Oral QHS  . chlorhexidine  15 mL Mouth Rinse BID  . enoxaparin (LOVENOX) injection  30 mg Subcutaneous Q24H  . insulin aspart  0-9 Units Subcutaneous Q4H  . insulin detemir  6 Units Subcutaneous Q24H  . mouth rinse  15 mL Mouth Rinse q12n4p   Continuous Infusions: . sodium chloride Stopped (10/13/16 1234)  . sodium chloride 50 mL/hr at 10/13/16 1232  . diltiazem (CARDIZEM) infusion Stopped (10/13/16 1100)   PRN Meds:.acetaminophen **OR** acetaminophen (TYLENOL) oral liquid 160 mg/5 mL **OR** acetaminophen, LORazepam, metoprolol, senna-docusate, white petrolatum Medications Prior to Admission:  Prior to Admission medications   Medication Sig Start Date End Date Taking? Authorizing Provider  aspirin EC 81 MG tablet Take 81 mg by mouth daily.   Yes Historical Provider, MD  donepezil (ARICEPT) 10 MG tablet Take 10 mg by mouth daily. Daily med   Yes Historical Provider, MD  ferrous sulfate 325 (65 FE) MG tablet Take 325 mg by mouth daily with breakfast.    Yes Historical Provider, MD  glimepiride (AMARYL) 1 MG tablet Take 1 tablet (1 mg total) by mouth daily with breakfast. 01/09/16  Yes Hendricks Limes, MD  lisinopril (PRINIVIL,ZESTRIL) 10 MG tablet Take 10 mg by mouth daily. Hold for systolic [id number]   Yes Historical Provider, MD  magnesium hydroxide (MILK OF MAGNESIA) 400 MG/5ML suspension Take 30 mLs by mouth daily as needed for mild constipation.   Yes Historical Provider, MD  memantine (NAMENDA) 5 MG tablet Take 5 mg by mouth 2 (two) times daily.   Yes Historical  Provider, MD  simvastatin (ZOCOR) 40 MG tablet Take 40 mg by mouth at bedtime. Hold for HLD   Yes Historical Provider, MD  sitaGLIPtin (JANUVIA) 25 MG tablet Take 25 mg by mouth daily.   Yes Historical Provider, MD  [facility] 1 application topically daily as needed. Apply every shift as need on top of buttocks.   Yes Historical Provider, MD   No Known Allergies Review of Systems  Unable to perform ROS: Acuity of condition    Physical Exam  Constitutional:  -frail, elderly, non verbal and unable to follow commands  HENT:  - noted mouth and  nose   Cardiovascular: Normal rate, regular rhythm and normal heart sounds.   Pulmonary/Chest: She has decreased breath sounds in the right lower field and the left lower field.  Skin: Skin is warm and dry.    Vital Signs: BP (!) 141/96 (BP Location: Left Arm)   Pulse 93   Temp 98.4 F (36.9 C) (Axillary)   Resp 18   Ht 5\' 5"  (1.651 m)   Wt 63.7 kg (140 lb 8 oz)   SpO2 100%   BMI 23.38 kg/m  Pain Assessment: Faces   Pain Score: 0-No pain   SpO2: SpO2: 100 % O2 Device:SpO2: 100 % O2 Flow Rate: .O2 Flow Rate (L/min): 1 L/min  IO: Intake/output summary:  Intake/Output Summary (Last 24 hours) at 10/14/16 0941 Last data filed at 10/14/16 0640  Gross per 24 hour  Intake           963.25 ml  Output                0 ml  Net           963.25 ml    LBM: Last BM Date: 10/13/16 Baseline Weight: Weight: 63.5 kg (140 lb) Most recent weight: Weight: 63.7 kg (140 lb 8 oz)      Palliative Assessment/Data: 20% at best   Left verbal voice mail for  Dr Doyle Askew and SW/Bryant  Time In: 0930 Time Out: 1045 Time Total: 75 min Greater than 50%  of this time was spent counseling and coordinating care related to the above assessment and plan.  Signed by: Wadie Lessen, NP   Please contact Palliative Medicine Team phone at [phone] for questions and concerns.  For individual provider: See Shea Evans

## 2016-10-14 NOTE — Clinical Social Work Note (Signed)
Voicemail left on palliative medicine number requesting assistance with palliative care consult placed 2/2 as goals of care need to be established. CSW will wait for call back.  Liz Beach MSW, [place], [place], [id number]

## 2016-10-15 ENCOUNTER — Inpatient Hospital Stay
Admission: RE | Admit: 2016-10-15 | Discharge: 2016-11-07 | Disposition: E | Payer: Medicare Other | Source: Ambulatory Visit | Attending: Internal Medicine | Admitting: Internal Medicine

## 2016-10-15 LAB — CULTURE, BLOOD (ROUTINE X 2)
CULTURE: NO GROWTH
Culture: NO GROWTH

## 2016-10-15 MED ORDER — MORPHINE SULFATE (CONCENTRATE) 10 MG/0.5ML PO SOLN: 5.0000 mg | ORAL | 0 refills | Status: AC | PRN

## 2016-10-15 NOTE — Clinical Social Work Note (Signed)
Per MD patient ready to DC back to [facility] with [facility]. RN, patient/family Caren Griffins), and facility notified of patient's DC. RN given number for report. DC packet on patient's chart. Ambulance transport requested for patient. CSW signing off at this time.   Liz Beach MSW, [place], [place], [id number]

## 2016-10-15 NOTE — Discharge Instructions (Signed)
Deconditioning °Deconditioning refers to the changes in your body that occur during a period of inactivity. The changes happen in your heart, lungs, and muscles. They decrease your ability to be active, and they make you feel tired and weak. °There are three stages of deconditioning: °· Mild deconditioning. At this stage, you will notice a change in your ability to do your usual exercise activities, such as running, biking, or swimming. °· Moderate deconditioning. At this stage, you will notice a change in your ability to do normal everyday activities, such as walking, grocery shopping, and doing chores. °· Severe deconditioning. At this stage, you will notice a change in your ability to do minimal activity or normal self-care. °Deconditioning can occur after only a few days of inactivity. The longer the period of inactivity, the more severe the deconditioning will be, and the longer it will take to return to your previous level of functioning. °What are the causes? °Deconditioning is often caused by inactivity due to: °· Illnesses, such as cancer, stroke, heart attack, fibromyalgia, and chronic fatigue syndrome. °· Injuries, especially back injuries, broken bones, and ligament and tendon injuries. °· A long stay in the hospital. °· Pregnancy, especially if long periods of bed rest are needed. °What increases the risk? °This condition is more likely to develop in: °· People who are hospitalized. °· People on bed rest. °· People who are obese. °· People with poor nutrition. °· Elderly adults. °· People with injuries or illnesses that interfere with movement and activity. °What are the signs or symptoms? °Symptoms of deconditioning include: °· Weakness. °· Tiredness. °· Shortness of breath with minor exertion. °· A faster-than-normal heartbeat. You may not notice this without taking your pulse. °· Pain or discomfort with activity. °· Decreased strength. °· Decreased sense of balance. °· Decreased  endurance. °· Difficulty doing your usual forms of exercise. °· Difficulty doing activities of daily living, such as grocery shopping or chores. °· Difficulty walking around the house and doing basic self-care, such as getting to the bathroom, preparing meals, or doing laundry. °How is this diagnosed? °Deconditioning is diagnosed based on your medical history and a physical exam. During the physical exam, your health care provider will check for signs of deconditioning, such as: °· Decreased size of muscles. °· Decreased strength. °· Trouble with balance. °· Shortness of breath or abnormally increased heart rate after minor exertion. °How is this treated? °Treatment for deconditioning usually involves following a structured exercise program in which activity is increased gradually. Your health care provider will determine which exercises are right for you. The exercise program will likely include aerobic exercise and strength training: °· Aerobic exercise helps improve the functioning of the heart and lungs as well as the muscles. °· Strength training helps improve muscle size and strength. °Both of these types of exercise will improve your endurance. You may be referred to a physical therapist who can create a safe strengthening program for you to follow. °Follow these instructions at home: °· Follow the exercise program that is recommended by your health care provider or physical therapist. °· Do not increase your exercise any faster than directed. °· Eat a healthy diet. °· Do not use any products that contain nicotine or tobacco, such as cigarettes and e-cigarettes. If you need help quitting, ask your health care provider. °· Take over-the-counter and prescription medicines only as told by your health care provider. °· Keep all follow-up visits as told by your health care provider. This is important. °Contact a   health care provider if: °· You are not able to carry out the prescribed exercise program. °· You are  becoming more and more fatigued and weak. °· You become light-headed when rising to a sitting or standing position. °· Your level of endurance decreases after it has improved. °Get help right away if: °· You have chest pain. °· You are very short of breath. °· You have any episodes of passing out. °This information is not intended to replace advice given to you by your health care provider. Make sure you discuss any questions you have with your health care provider. °Document Released: 01/10/2014 Document Revised: 03/15/2016 Document Reviewed: 11/25/2015 °Elsevier Interactive Patient Education © 2017 [employer] ° °

## 2016-10-15 NOTE — Progress Notes (Signed)
PT Cancellation and Discharge Note  Patient Details Name: Nancy [name] MRN: [id number] DOB: Feb 16, 1925   Cancelled Treatment:    Reason Eval/Treat Not Completed: Other (comment).  Noted Palliative note indicating Full Comfort and confirmed this with RN.  Will sign off PT at this time and need new orders should wishes change.     [name] 10/17/2016, 9:24 AM

## 2016-10-15 NOTE — Progress Notes (Signed)
Report called to nurse at [facility]. Patient is awaiting to be picked up by [facility]. Daughter will be notified when patient leaves the hospital per request.

## 2016-10-15 NOTE — Discharge Summary (Signed)
Physician Discharge Summary  Nancy [name] [id number] DOB: 18-Feb-1925 DOA: 10/10/2016  PCP: Virgie Dad, MD  Admit date: 10/10/2016 Discharge date: 10/19/2016  Recommendations for Outpatient Follow-up:  Pt will be discharge to SNF with hospice team Please note that most of the PO meds stopped as pt unable to take anything PO If oral intake improves, ok for comfort feeding   Discharge Diagnoses:  Active Problems:   Right hemiparesis (HCC)   CKD (chronic kidney disease), stage III   Dementia without behavioral disturbance   Diabetes mellitus type 2 with complications ([place])   Cerebrovascular accident (CVA) ([place])   Palliative care by specialist   Discharge Condition: Stable  Diet recommendation: Heart healthy diet discussed in details   Brief HPI: 83 y.o.femalewith medical history of diabetes mellitus, hypertension, and coronary artery disease presented from [place] onset of aphasia. Apparently, the patient ate breakfast and was conversant at the time on the morning of 10/10/2016. After breakfast, the nursing staff checked back on the patient, and they noted that the patient was aphasic.   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 posterior circulation with severe disease L VAs and mid basilar. - 2D EchoEF 60-65% - LDL62 - HgbA1c7.4, 7.1 in October - after [place] discussions by neurology team, PCT consulted, comfort care   Elevated troponins - likely demand ischemia/NSTEMI in the setting of acute stroke - pt non verbal but per POA, DNR status confirmed - no aggressive  interventions, conservative management for now - palliative care team consulted, comfort care to continue   Hyperlipidemia - Home meds: Zocor 40 - Changed to Lipitor 20 mg in hospital but pt unable to take anything PO so this medication was stopped  - can provide statin if pt has PO intake   Hypertension, essential  - SBP in 110's this AM   Leukocytosis 2/4 - unclear etiology - also with low grade fevers 99.9 F - CXR with no evidence of PNA - family in agreement to shift more toward comfort care with no further escalation of care   Acute on CKD Stage 3, hyperkalemia  - Baselinecreatinine 1.2-1.4 - now up, no specific nephrotoxic medication on board - renal US suggestive of chronic medical renal disease - K is now WNL - IVF started and Cr is trending down - pt unfortunately unable to take anything PO and as soon as we stop IVF, Cr will likely trend up - shifting more towards comfort care, no further blood work to ensure comfort   Hypernatremia - suspect pre renal etiology, no oral intake   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 EXAM: CT ANGIOGRAPHY  HEAD AND NECK TECHNIQUE: Multidetector CT imaging of the head and neck was performed using the standard protocol during bolus administration of intravenous contrast. Multiplanar CT image reconstructions and MIPs were obtained to evaluate the vascular anatomy. Carotid stenosis measurements (when applicable) are  obtained utilizing NASCET criteria, using the distal internal carotid diameter as the denominator. CONTRAST:  100 cc of Isovue 370.  Prior COMPARISON:  Comparison made with prior noncontrast head CT and MRI/MRA from earlier the same day. FINDINGS: CTA NECK FINDINGS Aortic arch: Visualized aortic arch of normal caliber with normal 3 vessel morphology. Scattered atheromatous plaque within the arch and about the origin of the great vessels without flow-limiting stenosis. Mild atheromatous narrowing at the proximal left subclavian artery of approximately 25% noted. Visualized subclavian arteries otherwise widely patent. Right carotid system: Right common carotid artery tortuous proximally. Mild scattered plaque within the distal common carotid artery without flow-limiting stenosis. Minimal plaque about the right bifurcation without significant narrowing. Right ICA mildly tortuous but widely patent distally to the skullbase without stenosis, dissection, or occlusion. Left carotid system: Left common carotid artery tortuous proximally. Mild scattered plaque within the distal left common carotid artery without significant stenosis. Left ICA patent distally and without stenosis, dissection, or occlusion. Vertebral arteries: Both of the vertebral arteries arise from the subclavian arteries. Focal plaque at multifocal mild to moderate narrowing within the left V2 segment noted. No high-grade flow-limiting stenosis within the vertebral arteries. No evidence for vascular occlusion or occlusion. Skeleton: No acute osseous abnormality. Moderate degenerative spondylolysis noted at C6-7. Visualized osseous structures demonstrate a somewhat mottled appearance without focal worrisome lytic or blastic osseous lesion. Other neck: Visualized soft tissues of the neck demonstrate no acute abnormality. No adenopathy. Thyroid within normal limits. Upper chest: Visualized upper mediastinum within normal limits. Visualized lungs are clear.  Review of the MIP images confirms the above findings CTA HEAD FINDINGS Anterior circulation: Mild scattered plaque within the horizontal petrous segments bilaterally without flow-limiting stenosis. Extensive smooth atheromatous plaque throughout the cavernous/ supraclinoid segments with moderate diffuse narrowing. Right A1 segment dominant and widely patent. There is a small hypoplastic left A1 segment, grossly patent. Anterior communicating artery grossly normal. Abrupt occlusion of proximal left A2 segment (series 502, image 82). Irregularity distal reconstitution with patchy flow seen distally (series 502, image 73). Additionally, there is apparent occlusion of the proximal right A2 segment at the same level (series 502, image 75). Distal reconstitution with attenuated thready flow distally. Short azygos ACA could be considered. No M1 occlusion identified. Moderate stenosis distal right M1 segment. Moderate narrowing mid left M1 segment. The MCA bifurcations within normal limits. Distal MCA branches demonstrate multifocal atheromatous irregularity but are well perfused and fairly symmetric. Posterior circulation: Right V4 segment mildly irregular but patent to the vertebrobasilar junction without high-grade stenosis. Multifocal moderate to severe left V4 stenoses present, most notable just prior to the vertebrobasilar junction. Moderate to advanced narrowing at the vertebrobasilar junction itself. Posterior inferior cerebral arteries patent bilaterally. Basilar artery demonstrates multifocal atheromatous irregularity. Superimposed moderate stenosis of approximately 50% within the mid basilar artery (series 503, image 107). Superior cerebral arteries patent bilaterally. Right PCA mildly irregular but widely patent to its distal aspect. Left P1 segment somewhat hypoplastic. Left PCA patent distally without high-grade stenosis. Venous sinuses: Patent. Anatomic variants: No significant anatomic variant. No aneurysm  or vascular malformation. Delayed phase: Not performed. Review of the MIP images confirms the above findings CT Brain Perfusion Findings: CBF (<30%) Volume: 66mL Perfusion (Tmax>6.0s) volume: 4mL  Mismatch Volume: 11mL Infarction Location:Apparent infarct is located within the parasagittal left frontal lobe, left ACA territory. Surrounding penumbra within the adjacent left ACA territory with additional ischemic penumbra within the right ACA territory. Perfusion abnormality consistent with azygos ACA occlusion seen on prior corresponding CTA. IMPRESSION: 1. Small acute left ACA territory infarct. Surrounding penumbra involving bilateral ACA territories as above. 2. Positive CTA for emergent large vessel occlusion involving probable proximal azygos ACA, with distal reconstitution of bilateral A2 segments distally. Distal flow is irregular and attenuated, particularly on the right. 3. Extensive atheromatous disease throughout the carotid siphons with moderate diffuse narrowing. 4. Extensive atheromatous disease involving the posterior circulation, with multifocal moderate to severe left V4 stenoses, with additional moderate stenosis of the mid basilar artery. 5. Mild atheromatous disease involving the carotid arteries within the neck, with no flow-limiting stenosis identified. 6. Moderate stenosis at the origin of the vertebral arteries bilaterally. Critical Value/emergent results were called by telephone at the time of interpretation on 10/10/2016 at 5:27 pm to Dr. Roland Rack , who verbally acknowledged these results. Electronically Signed   By: Jeannine Boga M.D.   On: 10/10/2016 18:10   Ct Head Wo Contrast  Result Date: 10/10/2016 CLINICAL DATA:  Acute onset aphasia and lower extremity weakness this morning. EXAM: CT HEAD WITHOUT CONTRAST TECHNIQUE: Contiguous axial images were obtained from the base of the skull through the vertex without intravenous contrast. COMPARISON:  08/07/2008 FINDINGS:  Brain: No evidence of hemorrhage, hydrocephalus, extra-axial collection or mass lesion/mass effect. Stable mild cerebral and cerebellar atrophy. Stable appearance of old bilateral cerebellar and high right parietal infarcts. Moderate chronic small vessel disease again noted. Focal decreased attenuation is seen in the right temporoparietal region which was not seen on previous study. This is fairly well defined, and suspicious for a subacute or chronic infarct. No definite acute cerebral infarct identified. Vascular: No hyperdense vessel or unexpected calcification. Skull: Normal. Negative for fracture or focal lesion. Sinuses/Orbits: Chronic right mastoid effusion again noted. Other: None. IMPRESSION: Age-indeterminate right temporoparietal infarct, new since 2009 exam, likely subacute or chronic in age. Consider brain MRI without and with contrast for further evaluation. Diffuse cerebral and cerebellar atrophy, chronic small vessel disease, and old bilateral cerebellar and high right parietal infarcts. Electronically Signed   By: Earle Gell M.D.   On: 10/10/2016 12:09   Ct Angio Neck W Or Wo Contrast  Result Date: 10/10/2016 CLINICAL DATA:  Initial evaluation for acute aphasia. EXAM: CT ANGIOGRAPHY HEAD AND NECK TECHNIQUE: Multidetector CT imaging of the head and neck was performed using the standard protocol during bolus administration of intravenous contrast. Multiplanar CT image reconstructions and MIPs were obtained to evaluate the vascular anatomy. Carotid stenosis measurements (when applicable) are obtained utilizing NASCET criteria, using the distal internal carotid diameter as the denominator. CONTRAST:  100 cc of Isovue 370.  Prior COMPARISON:  Comparison made with prior noncontrast head CT and MRI/MRA from earlier the same day. FINDINGS: CTA NECK FINDINGS Aortic arch: Visualized aortic arch of normal caliber with normal 3 vessel morphology. Scattered atheromatous plaque within the arch and about the  origin of the great vessels without flow-limiting stenosis. Mild atheromatous narrowing at the proximal left subclavian artery of approximately 25% noted. Visualized subclavian arteries otherwise widely patent. Right carotid system: Right common carotid artery tortuous proximally. Mild scattered plaque within the distal common carotid artery without flow-limiting stenosis. Minimal plaque about the right bifurcation without significant narrowing. Right ICA mildly tortuous but widely patent distally to the  skullbase without stenosis, dissection, or occlusion. Left carotid system: Left common carotid artery tortuous proximally. Mild scattered plaque within the distal left common carotid artery without significant stenosis. Left ICA patent distally and without stenosis, dissection, or occlusion. Vertebral arteries: Both of the vertebral arteries arise from the subclavian arteries. Focal plaque at multifocal mild to moderate narrowing within the left V2 segment noted. No high-grade flow-limiting stenosis within the vertebral arteries. No evidence for vascular occlusion or occlusion. Skeleton: No acute osseous abnormality. Moderate degenerative spondylolysis noted at C6-7. Visualized osseous structures demonstrate a somewhat mottled appearance without focal worrisome lytic or blastic osseous lesion. Other neck: Visualized soft tissues of the neck demonstrate no acute abnormality. No adenopathy. Thyroid within normal limits. Upper chest: Visualized upper mediastinum within normal limits. Visualized lungs are clear. Review of the MIP images confirms the above findings CTA HEAD FINDINGS Anterior circulation: Mild scattered plaque within the horizontal petrous segments bilaterally without flow-limiting stenosis. Extensive smooth atheromatous plaque throughout the cavernous/ supraclinoid segments with moderate diffuse narrowing. Right A1 segment dominant and widely patent. There is a small hypoplastic left A1 segment, grossly  patent. Anterior communicating artery grossly normal. Abrupt occlusion of proximal left A2 segment (series 502, image 82). Irregularity distal reconstitution with patchy flow seen distally (series 502, image 73). Additionally, there is apparent occlusion of the proximal right A2 segment at the same level (series 502, image 75). Distal reconstitution with attenuated thready flow distally. Short azygos ACA could be considered. No M1 occlusion identified. Moderate stenosis distal right M1 segment. Moderate narrowing mid left M1 segment. The MCA bifurcations within normal limits. Distal MCA branches demonstrate multifocal atheromatous irregularity but are well perfused and fairly symmetric. Posterior circulation: Right V4 segment mildly irregular but patent to the vertebrobasilar junction without high-grade stenosis. Multifocal moderate to severe left V4 stenoses present, most notable just prior to the vertebrobasilar junction. Moderate to advanced narrowing at the vertebrobasilar junction itself. Posterior inferior cerebral arteries patent bilaterally. Basilar artery demonstrates multifocal atheromatous irregularity. Superimposed moderate stenosis of approximately 50% within the mid basilar artery (series 503, image 107). Superior cerebral arteries patent bilaterally. Right PCA mildly irregular but widely patent to its distal aspect. Left P1 segment somewhat hypoplastic. Left PCA patent distally without high-grade stenosis. Venous sinuses: Patent. Anatomic variants: No significant anatomic variant. No aneurysm or vascular malformation. Delayed phase: Not performed. Review of the MIP images confirms the above findings CT Brain Perfusion Findings: CBF (<30%) Volume: 60mL Perfusion (Tmax>6.0s) volume: 31mL Mismatch Volume: 12mL Infarction Location:Apparent infarct is located within the parasagittal left frontal lobe, left ACA territory. Surrounding penumbra within the adjacent left ACA territory with additional ischemic  penumbra within the right ACA territory. Perfusion abnormality consistent with azygos ACA occlusion seen on prior corresponding CTA. IMPRESSION: 1. Small acute left ACA territory infarct. Surrounding penumbra involving bilateral ACA territories as above. 2. Positive CTA for emergent large vessel occlusion involving probable proximal azygos ACA, with distal reconstitution of bilateral A2 segments distally. Distal flow is irregular and attenuated, particularly on the right. 3. Extensive atheromatous disease throughout the carotid siphons with moderate diffuse narrowing. 4. Extensive atheromatous disease involving the posterior circulation, with multifocal moderate to severe left V4 stenoses, with additional moderate stenosis of the mid basilar artery. 5. Mild atheromatous disease involving the carotid arteries within the neck, with no flow-limiting stenosis identified. 6. Moderate stenosis at the origin of the vertebral arteries bilaterally. Critical Value/emergent results were called by telephone at the time of interpretation on 10/10/2016 at 5:27 pm to  Dr. Roland Rack , who verbally acknowledged these results. Electronically Signed   By: Jeannine Boga M.D.   On: 10/10/2016 18:10   Mr Virgel Paling [id number] Contrast  Result Date: 10/10/2016 CLINICAL DATA:  83 year old female with altered mental status and bilateral lower extremity weakness, but right arm weakness and aphasia suggesting a left hemisphere abnormality. Initial encounter. EXAM: MRA HEAD WITHOUT CONTRAST TECHNIQUE: Angiographic images of the Circle of Willis were obtained using MRA technique without intravenous contrast. COMPARISON:  Brain MRI from today reported separately. Intracranial MRA 08/06/2006. FINDINGS: Study is significantly degraded by motion artifact despite repeated imaging attempts. The basilar artery and ICA siphons are patent. The basilar tip and carotid termini are patent. The left A1 segment is non dominant while the right is  dominant and appears stable. The ACAs distal to the anterior communicating artery are poorly evaluated. Both PCAs appear patent and stable since 2007. Both MCA M1 segments are patent. The right MCA bifurcation and proximal M2 branches appear stable compared to 2007. The left MCA bifurcation also appears patent, and the proximal left M2 branches appear stable except for a questionable flow gap at the origin of the dominant left posterior sylvian division as seen on series 109, image 18. However, the preserved distal flow signal in that vessel seems normal and I suspect this finding is artifact. IMPRESSION: Intracranial MRA is significantly degraded by motion artifact. No strong evidence of emergent large vessel occlusion. This was discussed by telephone with Dr. Roland Rack on 10/10/2016 at 1540 hours. We discussed how an intracranial CTA would be complementary so long as a better quality exam could be obtained. Electronically Signed   By: Genevie Ann M.D.   On: 10/10/2016 15:57   Mr Brain Wo Contrast  Result Date: 10/10/2016 CLINICAL DATA:  83 year old female with altered mental status and bilateral lower extremity weakness, but right arm weakness and aphasia suggesting a left hemisphere abnormality. Initial encounter. EXAM: MRI HEAD WITHOUT CONTRAST TECHNIQUE: Multiplanar, multiecho pulse sequences of the brain and surrounding structures were obtained without intravenous contrast. COMPARISON:  Head CT without contrast 1149 hours today. Brain MRI and intracranial MRA 08/06/2006. FINDINGS: Study is intermittently degraded by motion artifact despite repeated imaging attempts. Brain: Chronic bilateral cerebellar infarcts. Chronic Patchy and confluent bilateral cerebral white matter T2 and FLAIR hyperintensity, including bilateral deep white matter capsule involvement. There is a small area of chronic cortical encephalomalacia in the superior right parietal lobe. Chronic bilateral deep gray matter T2 heterogeneity  appears not significantly changed since 2007. Patchy and confluent pontine T2 heterogeneity also appears not significantly changed. Patchy 2 cm area of restricted diffusion in the posterior right temporal lobe corresponding to the noncontrast CT finding earlier today. Associated T2 and FLAIR hyperintensity without hemorrhage or mass effect. There is also a questionable subcentimeter superimposed posterior right parietal lobe area of restricted diffusion on series 5, image 39. T2 shine through in the bilateral superior frontal gyri white matter. No convincing left side restricted diffusion. No posterior fossa restricted diffusion. No ventriculomegaly. No midline shift, mass effect, or evidence of intracranial mass lesion. Normal basilar cisterns. Grossly negative pituitary and cervicomedullary junction. Vascular: Major intracranial vascular flow voids are not well evaluated but appear grossly stable since 2007. Skull and upper cervical spine: Grossly negative. Sinuses/Orbits: Grossly negative orbits soft tissues. Paranasal sinuses are clear. There are chronic bilateral mastoid effusions which have not significantly changed since 2007. Negative nasopharynx. Other: Negative scalp soft tissues. IMPRESSION: 1. Patchy 2 cm area of right  MCA territory ischemia in the posterior right temporal lobe with no hemorrhage or mass effect. Possible superimposed acute cortical lacune in the right parietal lobe. 2. No other acute ischemia identified, specifically no convincing left side diffusion abnormality. 3. Underlying advanced chronic ischemic disease otherwise appears not significantly changed since 2007. 4. Study discussed by telephone with Dr. Roland Rack at 1540 hours. Electronically Signed   By: Genevie Ann M.D.   On: 10/10/2016 15:50   US Renal  Result Date: 10/13/2016 CLINICAL DATA:  Acute renal failure. EXAM: RENAL / URINARY TRACT ULTRASOUND COMPLETE COMPARISON:  Abdominal ultrasound 11/19/2010 FINDINGS: Right  Kidney: Length: 11.6 cm. No hydronephrosis. There is increased renal echogenicity. At least 3 cystic lesions are seen in the right kidney. Complex cyst measuring 3.4 x 4.3 x 3.3 cm has internal septations with blood flow. There are 2 additional simple cysts 4.3 cm in the lower pole and 6.4 cm in the upper pole. Left Kidney: Length: 10.1 cm. No hydronephrosis. There is increased renal echogenicity. Two simple cysts measure 5.5 and 2.7 cm respectively. Bladder: Appears normal for degree of bladder distention. IMPRESSION: 1. No hydronephrosis. Increased renal echogenicity suggesting chronic medical renal disease. 2. Bilateral renal cysts. One of these cysts on the right is complex with internal septation that demonstrates blood flow. Recommend further characterization with MRI given diminished renal function. Cysts have increased in size from comparison ultrasound 2012. Electronically Signed   By: Jeb Levering M.D.   On: 10/13/2016 02:33   Ct Cerebral Perfusion W Contrast  Result Date: 10/10/2016 CLINICAL DATA:  Initial evaluation for acute aphasia. EXAM: CT ANGIOGRAPHY HEAD AND NECK TECHNIQUE: Multidetector CT imaging of the head and neck was performed using the standard protocol during bolus administration of intravenous contrast. Multiplanar CT image reconstructions and MIPs were obtained to evaluate the vascular anatomy. Carotid stenosis measurements (when applicable) are obtained utilizing NASCET criteria, using the distal internal carotid diameter as the denominator. CONTRAST:  100 cc of Isovue 370.  Prior COMPARISON:  Comparison made with prior noncontrast head CT and MRI/MRA from earlier the same day. FINDINGS: CTA NECK FINDINGS Aortic arch: Visualized aortic arch of normal caliber with normal 3 vessel morphology. Scattered atheromatous plaque within the arch and about the origin of the great vessels without flow-limiting stenosis. Mild atheromatous narrowing at the proximal left subclavian artery of  approximately 25% noted. Visualized subclavian arteries otherwise widely patent. Right carotid system: Right common carotid artery tortuous proximally. Mild scattered plaque within the distal common carotid artery without flow-limiting stenosis. Minimal plaque about the right bifurcation without significant narrowing. Right ICA mildly tortuous but widely patent distally to the skullbase without stenosis, dissection, or occlusion. Left carotid system: Left common carotid artery tortuous proximally. Mild scattered plaque within the distal left common carotid artery without significant stenosis. Left ICA patent distally and without stenosis, dissection, or occlusion. Vertebral arteries: Both of the vertebral arteries arise from the subclavian arteries. Focal plaque at multifocal mild to moderate narrowing within the left V2 segment noted. No high-grade flow-limiting stenosis within the vertebral arteries. No evidence for vascular occlusion or occlusion. Skeleton: No acute osseous abnormality. Moderate degenerative spondylolysis noted at C6-7. Visualized osseous structures demonstrate a somewhat mottled appearance without focal worrisome lytic or blastic osseous lesion. Other neck: Visualized soft tissues of the neck demonstrate no acute abnormality. No adenopathy. Thyroid within normal limits. Upper chest: Visualized upper mediastinum within normal limits. Visualized lungs are clear. Review of the MIP images confirms the above findings CTA HEAD FINDINGS  Anterior circulation: Mild scattered plaque within the horizontal petrous segments bilaterally without flow-limiting stenosis. Extensive smooth atheromatous plaque throughout the cavernous/ supraclinoid segments with moderate diffuse narrowing. Right A1 segment dominant and widely patent. There is a small hypoplastic left A1 segment, grossly patent. Anterior communicating artery grossly normal. Abrupt occlusion of proximal left A2 segment (series 502, image 82).  Irregularity distal reconstitution with patchy flow seen distally (series 502, image 73). Additionally, there is apparent occlusion of the proximal right A2 segment at the same level (series 502, image 75). Distal reconstitution with attenuated thready flow distally. Short azygos ACA could be considered. No M1 occlusion identified. Moderate stenosis distal right M1 segment. Moderate narrowing mid left M1 segment. The MCA bifurcations within normal limits. Distal MCA branches demonstrate multifocal atheromatous irregularity but are well perfused and fairly symmetric. Posterior circulation: Right V4 segment mildly irregular but patent to the vertebrobasilar junction without high-grade stenosis. Multifocal moderate to severe left V4 stenoses present, most notable just prior to the vertebrobasilar junction. Moderate to advanced narrowing at the vertebrobasilar junction itself. Posterior inferior cerebral arteries patent bilaterally. Basilar artery demonstrates multifocal atheromatous irregularity. Superimposed moderate stenosis of approximately 50% within the mid basilar artery (series 503, image 107). Superior cerebral arteries patent bilaterally. Right PCA mildly irregular but widely patent to its distal aspect. Left P1 segment somewhat hypoplastic. Left PCA patent distally without high-grade stenosis. Venous sinuses: Patent. Anatomic variants: No significant anatomic variant. No aneurysm or vascular malformation. Delayed phase: Not performed. Review of the MIP images confirms the above findings CT Brain Perfusion Findings: CBF (<30%) Volume: 77mL Perfusion (Tmax>6.0s) volume: 11mL Mismatch Volume: 40mL Infarction Location:Apparent infarct is located within the parasagittal left frontal lobe, left ACA territory. Surrounding penumbra within the adjacent left ACA territory with additional ischemic penumbra within the right ACA territory. Perfusion abnormality consistent with azygos ACA occlusion seen on prior  corresponding CTA. IMPRESSION: 1. Small acute left ACA territory infarct. Surrounding penumbra involving bilateral ACA territories as above. 2. Positive CTA for emergent large vessel occlusion involving probable proximal azygos ACA, with distal reconstitution of bilateral A2 segments distally. Distal flow is irregular and attenuated, particularly on the right. 3. Extensive atheromatous disease throughout the carotid siphons with moderate diffuse narrowing. 4. Extensive atheromatous disease involving the posterior circulation, with multifocal moderate to severe left V4 stenoses, with additional moderate stenosis of the mid basilar artery. 5. Mild atheromatous disease involving the carotid arteries within the neck, with no flow-limiting stenosis identified. 6. Moderate stenosis at the origin of the vertebral arteries bilaterally. Critical Value/emergent results were called by telephone at the time of interpretation on 10/10/2016 at 5:27 pm to Dr. Roland Rack , who verbally acknowledged these results. Electronically Signed   By: Jeannine Boga M.D.   On: 10/10/2016 18:10   Dg Chest Port 1 View  Result Date: 10/12/2016 CLINICAL DATA:  Dyspnea EXAM: PORTABLE CHEST 1 VIEW COMPARISON:  10/10/2016 FINDINGS: The heart size and mediastinal contours are within normal limits. Both lungs are clear. The visualized skeletal structures are unremarkable. IMPRESSION: No acute abnormality noted. Electronically Signed   By: Inez Catalina M.D.   On: 10/12/2016 18:18   Dg Chest Port 1 View  Result Date: 10/10/2016 CLINICAL DATA:  Altered mental status EXAM: PORTABLE CHEST 1 VIEW COMPARISON:  11/14/2015 FINDINGS: Previous median sternotomy and CABG procedure. Mild cardiac enlargement. Aortic atherosclerosis. There is a small left pleural effusion. No interstitial edema or airspace opacities. IMPRESSION: 1. Small left pleural effusion. Electronically Signed   By: Lovena Le  Clovis Riley M.D.   On: 10/10/2016 11:12     Discharge  Exam: Vitals:   10/14/16 2029 10/14/16 2054  BP: (!) 182/117 126/63  Pulse: (!) 110 (!) 104  Resp:    Temp: 99.6 F (37.6 C)    Vitals:   10/14/16 0452 10/14/16 1453 10/14/16 2029 10/14/16 2054  BP: (!) 141/96 (!) 150/117 (!) 182/117 126/63  Pulse: 93 (!) 112 (!) 110 (!) 104  Resp:      Temp: 98.4 F (36.9 C) 98.6 F (37 C) 99.6 F (37.6 C)   TempSrc: Axillary Axillary Axillary   SpO2: 100% 100% 100%   Weight: 63.7 kg (140 lb 8 oz)     Height:        General: Pt is lethargic, NAD  Cardiovascular: Regular rate and rhythm,  no rubs, no gallops Respiratory: Clear to auscultation bilaterally, no wheezing, no crackles, no rhonchi Abdominal: Soft, non tender, non distended, bowel sounds +, no guarding  Discharge Instructions  Discharge Instructions    Diet - low sodium heart healthy    Complete by:  As directed    Increase activity slowly    Complete by:  As directed      Allergies as of 10/14/2016   No Known Allergies     Medication List    STOP taking these medications   AMARYL 1 MG tablet Generic drug:  glimepiride   donepezil 10 MG tablet Commonly known as:  ARICEPT   ferrous sulfate 325 (65 FE) MG tablet   lisinopril 10 MG tablet Commonly known as:  PRINIVIL,ZESTRIL   magnesium hydroxide 400 MG/5ML suspension Commonly known as:  MILK OF MAGNESIA   memantine 5 MG tablet Commonly known as:  NAMENDA   simvastatin 40 MG tablet Commonly known as:  ZOCOR   sitaGLIPtin 25 MG tablet Commonly known as:  JANUVIA   ZINC OXIDE EX     TAKE these medications   aspirin EC 81 MG tablet Take 81 mg by mouth daily.   morphine CONCENTRATE 10 MG/0.5ML Soln concentrated solution Take 0.25 mLs (5 mg total) by mouth every hour as needed for moderate pain or shortness of breath.      Follow-up Information    Virgie Dad, MD Follow up.   Specialty:  Internal Medicine Contact information: [place] [id number] [phone]             The results of significant diagnostics from this hospitalization (including imaging, microbiology, ancillary and laboratory) are listed below for reference.     Microbiology: Recent Results (from the past 240 hour(s))  Blood culture (routine x 2)     Status: None   Collection Time: 10/10/16 11:35 AM  Result Value Ref Range Status   Specimen Description BLOOD RIGHT HAND  Final   Special Requests BOTTLES DRAWN AEROBIC ONLY 6cc  Final   Culture NO GROWTH 5 DAYS  Final   Report Status 11/04/2016 FINAL  Final  Blood culture (routine x 2)     Status: None   Collection Time: 10/10/16 12:06 PM  Result Value Ref Range Status   Specimen Description BLOOD LEFT ARM  Final   Special Requests BOTTLES DRAWN AEROBIC AND ANAEROBIC 6CC  Final   Culture NO GROWTH 5 DAYS  Final   Report Status 10/28/2016 FINAL  Final  MRSA PCR Screening     Status: None   Collection Time: 10/10/16  8:46 PM  Result Value Ref Range Status   MRSA by PCR NEGATIVE  NEGATIVE Final    Comment:        The GeneXpert MRSA Assay (FDA approved for NASAL specimens only), is one component of a comprehensive MRSA colonization surveillance program. It is not intended to diagnose MRSA infection nor to guide or monitor treatment for MRSA infections.      Labs: Basic Metabolic Panel:  Recent Labs Lab 10/10/16 1107 10/10/16 1131 10/12/16 0524 10/13/16 0207 10/14/16 0350  NA 145 141 144 148* 151*  K 4.2 3.7 5.5* 4.4 3.9  CL 108 106 112* 114* 122*  CO2  --  27 21* 22 22  GLUCOSE 308* 291* 206* 216* 154*  BUN 40* 31* 51* 62* 64*  CREATININE 1.50* 1.36* 2.56* 2.69* 2.20*  CALCIUM  --  10.1 10.1 9.9 9.8   Liver Function Tests:  Recent Labs Lab 10/10/16 1131  AST 27  ALT 27  ALKPHOS 94  BILITOT 0.5  PROT 7.8  ALBUMIN 3.8   No results for input(s): LIPASE, AMYLASE in the last 168 hours.  Recent Labs Lab 10/10/16 1206  AMMONIA 22   CBC:  Recent Labs Lab 10/10/16 1107 10/10/16 1131 10/12/16 0524  10/13/16 0207 10/14/16 0350  WBC  --  6.6 15.0* 12.1* 12.0*  NEUTROABS  --  3.0  --   --   --   HGB 13.3 12.5 12.3 12.0 12.4  HCT 39.0 38.9 38.7 37.7 39.5  MCV  --  88.6 86.6 86.9 87.6  PLT  --  144* 144* 150 154   Cardiac Enzymes:  Recent Labs Lab 10/10/16 1607 10/11/16 0040 10/11/16 1330  TROPONINI 0.10* 0.31* 0.36*   BNP: BNP (last 3 results)  Recent Labs  07/30/16 0500  BNP 115.0*   CBG:  Recent Labs Lab 10/14/16 0450 10/14/16 0806 10/14/16 1220 10/14/16 1648 10/14/16 2031  GLUCAP 162* 179* 148* 136* 149*   SIGNED: Time coordinating discharge:  30 minutes  MAGICK-Monifah Freehling, MD  [facility] 10/20/2016, 1:19 PM Pager [phone]  If 7PM-7AM, please contact night-coverage [email or web address] Password [email or web address]

## 2016-10-15 NOTE — NC FL2 (Signed)
[place] LEVEL OF CARE SCREENING TOOL     IDENTIFICATION  Patient Name: Nancy [name] Birthdate: August 20, 1925 Sex: female Admission Date (Current Location): 10/10/2016  [facility] and [place] Number:  [employer] and Address:  [place], Maish Vaya [address] [place], [place] [address]      Provider Number: [id number]  Attending Physician Name and Address:  Theodis Blaze, MD  Relative Name and Phone Number:  Gwenlyn Fudge [phone]    Current Level of Care: Hospital Recommended Level of Care: [place] Prior Approval Number:    Date Approved/Denied:   PASRR Number:    Discharge Plan: SNF    Current Diagnoses: Patient Active Problem List   Diagnosis Date Noted  . Palliative care by specialist 10/14/2016  . Right hemiparesis ([place]) 10/10/2016  . CKD (chronic kidney disease), stage III 10/10/2016  . Dementia without behavioral disturbance 10/10/2016  . Diabetes mellitus type 2 with complications ([place]) [id number]  . Cerebrovascular accident (CVA) ([place]) 10/10/2016  . Elevated troponin   . Anemia 06/04/2016  . Hearing loss 02/19/2016  . Type 2 diabetes with nephropathy ([place]) 11/26/2015  . Cough 11/19/2015  . Conjunctivitis 08/07/2014  . HTN (hypertension) 04/02/2011  . Hyperlipemia 04/02/2011  . Periorbital cellulitis 04/01/2011  . CLL (chronic lymphocytic leukemia) ([place]) 12/06/2008  . COLONIC POLYPS, RECURRENT 12/06/2008  . DEMENTIA, WITH DEPRESSION 12/06/2008  . CAD (coronary artery disease), native coronary artery 12/06/2008  . Cerebral artery occlusion with cerebral infarction ([place]) 12/06/2008  . DYSPHAGIA DUE TO CEREBROVASCULAR DISEASE 12/06/2008  . GERD 12/06/2008  . Chronic kidney disease 12/06/2008  . History of malignant neoplasm of large intestine 12/06/2008  . PERSONAL HX OF METHICILLIN RESIST STAPH AUREUS 12/06/2008  . HELICOBACTER PYLORI GASTRITIS, HX OF 12/06/2008    Orientation  RESPIRATION BLADDER Height & Weight        O2 (1L) Incontinent Weight: 63.7 kg (140 lb 8 oz) Height:  5\' 5"  (165.1 cm)  BEHAVIORAL SYMPTOMS/MOOD NEUROLOGICAL BOWEL NUTRITION STATUS   (NONE)  (NONE) Incontinent Diet (Clear Liquid)  AMBULATORY STATUS COMMUNICATION OF NEEDS Skin   Extensive Assist Verbally Normal                       Personal Care Assistance Level of Assistance  Total care, Dressing, Feeding, Bathing Bathing Assistance: Maximum assistance Feeding assistance: Limited assistance Dressing Assistance: Maximum assistance Total Care Assistance: Maximum assistance   Functional Limitations Info  Sight, Hearing, Speech Sight Info: Adequate Hearing Info: Adequate Speech Info: Adequate    SPECIAL CARE FACTORS FREQUENCY  PT (By licensed PT), OT (By licensed OT)     PT Frequency: 5x wk OT Frequency: 5x wk            Contractures Contractures Info: Not present    Additional Factors Info  Code Status, Allergies Code Status Info: DNR Allergies Info: NKDA           Current Medications (10/20/2016):  This is the current hospital active medication list Current Facility-Administered Medications  Medication Dose Route Frequency Provider Last Rate Last Dose  .  stroke: mapping our early stages of recovery book   Does not apply Once Shanon Brow Tat, MD      . 0.9 %  sodium chloride infusion   Intravenous Continuous Orson Eva, MD   Stopped at 10/13/16 1234  . acetaminophen (TYLENOL) tablet 650 mg  650 mg Oral Q4H PRN Orson Eva, MD  Or  . acetaminophen (TYLENOL) solution 650 mg  650 mg Per Tube Q4H PRN Orson Eva, MD       Or  . acetaminophen (TYLENOL) suppository 650 mg  650 mg Rectal Q4H PRN Orson Eva, MD   650 mg at 10/14/16 0025  . chlorhexidine (PERIDEX) 0.12 % solution 15 mL  15 mL Mouth Rinse BID Theodis Blaze, MD   15 mL at 10/11/2016 1015  . dextrose 5 % solution   Intravenous Continuous Knox Royalty, NP      . LORazepam (ATIVAN) injection 0.5 mg  0.5 mg  Intravenous Q4H PRN Theodis Blaze, MD   0.5 mg at 10/11/16 1914  . MEDLINE mouth rinse  15 mL Mouth Rinse q12n4p Theodis Blaze, MD   15 mL at 10/14/16 1600  . morphine CONCENTRATE 10 MG/0.5ML oral solution 5 mg  5 mg Oral Q1H PRN Knox Royalty, NP   5 mg at 10/14/16 2109  . white petrolatum (VASELINE) gel   Topical PRN Theodis Blaze, MD         Discharge Medications: Please see discharge summary for a list of discharge medications.  Relevant Imaging Results:  Relevant Lab Results:   Additional Information [phone] The patient will need hospice services order for her at SNF.  Rigoberto Noel, LCSW

## 2016-10-15 NOTE — Care Management Note (Signed)
Case Management Note  Patient Details  Name: Nancy [name] MRN: [id number] Date of Birth: 1924/09/19  Subjective/Objective:  Pt is from [facility]. Positive for Right MCA infarct. Palliative consulting. Plan will e to return to [facility] with [facility]. CSW is assisting with disposition needs.                   Action/Plan: No further needs from CM at this time.   Expected Discharge Date:  10/26/2016               Expected Discharge Plan:  [name] (Plan for d/c to SNF with Hospice Services. )  In-House Referral:  Clinical Social Work  Ship broker  CM Consult  Post Acute Care Choice:  NA Choice offered to:  NA  DME Arranged:  N/A DME Agency:  NA  HH Arranged:  NA HH Agency:  NA  Status of Service:  Completed, signed off  If discussed at [employer] of Stay Meetings, dates discussed:    Additional Comments:  Bethena Roys, RN 10/28/2016, 4:17 PM

## 2016-10-16 ENCOUNTER — Non-Acute Institutional Stay (SKILLED_NURSING_FACILITY): Payer: Medicare Other | Admitting: Internal Medicine

## 2016-10-16 ENCOUNTER — Encounter: Payer: Self-pay | Admitting: Internal Medicine

## 2016-10-16 DIAGNOSIS — I6350 Cerebral infarction due to unspecified occlusion or stenosis of unspecified cerebral artery: Secondary | ICD-10-CM | POA: Diagnosis not present

## 2016-10-16 DIAGNOSIS — E1121 Type 2 diabetes mellitus with diabetic nephropathy: Secondary | ICD-10-CM | POA: Diagnosis not present

## 2016-10-16 DIAGNOSIS — G8191 Hemiplegia, unspecified affecting right dominant side: Secondary | ICD-10-CM

## 2016-10-16 DIAGNOSIS — E785 Hyperlipidemia, unspecified: Secondary | ICD-10-CM

## 2016-10-16 NOTE — Progress Notes (Signed)
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 restricted diffusion. No posterior fossa restricted diffusion. No ventriculomegaly. No midline shift, mass effect, or evidence of intracranial mass lesion. Normal basilar cisterns. Grossly negative pituitary and cervicomedullary junction. Vascular: Major intracranial vascular flow voids are not well evaluated but appear grossly stable since 2007. Skull and upper cervical spine: Grossly negative. Sinuses/Orbits: Grossly negative orbits soft tissues. Paranasal sinuses are clear. There are chronic bilateral mastoid effusions which have not significantly changed since 2007. Negative nasopharynx. Other: Negative scalp soft tissues. IMPRESSION: 1. Patchy 2 cm area of right MCA territory ischemia in the posterior right temporal lobe with no hemorrhage or mass effect. Possible superimposed acute cortical lacune in the right parietal lobe. 2. No other acute ischemia identified, specifically no convincing left side diffusion abnormality. 3. Underlying advanced chronic ischemic disease otherwise appears not significantly changed since 2007. 4. Study discussed by  telephone with Dr. Roland Rack at 1540 hours. Electronically Signed   By: Genevie Ann M.D.   On: 10/10/2016 15:50   US Renal  Result Date: 10/13/2016 CLINICAL DATA:  Acute renal failure. EXAM: RENAL / URINARY TRACT ULTRASOUND COMPLETE COMPARISON:  Abdominal ultrasound 11/19/2010 FINDINGS: Right Kidney: Length: 11.6 cm. No hydronephrosis. There is increased renal echogenicity. At least 3 cystic lesions are seen in the right kidney. Complex cyst measuring 3.4 x 4.3 x 3.3 cm has internal septations with blood flow. There are 2 additional simple cysts 4.3 cm in the lower pole and 6.4 cm in the upper pole. Left Kidney: Length: 10.1 cm. No hydronephrosis. There is increased renal echogenicity. Two simple cysts measure 5.5 and 2.7 cm respectively. Bladder: Appears normal for degree of bladder distention. IMPRESSION: 1. No hydronephrosis. Increased renal echogenicity suggesting chronic medical renal disease. 2. Bilateral renal cysts. One of these cysts on the right is complex with internal septation that demonstrates blood flow. Recommend further characterization with MRI given diminished renal function. Cysts have increased in size from comparison ultrasound 2012. Electronically Signed   By: Jeb Levering M.D.   On: 10/13/2016 02:33   Ct Cerebral Perfusion W Contrast  Result Date: 10/10/2016 CLINICAL DATA:  Initial evaluation for acute aphasia. EXAM: CT ANGIOGRAPHY HEAD AND NECK TECHNIQUE: Multidetector CT imaging of the head and neck was performed using the standard protocol during bolus administration of intravenous contrast. Multiplanar CT image reconstructions and MIPs were obtained to evaluate the vascular anatomy. Carotid stenosis measurements (when applicable) are obtained utilizing NASCET criteria, using the distal internal carotid diameter as the denominator. CONTRAST:  100 cc of Isovue 370.  Prior COMPARISON:  Comparison made with prior noncontrast head CT and MRI/MRA from earlier the same day.  FINDINGS: CTA NECK FINDINGS Aortic arch: Visualized aortic arch of normal caliber with normal 3 vessel morphology. Scattered atheromatous plaque within the arch and about the origin of the great vessels without flow-limiting stenosis. Mild  atheromatous narrowing at the proximal left subclavian artery of approximately 25% noted. Visualized subclavian arteries otherwise widely patent. Right carotid system: Right common carotid artery tortuous proximally. Mild scattered plaque within the distal common carotid artery without flow-limiting stenosis. Minimal plaque about the right bifurcation without significant narrowing. Right ICA mildly tortuous but widely patent distally to the skullbase without stenosis, dissection, or occlusion. Left carotid system: Left common carotid artery tortuous proximally. Mild scattered plaque within the distal left common carotid artery without significant stenosis. Left ICA patent distally and without stenosis, dissection, or occlusion. Vertebral arteries: Both of the vertebral arteries arise from the subclavian arteries. Focal plaque at multifocal mild to moderate narrowing within the left V2 segment noted. No high-grade flow-limiting stenosis within the vertebral arteries. No evidence for vascular occlusion or occlusion. Skeleton: No acute osseous abnormality. Moderate degenerative spondylolysis noted at C6-7. Visualized osseous structures demonstrate a somewhat mottled appearance without focal worrisome lytic or blastic osseous lesion. Other neck: Visualized soft tissues of the neck demonstrate no acute abnormality. No adenopathy. Thyroid within normal limits. Upper chest: Visualized upper mediastinum within normal limits. Visualized lungs are clear. Review of the MIP images confirms the above findings CTA HEAD FINDINGS Anterior circulation: Mild scattered plaque within the horizontal petrous segments bilaterally without flow-limiting stenosis. Extensive smooth atheromatous plaque  throughout the cavernous/ supraclinoid segments with moderate diffuse narrowing. Right A1 segment dominant and widely patent. There is a small hypoplastic left A1 segment, grossly patent. Anterior communicating artery grossly normal. Abrupt occlusion of proximal left A2 segment (series 502, image 82). Irregularity distal reconstitution with patchy flow seen distally (series 502, image 73). Additionally, there is apparent occlusion of the proximal right A2 segment at the same level (series 502, image 75). Distal reconstitution with attenuated thready flow distally. Short azygos ACA could be considered. No M1 occlusion identified. Moderate stenosis distal right M1 segment. Moderate narrowing mid left M1 segment. The MCA bifurcations within normal limits. Distal MCA branches demonstrate multifocal atheromatous irregularity but are well perfused and fairly symmetric. Posterior circulation: Right V4 segment mildly irregular but patent to the vertebrobasilar junction without high-grade stenosis. Multifocal moderate to severe left V4 stenoses present, most notable just prior to the vertebrobasilar junction. Moderate to advanced narrowing at the vertebrobasilar junction itself. Posterior inferior cerebral arteries patent bilaterally. Basilar artery demonstrates multifocal atheromatous irregularity. Superimposed moderate stenosis of approximately 50% within the mid basilar artery (series 503, image 107). Superior cerebral arteries patent bilaterally. Right PCA mildly irregular but widely patent to its distal aspect. Left P1 segment somewhat hypoplastic. Left PCA patent distally without high-grade stenosis. Venous sinuses: Patent. Anatomic variants: No significant anatomic variant. No aneurysm or vascular malformation. Delayed phase: Not performed. Review of the MIP images confirms the above findings CT Brain Perfusion Findings: CBF (<30%) Volume: 69mL Perfusion (Tmax>6.0s) volume: 27mL Mismatch Volume: 63mL Infarction  Location:Apparent infarct is located within the parasagittal left frontal lobe, left ACA territory. Surrounding penumbra within the adjacent left ACA territory with additional ischemic penumbra within the right ACA territory. Perfusion abnormality consistent with azygos ACA occlusion seen on prior corresponding CTA. IMPRESSION: 1. Small acute left ACA territory infarct. Surrounding penumbra involving bilateral ACA territories as above. 2. Positive CTA for emergent large vessel occlusion involving probable proximal azygos ACA, with distal reconstitution of bilateral A2 segments distally. Distal flow is irregular and attenuated, particularly on the right. 3. Extensive atheromatous disease throughout the carotid siphons with moderate diffuse narrowing. 4. Extensive atheromatous disease involving the posterior circulation, with multifocal moderate to  severe left V4 stenoses, with additional moderate stenosis of the mid basilar artery. 5. Mild atheromatous disease involving the carotid arteries within the neck, with no flow-limiting stenosis identified. 6. Moderate stenosis at the origin of the vertebral arteries bilaterally. Critical Value/emergent results were called by telephone at the time of interpretation on 10/10/2016 at 5:27 pm to Dr. Roland Rack , who verbally acknowledged these results. Electronically Signed   By: Jeannine Boga M.D.   On: 10/10/2016 18:10   Dg Chest Port 1 View  Result Date: 10/12/2016 CLINICAL DATA:  Dyspnea EXAM: PORTABLE CHEST 1 VIEW COMPARISON:  10/10/2016 FINDINGS: The heart size and mediastinal contours are within normal limits. Both lungs are clear. The visualized skeletal structures are unremarkable. IMPRESSION: No acute abnormality noted. Electronically Signed   By: Inez Catalina M.D.   On: 10/12/2016 18:18   Dg Chest Port 1 View  Result Date: 10/10/2016 CLINICAL DATA:  Altered mental status EXAM: PORTABLE CHEST 1 VIEW COMPARISON:  11/14/2015 FINDINGS: Previous median  sternotomy and CABG procedure. Mild cardiac enlargement. Aortic atherosclerosis. There is a small left pleural effusion. No interstitial edema or airspace opacities. IMPRESSION: 1. Small left pleural effusion. Electronically Signed   By: Kerby Moors M.D.   On: 10/10/2016 11:12    Assessment/Plan  #1--history CVA--continues with significant neurologic deficits as noted above-she is not really taking anything by mouth and is not verbal-she is now on her comfort care status and actually is being seen by hospice-she essentially is only on morphine at this point-her pain appears to be controlled she does not appear to be in any discomfort but this will have to be watched.  Regards to her other medical issues including leukocytosis-acute on chronic kidney disease-hypertension-hyperlipidemia---type 2 diabetes she is not on any further medications secondary to comfort care status and no aggressive interventions are desired   Her prognosis continues to be quite poor-I did have an extensive discussion with her responsible party her niece at bedside-about her prognosis and level of care-again she does not want aggressive interventions but does want her aunt Comfortable.  At this point appears to be the case and again she will be followed by hospice as well.  [id number] note greater than 35 minutes spent assessing patient-reviewing her chart-discussing her status with nursing staff as well as with her responsible party-and coordinating plan of care--      Oralia Manis, [place]

## 2016-11-07 DEATH — deceased
